# Patient Record
Sex: Female | Born: 1945 | Race: Black or African American | Hispanic: No | Marital: Married | Smoking: Former smoker
Health system: Southern US, Community
[De-identification: ages and names within clinical notes are randomized; demographics above are authoritative.]

## PROBLEM LIST (undated history)

## (undated) DIAGNOSIS — I10 Essential (primary) hypertension: Secondary | ICD-10-CM

## (undated) DIAGNOSIS — F329 Major depressive disorder, single episode, unspecified: Secondary | ICD-10-CM

## (undated) DIAGNOSIS — E559 Vitamin D deficiency, unspecified: Secondary | ICD-10-CM

## (undated) DIAGNOSIS — N390 Urinary tract infection, site not specified: Secondary | ICD-10-CM

## (undated) DIAGNOSIS — J302 Other seasonal allergic rhinitis: Secondary | ICD-10-CM

## (undated) DIAGNOSIS — J309 Allergic rhinitis, unspecified: Secondary | ICD-10-CM

## (undated) DIAGNOSIS — M329 Systemic lupus erythematosus, unspecified: Secondary | ICD-10-CM

## (undated) DIAGNOSIS — K219 Gastro-esophageal reflux disease without esophagitis: Secondary | ICD-10-CM

## (undated) DIAGNOSIS — M1990 Unspecified osteoarthritis, unspecified site: Secondary | ICD-10-CM

## (undated) DIAGNOSIS — N329 Bladder disorder, unspecified: Secondary | ICD-10-CM

## (undated) DIAGNOSIS — D649 Anemia, unspecified: Secondary | ICD-10-CM

## (undated) DIAGNOSIS — C801 Malignant (primary) neoplasm, unspecified: Secondary | ICD-10-CM

## (undated) DIAGNOSIS — F32A Depression, unspecified: Secondary | ICD-10-CM

## (undated) DIAGNOSIS — K760 Fatty (change of) liver, not elsewhere classified: Secondary | ICD-10-CM

## (undated) HISTORY — DX: Bladder disorder, unspecified: N32.9

## (undated) HISTORY — PX: ABDOMINAL HYSTERECTOMY: SHX81

## (undated) HISTORY — DX: Gastro-esophageal reflux disease without esophagitis: K21.9

## (undated) HISTORY — DX: Fatty (change of) liver, not elsewhere classified: K76.0

## (undated) HISTORY — DX: Malignant (primary) neoplasm, unspecified: C80.1

## (undated) HISTORY — DX: Allergic rhinitis, unspecified: J30.9

## (undated) HISTORY — DX: Unspecified osteoarthritis, unspecified site: M19.90

## (undated) HISTORY — PX: WHIPPLE PROCEDURE: SHX2667

## (undated) HISTORY — DX: Systemic lupus erythematosus, unspecified: M32.9

## (undated) HISTORY — DX: Other seasonal allergic rhinitis: J30.2

## (undated) HISTORY — DX: Anemia, unspecified: D64.9

## (undated) HISTORY — DX: Vitamin D deficiency, unspecified: E55.9

---
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
## 1998-04-03 ENCOUNTER — Ambulatory Visit (HOSPITAL_COMMUNITY): Admission: RE | Admit: 1998-04-03 | Discharge: 1998-04-03 | Payer: Self-pay | Admitting: Obstetrics

## 2000-03-24 ENCOUNTER — Other Ambulatory Visit: Admission: RE | Admit: 2000-03-24 | Discharge: 2000-03-24 | Payer: Self-pay | Admitting: Obstetrics

## 2000-04-02 ENCOUNTER — Ambulatory Visit (HOSPITAL_COMMUNITY): Admission: RE | Admit: 2000-04-02 | Discharge: 2000-04-02 | Payer: Self-pay | Admitting: Obstetrics

## 2000-04-02 ENCOUNTER — Encounter: Payer: Self-pay | Admitting: Obstetrics

## 2001-05-13 ENCOUNTER — Encounter: Payer: Self-pay | Admitting: Obstetrics

## 2001-05-13 ENCOUNTER — Ambulatory Visit (HOSPITAL_COMMUNITY): Admission: RE | Admit: 2001-05-13 | Discharge: 2001-05-13 | Payer: Self-pay | Admitting: Obstetrics

## 2002-05-19 ENCOUNTER — Ambulatory Visit (HOSPITAL_COMMUNITY): Admission: RE | Admit: 2002-05-19 | Discharge: 2002-05-19 | Payer: Self-pay | Admitting: Obstetrics

## 2002-05-19 ENCOUNTER — Encounter: Payer: Self-pay | Admitting: Obstetrics

## 2003-08-08 ENCOUNTER — Encounter: Payer: Self-pay | Admitting: Obstetrics

## 2003-08-08 ENCOUNTER — Ambulatory Visit (HOSPITAL_COMMUNITY): Admission: RE | Admit: 2003-08-08 | Discharge: 2003-08-08 | Payer: Self-pay | Admitting: Obstetrics

## 2004-08-08 ENCOUNTER — Ambulatory Visit (HOSPITAL_COMMUNITY): Admission: RE | Admit: 2004-08-08 | Discharge: 2004-08-08 | Payer: Self-pay | Admitting: Obstetrics

## 2004-08-27 ENCOUNTER — Ambulatory Visit: Payer: Self-pay | Admitting: Gastroenterology

## 2005-01-18 ENCOUNTER — Ambulatory Visit (HOSPITAL_COMMUNITY): Admission: RE | Admit: 2005-01-18 | Discharge: 2005-01-18 | Payer: Self-pay | Admitting: Urology

## 2005-01-18 ENCOUNTER — Ambulatory Visit (HOSPITAL_BASED_OUTPATIENT_CLINIC_OR_DEPARTMENT_OTHER): Admission: RE | Admit: 2005-01-18 | Discharge: 2005-01-18 | Payer: Self-pay | Admitting: Urology

## 2005-01-23 ENCOUNTER — Encounter: Admission: RE | Admit: 2005-01-23 | Discharge: 2005-01-23 | Payer: Self-pay | Admitting: Urology

## 2005-08-09 ENCOUNTER — Ambulatory Visit (HOSPITAL_COMMUNITY): Admission: RE | Admit: 2005-08-09 | Discharge: 2005-08-09 | Payer: Self-pay | Admitting: Obstetrics

## 2005-09-05 ENCOUNTER — Encounter: Admission: RE | Admit: 2005-09-05 | Discharge: 2005-09-05 | Payer: Self-pay | Admitting: Neurology

## 2005-09-18 ENCOUNTER — Encounter: Admission: RE | Admit: 2005-09-18 | Discharge: 2005-09-18 | Payer: Self-pay | Admitting: Neurology

## 2006-08-26 ENCOUNTER — Ambulatory Visit (HOSPITAL_COMMUNITY): Admission: RE | Admit: 2006-08-26 | Discharge: 2006-08-26 | Payer: Self-pay | Admitting: Obstetrics

## 2007-09-16 ENCOUNTER — Ambulatory Visit (HOSPITAL_COMMUNITY): Admission: RE | Admit: 2007-09-16 | Discharge: 2007-09-16 | Payer: Self-pay | Admitting: Obstetrics

## 2008-09-22 ENCOUNTER — Ambulatory Visit (HOSPITAL_COMMUNITY): Admission: RE | Admit: 2008-09-22 | Discharge: 2008-09-22 | Payer: Self-pay | Admitting: Obstetrics

## 2008-09-28 ENCOUNTER — Encounter: Admission: RE | Admit: 2008-09-28 | Discharge: 2008-09-28 | Payer: Self-pay | Admitting: Obstetrics

## 2009-09-27 ENCOUNTER — Ambulatory Visit (HOSPITAL_COMMUNITY): Admission: RE | Admit: 2009-09-27 | Discharge: 2009-09-27 | Payer: Self-pay | Admitting: Obstetrics

## 2010-10-01 ENCOUNTER — Ambulatory Visit (HOSPITAL_COMMUNITY)
Admission: RE | Admit: 2010-10-01 | Discharge: 2010-10-01 | Payer: Self-pay | Source: Home / Self Care | Attending: Internal Medicine | Admitting: Internal Medicine

## 2010-10-21 HISTORY — PX: ROTATOR CUFF REPAIR: SHX139

## 2010-11-11 ENCOUNTER — Encounter: Payer: Self-pay | Admitting: Obstetrics

## 2011-02-10 ENCOUNTER — Inpatient Hospital Stay (INDEPENDENT_AMBULATORY_CARE_PROVIDER_SITE_OTHER)
Admission: RE | Admit: 2011-02-10 | Discharge: 2011-02-10 | Disposition: A | Discharge status: Home / Self Care | Payer: BC Managed Care – PPO | Source: Ambulatory Visit | Attending: Emergency Medicine | Admitting: Emergency Medicine

## 2011-02-10 DIAGNOSIS — S60459A Superficial foreign body of unspecified finger, initial encounter: Secondary | ICD-10-CM

## 2011-03-08 NOTE — Op Note (Signed)
Kimberly Vazquez, Kimberly Vazquez            ACCOUNT NO.:  [id number]   MEDICAL RECORD NO.:  [id number]          PATIENT TYPE:  AMB   LOCATION:  [facility]                         FACILITY:  [facility]   PHYSICIAN:  Lindaann Slough, M.D.  DATE OF BIRTH:  [birth year]   DATE OF PROCEDURE:  01/18/2005  DATE OF DISCHARGE:                                 OPERATIVE REPORT   PREOPERATIVE DIAGNOSIS:  Urinary retention.   POSTOPERATIVE DIAGNOSIS:  Urinary retention.   OPERATIVE PROCEDURE:  Cystoscopy and meatal dilation.   SURGEON:  Lindaann Slough, M.D.   ANESTHESIA:  General.   INDICATIONS FOR PROCEDURE:  The patient is a 65 year old female who had been  complaining of frequency and voiding small amount of urine at a time.  Pelvic ultrasound done at her gynecologist's office showed a distended  bladder.  She was seen in the office and catheterized for 350 mL of urine.  BUN and creatinine are normal.  Renal ultrasound shows normal kidneys.  Then, she returned to the office two days later and was still having the  same symptoms.  Bladder ultrasound then showed a postvoid residual of 1000  mL, and she was then catheterized for 1200 mL of urine.  She is scheduled  today for cystoscopy and meatal dilation.   DESCRIPTION OF PROCEDURE:  Under general anesthesia, the patient was prepped  and draped and placed in the dorsal lithotomy position.  A #22 [place]  cystoscope was inserted in the bladder.  The bladder mucosa is normal.  There is no stone or tumor in the bladder.  The ureteral orifices are in  normal position and shape with clear efflux.  There is no evidence of  submucosal hemorrhage.  The cystoscope was then removed.  The urethra was  then dilated with #32 [place].   Bimanual examination showed no evidence of pelvic mass.  She is status post  hysterectomy.  There is no adnexal mass.  On rectal examination, there is no  rectal mass.   The patient tolerated the procedure well and left the OR in  satisfactory  condition to post anesthesia care unit.   PLAN:  Plan is to have the patient return to the office in two days to check  her postvoid residual.  We will also start her on Urecholine 50 mg four  times a day.  If postvoid residual urine is still elevated, we will teach  her how to do in-and-out catheterization and we would then do video  urodynamic studies.      MN/MEDQ  D:  01/18/2005  T:  01/18/2005  Job:  [id number]   cc:   Margaretmary Bayley, M.D.  [address], [address]  [place]  [place] [address]  Fax: [phone]   Kathreen Cosier, M.D.  [address]  [place]  [place] [address]  Fax: [phone]

## 2011-09-02 ENCOUNTER — Other Ambulatory Visit (HOSPITAL_COMMUNITY): Payer: Self-pay | Admitting: Internal Medicine

## 2011-09-02 DIAGNOSIS — Z1231 Encounter for screening mammogram for malignant neoplasm of breast: Secondary | ICD-10-CM

## 2011-10-03 ENCOUNTER — Ambulatory Visit (HOSPITAL_COMMUNITY)
Admission: RE | Admit: 2011-10-03 | Discharge: 2011-10-03 | Disposition: A | Discharge status: Home / Self Care | Payer: Medicare Other | Source: Ambulatory Visit | Attending: Internal Medicine | Admitting: Internal Medicine

## 2011-10-03 DIAGNOSIS — Z1231 Encounter for screening mammogram for malignant neoplasm of breast: Secondary | ICD-10-CM | POA: Insufficient documentation

## 2011-10-08 ENCOUNTER — Other Ambulatory Visit: Payer: Self-pay | Admitting: Internal Medicine

## 2011-10-08 ENCOUNTER — Encounter: Payer: Self-pay | Admitting: Gastroenterology

## 2011-10-08 ENCOUNTER — Ambulatory Visit
Admission: RE | Admit: 2011-10-08 | Discharge: 2011-10-08 | Disposition: A | Discharge status: Home / Self Care | Payer: Medicare Other | Source: Ambulatory Visit | Attending: Internal Medicine | Admitting: Internal Medicine

## 2011-10-08 DIAGNOSIS — J029 Acute pharyngitis, unspecified: Secondary | ICD-10-CM

## 2011-10-08 DIAGNOSIS — R059 Cough, unspecified: Secondary | ICD-10-CM

## 2011-10-08 DIAGNOSIS — R05 Cough: Secondary | ICD-10-CM

## 2012-04-10 ENCOUNTER — Other Ambulatory Visit: Payer: Self-pay | Admitting: *Deleted

## 2012-09-22 ENCOUNTER — Other Ambulatory Visit (HOSPITAL_COMMUNITY): Payer: Self-pay | Admitting: Internal Medicine

## 2012-09-22 DIAGNOSIS — Z1231 Encounter for screening mammogram for malignant neoplasm of breast: Secondary | ICD-10-CM

## 2012-10-09 ENCOUNTER — Ambulatory Visit (HOSPITAL_COMMUNITY)
Admission: RE | Admit: 2012-10-09 | Discharge: 2012-10-09 | Disposition: A | Discharge status: Home / Self Care | Payer: Medicare Other | Source: Ambulatory Visit | Attending: Internal Medicine | Admitting: Internal Medicine

## 2012-10-09 DIAGNOSIS — Z1231 Encounter for screening mammogram for malignant neoplasm of breast: Secondary | ICD-10-CM

## 2013-10-09 ENCOUNTER — Encounter (HOSPITAL_COMMUNITY): Payer: Self-pay | Admitting: Emergency Medicine

## 2013-10-09 ENCOUNTER — Emergency Department (HOSPITAL_COMMUNITY)
Admission: EM | Admit: 2013-10-09 | Discharge: 2013-10-09 | Disposition: A | Discharge status: Home / Self Care | Payer: PRIVATE HEALTH INSURANCE | Attending: Emergency Medicine | Admitting: Emergency Medicine

## 2013-10-09 DIAGNOSIS — Z7982 Long term (current) use of aspirin: Secondary | ICD-10-CM | POA: Insufficient documentation

## 2013-10-09 DIAGNOSIS — N390 Urinary tract infection, site not specified: Secondary | ICD-10-CM | POA: Insufficient documentation

## 2013-10-09 DIAGNOSIS — I10 Essential (primary) hypertension: Secondary | ICD-10-CM | POA: Insufficient documentation

## 2013-10-09 DIAGNOSIS — Z79899 Other long term (current) drug therapy: Secondary | ICD-10-CM | POA: Insufficient documentation

## 2013-10-09 HISTORY — DX: Urinary tract infection, site not specified: N39.0

## 2013-10-09 HISTORY — DX: Essential (primary) hypertension: I10

## 2013-10-09 LAB — CBC WITH DIFFERENTIAL/PLATELET
Basophils Relative: 0 % (ref 0–1)
Eosinophils Absolute: 0 10*3/uL (ref 0.0–0.7)
Eosinophils Relative: 0 % (ref 0–5)
HCT: 41.7 % (ref 36.0–46.0)
MCHC: 34.1 g/dL (ref 30.0–36.0)
MCV: 80 fL (ref 78.0–100.0)
Monocytes Relative: 6 % (ref 3–12)
Neutro Abs: 6.1 10*3/uL (ref 1.7–7.7)
Neutrophils Relative %: 78 % — ABNORMAL HIGH (ref 43–77)
Platelets: 265 10*3/uL (ref 150–400)
RBC: 5.21 MIL/uL — ABNORMAL HIGH (ref 3.87–5.11)
RDW: 14.2 % (ref 11.5–15.5)

## 2013-10-09 LAB — COMPREHENSIVE METABOLIC PANEL
ALT: 318 U/L — ABNORMAL HIGH (ref 0–35)
AST: 243 U/L — ABNORMAL HIGH (ref 0–37)
Alkaline Phosphatase: 107 U/L (ref 39–117)
BUN: 35 mg/dL — ABNORMAL HIGH (ref 6–23)
CO2: 27 mEq/L (ref 19–32)
Calcium: 9.8 mg/dL (ref 8.4–10.5)
Creatinine, Ser: 1.69 mg/dL — ABNORMAL HIGH (ref 0.50–1.10)
GFR calc Af Amer: 35 mL/min — ABNORMAL LOW (ref >90)
Sodium: 135 mEq/L (ref 135–145)
Total Bilirubin: 0.9 mg/dL (ref 0.3–1.2)
Total Protein: 8.5 g/dL — ABNORMAL HIGH (ref 6.0–8.3)

## 2013-10-09 LAB — URINALYSIS, ROUTINE W REFLEX MICROSCOPIC
Protein, ur: NEGATIVE mg/dL
Specific Gravity, Urine: 1.018 (ref 1.005–1.030)
Urobilinogen, UA: 0.2 mg/dL (ref 0.0–1.0)

## 2013-10-09 LAB — LIPASE, BLOOD: Lipase: 13 U/L (ref 11–59)

## 2013-10-09 LAB — URINE MICROSCOPIC-ADD ON

## 2013-10-09 MED ORDER — DEXTROSE 5 % IV SOLN
1.0000 g | Freq: Once | INTRAVENOUS | Status: AC
  Administered 2013-10-09: 1 g via INTRAVENOUS
  Filled 2013-10-09: qty 10

## 2013-10-09 MED ORDER — CEPHALEXIN 500 MG PO CAPS: 500.0000 mg | ORAL_CAPSULE | Freq: Three times a day (TID) | ORAL | Status: DC

## 2013-10-09 MED ORDER — ONDANSETRON HCL 4 MG PO TABS: 4.0000 mg | ORAL_TABLET | Freq: Three times a day (TID) | ORAL | Status: DC | PRN

## 2013-10-09 MED ORDER — ONDANSETRON HCL 4 MG/2ML IJ SOLN
4.0000 mg | Freq: Once | INTRAMUSCULAR | Status: AC
  Administered 2013-10-09: 4 mg via INTRAVENOUS
  Filled 2013-10-09: qty 2

## 2013-10-09 MED ORDER — SODIUM CHLORIDE 0.9 % IV BOLUS (SEPSIS)
1000.0000 mL | Freq: Once | INTRAVENOUS | Status: AC
  Administered 2013-10-09: 1000 mL via INTRAVENOUS

## 2013-10-09 NOTE — ED Notes (Signed)
Pt reports mid abd pain and n/v/d x 3 days. Reports recent UTI but reports doing self caths at home.

## 2013-10-09 NOTE — ED Notes (Signed)
Self-cath kit provided to patient.

## 2013-10-09 NOTE — ED Provider Notes (Signed)
CSN: [id number]     Arrival date & time 10/09/13  1310 History   First MD Initiated Contact with Patient 10/09/13 1409     Chief Complaint  Patient presents with  . Abdominal Pain   (Consider location/radiation/quality/duration/timing/severity/associated sxs/prior Treatment) Patient is a 67 y.o. female presenting with abdominal pain.  Abdominal Pain  Pt with history of 'lazy bladder' who self-caths at home and recent treated for UTI by PCP, states she has had 2 days of lower abdominal burning, nausea, general malaise and poor appetite. She denies any diarrhea. She also recently diagnosed with 'fatty liver' but not on any specific treatment for same.   Past Medical History  Diagnosis Date  . UTI (urinary tract infection)   . Hypertension    History reviewed. No pertinent past surgical history. History reviewed. No pertinent family history. History  Substance Use Topics  . Smoking status: Not on file  . Smokeless tobacco: Not on file  . Alcohol Use: No   OB History   Grav Para Term Preterm Abortions TAB SAB Ect Mult Living                 Review of Systems  Gastrointestinal: Positive for abdominal pain.   All other systems reviewed and are negative except as noted in HPI.   Allergies  Review of patient's allergies indicates no known allergies.  Home Medications   Current Outpatient Rx  Name  Route  Sig  Dispense  Refill  . aspirin 81 MG tablet   Oral   Take 81 mg by mouth daily.         . Cholecalciferol (VITAMIN D) 2000 UNITS CAPS   Oral   Take 1 capsule by mouth daily.         Marland Kitchen losartan (COZAAR) 100 MG tablet   Oral   Take 100 mg by mouth daily.          . nitrofurantoin (MACRODANTIN) 100 MG capsule   Oral   Take 100 mg by mouth daily.         . phentermine (ADIPEX-P) 37.5 MG tablet   Oral   Take 37.5 mg by mouth daily.          Marland Kitchen triamterene-hydrochlorothiazide (MAXZIDE-25) 37.5-25 MG per tablet   Oral   Take 1 tablet by mouth daily.          BP 142/78  Pulse 78  Temp(Src) 98.6 F (37 C) (Oral)  Resp 16  SpO2 95% Physical Exam  Nursing note and vitals reviewed. Constitutional: She is oriented to person, place, and time. She appears well-developed and well-nourished.  HENT:  Head: Normocephalic and atraumatic.  Eyes: EOM are normal. Pupils are equal, round, and reactive to light.  Neck: Normal range of motion. Neck supple.  Cardiovascular: Normal rate, normal heart sounds and intact distal pulses.   Pulmonary/Chest: Effort normal and breath sounds normal.  Abdominal: Bowel sounds are normal. She exhibits no distension. There is tenderness (lower abdominal, mild). There is no rebound and no guarding.  Musculoskeletal: Normal range of motion. She exhibits no edema and no tenderness.  Neurological: She is alert and oriented to person, place, and time. She has normal strength. No cranial nerve deficit or sensory deficit.  Skin: Skin is warm and dry. No rash noted.  Psychiatric: She has a normal mood and affect.    ED Course  Procedures (including critical care time) Labs Review Labs Reviewed  CBC WITH DIFFERENTIAL - Abnormal; Notable for the following:  RBC 5.21 (*)    Neutrophils Relative % 78 (*)    All other components within normal limits  COMPREHENSIVE METABOLIC PANEL - Abnormal; Notable for the following:    Glucose, Bld 103 (*)    BUN 35 (*)    Creatinine, Ser 1.69 (*)    Total Protein 8.5 (*)    AST 243 (*)    ALT 318 (*)    GFR calc non Af Amer 30 (*)    GFR calc Af Amer 35 (*)    All other components within normal limits  URINALYSIS, ROUTINE W REFLEX MICROSCOPIC - Abnormal; Notable for the following:    APPearance CLOUDY (*)    Hgb urine dipstick SMALL (*)    Nitrite POSITIVE (*)    Leukocytes, UA SMALL (*)    All other components within normal limits  URINE MICROSCOPIC-ADD ON - Abnormal; Notable for the following:    Bacteria, UA MANY (*)    Casts GRANULAR CAST (*)    All other components  within normal limits  URINE CULTURE  LIPASE, BLOOD   Imaging Review No results found.  EKG Interpretation   None       MDM   1. UTI (urinary tract infection)     Labs consistent with UTI, mildly elevated LFTs consistent with reported 'fatty liver'. Feeling much better after IVF and Zofran. Will give Rocephin, treat with Keflex, send UA for culture. ADvised PCP followup.     Rufus Cypert B. Bernette Mayers, MD 10/09/13 [id number]

## 2013-10-12 LAB — URINE CULTURE

## 2013-10-18 ENCOUNTER — Other Ambulatory Visit (HOSPITAL_COMMUNITY): Payer: Self-pay | Admitting: Internal Medicine

## 2013-10-18 DIAGNOSIS — Z1231 Encounter for screening mammogram for malignant neoplasm of breast: Secondary | ICD-10-CM

## 2013-10-28 ENCOUNTER — Ambulatory Visit (HOSPITAL_COMMUNITY)
Admission: RE | Admit: 2013-10-28 | Discharge: 2013-10-28 | Disposition: A | Discharge status: Home / Self Care | Payer: PRIVATE HEALTH INSURANCE | Source: Ambulatory Visit | Attending: Internal Medicine | Admitting: Internal Medicine

## 2013-10-28 DIAGNOSIS — Z1231 Encounter for screening mammogram for malignant neoplasm of breast: Secondary | ICD-10-CM

## 2013-11-10 ENCOUNTER — Encounter: Payer: Self-pay | Admitting: Gastroenterology

## 2013-11-12 ENCOUNTER — Telehealth: Payer: Self-pay | Admitting: Gastroenterology

## 2013-11-12 NOTE — Telephone Encounter (Signed)
Rec'd from [facility] forward 21 pages to Dr.Stark

## 2013-12-01 ENCOUNTER — Ambulatory Visit (INDEPENDENT_AMBULATORY_CARE_PROVIDER_SITE_OTHER): Payer: Medicare Other | Admitting: Gastroenterology

## 2013-12-01 ENCOUNTER — Encounter: Payer: Self-pay | Admitting: Gastroenterology

## 2013-12-01 VITALS — BP 118/64 | HR 70 | Ht 61.5 in | Wt 186.6 lb

## 2013-12-01 DIAGNOSIS — Z1211 Encounter for screening for malignant neoplasm of colon: Secondary | ICD-10-CM

## 2013-12-01 DIAGNOSIS — K5900 Constipation, unspecified: Secondary | ICD-10-CM

## 2013-12-01 MED ORDER — PEG-KCL-NACL-NASULF-NA ASC-C 100 G PO SOLR: 1.0000 | Freq: Once | ORAL | Status: DC

## 2013-12-01 NOTE — Progress Notes (Signed)
History of Present Illness: This is a 68 year old female who has occasional problems with constipation. Recently she began increasing her water intake and using a stool softener and this has helped. She had problems with lower abdominal pain in December and was found to have UTI which was treated and her pain resolved. She previously underwent colonoscopy in November 2005 showing diverticulosis and a small hyperplastic colon polyp. Denies weight loss, diarrhea, change in stool caliber, melena, hematochezia, nausea, vomiting, dysphagia, reflux symptoms, chest pain.  No Known Allergies Outpatient Prescriptions Prior to Visit  Medication Sig Dispense Refill  . losartan (COZAAR) 100 MG tablet Take 100 mg by mouth daily.       . phentermine (ADIPEX-P) 37.5 MG tablet Take 37.5 mg by mouth daily.       Marland Kitchen triamterene-hydrochlorothiazide (MAXZIDE-25) 37.5-25 MG per tablet Take 1 tablet by mouth daily.      Marland Kitchen aspirin 81 MG tablet Take 81 mg by mouth daily.      . cephALEXin (KEFLEX) 500 MG capsule Take 1 capsule (500 mg total) by mouth 3 (three) times daily.  21 capsule  0  . Cholecalciferol (VITAMIN D) 2000 UNITS CAPS Take 1 capsule by mouth daily.      . nitrofurantoin (MACRODANTIN) 100 MG capsule Take 100 mg by mouth daily.      . ondansetron (ZOFRAN) 4 MG tablet Take 1 tablet (4 mg total) by mouth every 8 (eight) hours as needed for nausea or vomiting.  12 tablet  0   No facility-administered medications prior to visit.   Past Medical History  Diagnosis Date  . UTI (urinary tract infection)   . Hypertension   . Arthritis   . Vitamin D deficiency   . Allergic rhinitis   . Fatty liver   . Anemia   . Bladder disorder     "lazy" , caths twice a day   Past Surgical History  Procedure Laterality Date  . Abdominal hysterectomy    . Thyroidectomy     History   Social History  . Marital Status: Married    Spouse Name: N/A    Number of Children: 2  . Years of Education: N/A    Occupational History  . Retired [employer] Professor    Social History Main Topics  . Smoking status: Former Smoker -- 22 years    Types: Cigarettes    Quit date: 03/21/2002  . Smokeless tobacco: Never Used  . Alcohol Use: Yes     Comment: rare  . Drug Use: No  . Sexual Activity: None   Other Topics Concern  . None   Social History Narrative  . None   Family History  Problem Relation Age of Onset  . Diabetes Brother   . Kidney disease Son   . Colon cancer Neg Hx      Review of Systems: Pertinent positive and negative review of systems were noted in the above HPI section. All other review of systems were otherwise negative.   Physical Exam: General: Well developed , well nourished, no acute distress Head: Normocephalic and atraumatic Eyes:  sclerae anicteric, EOMI Ears: Normal auditory acuity Mouth: No deformity or lesions Neck: Supple, no masses or thyromegaly Lungs: Clear throughout to auscultation Heart: Regular rate and rhythm; no murmurs, rubs or bruits Abdomen: Soft, non tender and non distended. No masses, hepatosplenomegaly or hernias noted. Normal Bowel sounds Rectal: deferred to colonoscopy Musculoskeletal: Symmetrical with no gross deformities  Skin: No lesions on visible extremities Pulses:  Normal pulses noted Extremities: No clubbing, cyanosis, edema or deformities noted Neurological: Alert oriented x 4, grossly nonfocal Cervical Nodes:  No significant cervical adenopathy Inguinal Nodes: No significant inguinal adenopathy Psychological:  Alert and cooperative. Normal mood and affect  Assessment and Recommendations:  1. Colorectal cancer screening, average risk. The risks, benefits, and alternatives to colonoscopy with possible biopsy and possible polypectomy were discussed with the patient and they consent to proceed.   2. Mild constipation. Continue a high fiber diet with adequate daily water intake. Continue a daily stool softener. Add MiraLax  once or twice daily for constipation if her constipation is not adequately addressed.

## 2013-12-01 NOTE — Patient Instructions (Signed)
You have been scheduled for a colonoscopy with propofol. Please follow written instructions given to you at your visit today.  Please pick up your prep kit at the pharmacy within the next 1-3 days. If you use inhalers (even only as needed), please bring them with you on the day of your procedure.  Thank you for choosing me and [facility].  Pricilla Riffle. Dagoberto Ligas., MD., Marval Regal  cc: Willey Blade, MD

## 2013-12-02 ENCOUNTER — Encounter: Payer: Self-pay | Admitting: Gastroenterology

## 2014-01-27 ENCOUNTER — Encounter: Payer: Medicare Other | Admitting: Gastroenterology

## 2014-02-04 ENCOUNTER — Ambulatory Visit (AMBULATORY_SURGERY_CENTER): Payer: Self-pay | Admitting: *Deleted

## 2014-02-04 VITALS — Ht 61.5 in | Wt 189.4 lb

## 2014-02-04 DIAGNOSIS — Z1211 Encounter for screening for malignant neoplasm of colon: Secondary | ICD-10-CM

## 2014-02-04 NOTE — Progress Notes (Signed)
No allergies to eggs or soy. No problems with anesthesia.  Pt given Emmi instructions for colonoscopy  No oxygen use  Pt stopped Phentermine 1 month ago.  Pt instructed not to start back until after procedure

## 2014-02-08 ENCOUNTER — Encounter: Payer: Self-pay | Admitting: Gastroenterology

## 2014-02-08 ENCOUNTER — Ambulatory Visit (AMBULATORY_SURGERY_CENTER): Payer: Medicare Other | Admitting: Gastroenterology

## 2014-02-08 VITALS — BP 126/70 | HR 77 | Temp 99.3°F | Resp 14 | Ht 61.5 in | Wt 189.0 lb

## 2014-02-08 DIAGNOSIS — Z1211 Encounter for screening for malignant neoplasm of colon: Secondary | ICD-10-CM

## 2014-02-08 DIAGNOSIS — D126 Benign neoplasm of colon, unspecified: Secondary | ICD-10-CM

## 2014-02-08 MED ORDER — SODIUM CHLORIDE 0.9 % IV SOLN: 500.0000 mL | INTRAVENOUS | Status: DC

## 2014-02-08 NOTE — Progress Notes (Signed)
Called to room to assist during endoscopic procedure.  Patient ID and intended procedure confirmed with present staff. Received instructions for my participation in the procedure from the performing physician.  

## 2014-02-08 NOTE — Patient Instructions (Signed)
YOU HAD AN ENDOSCOPIC PROCEDURE TODAY AT THE [facility]: Refer to the procedure report that was given to you for any specific questions about what was found during the examination.  If the procedure report does not answer your questions, please call your gastroenterologist to clarify.  If you requested that your care partner not be given the details of your procedure findings, then the procedure report has been included in a sealed envelope for you to review at your convenience later.  YOU SHOULD EXPECT: Some feelings of bloating in the abdomen. Passage of more gas than usual.  Walking can help get rid of the air that was put into your GI tract during the procedure and reduce the bloating. If you had a lower endoscopy (such as a colonoscopy or flexible sigmoidoscopy) you may notice spotting of blood in your stool or on the toilet paper. If you underwent a bowel prep for your procedure, then you may not have a normal bowel movement for a few days.  DIET: Your first meal following the procedure should be a light meal and then it is ok to progress to your normal diet.  A half-sandwich or bowl of soup is an example of a good first meal.  Heavy or fried foods are harder to digest and may make you feel nauseous or bloated.  Likewise meals heavy in dairy and vegetables can cause extra gas to form and this can also increase the bloating.  Drink plenty of fluids but you should avoid alcoholic beverages for 24 hours.  ACTIVITY: Your care partner should take you home directly after the procedure.  You should plan to take it easy, moving slowly for the rest of the day.  You can resume normal activity the day after the procedure however you should NOT DRIVE or use heavy machinery for 24 hours (because of the sedation medicines used during the test).    SYMPTOMS TO REPORT IMMEDIATELY: A gastroenterologist can be reached at any hour.  During normal business hours, 8:30 AM to 5:00 PM Monday through Friday,  call [phone].  After hours and on weekends, please call the GI answering service at [phone] who will take a message and have the physician on call contact you.   Following lower endoscopy (colonoscopy or flexible sigmoidoscopy):  Excessive amounts of blood in the stool  Significant tenderness or worsening of abdominal pains  Swelling of the abdomen that is new, acute  Fever of 100F or higher    FOLLOW UP: If any biopsies were taken you will be contacted by phone or by letter within the next 1-3 weeks.  Call your gastroenterologist if you have not heard about the biopsies in 3 weeks.  Our staff will call the home number listed on your records the next business day following your procedure to check on you and address any questions or concerns that you may have at that time regarding the information given to you following your procedure. This is a courtesy call and so if there is no answer at the home number and we have not heard from you through the emergency physician on call, we will assume that you have returned to your regular daily activities without incident.  SIGNATURES/CONFIDENTIALITY: You and/or your care partner have signed paperwork which will be entered into your electronic medical record.  These signatures attest to the fact that that the information above on your After Visit Summary has been reviewed and is understood.  Full responsibility of the confidentiality   of this discharge information lies with you and/or your care-partner.    Information on polyps given to you today 

## 2014-02-08 NOTE — Op Note (Signed)
[facility]  [address] [place], [address]   COLONOSCOPY PROCEDURE REPORT  PATIENT: Kimberly Vazquez, Kimberly Vazquez  MR#: [id number] BIRTHDATE: [birth year] , 77  yrs. old GENDER: Female ENDOSCOPIST: Ladene Artist, MD, [facility] REFERRED Edwinna Areola, M.D. PROCEDURE DATE:  02/08/2014 PROCEDURE:   Colonoscopy with snare polypectomy First Screening Colonoscopy - Avg.  risk and is 50 yrs.  old or older - No.  Prior Negative Screening - Now for repeat screening. 10 or more years since last screening  History of Adenoma - Now for follow-up colonoscopy & has been > or = to 3 yrs.  N/A  Polyps Removed Today? Yes. ASA CLASS:   Class II INDICATIONS:average risk screening. MEDICATIONS: MAC sedation, administered by CRNA and propofol (Diprivan) 150mg  IV DESCRIPTION OF PROCEDURE:   After the risks benefits and alternatives of the procedure were thoroughly explained, informed consent was obtained.  A digital rectal exam revealed no abnormalities of the rectum.   The LB [id number]  endoscope was introduced through the anus and advanced to the cecum, which was identified by both the appendix and ileocecal valve. No adverse events experienced.   The quality of the prep was excellent, using MoviPrep  The instrument was then slowly withdrawn as the colon was fully examined.  COLON FINDINGS: A sessile polyp measuring 5 mm in size was found in the sigmoid colon.  A polypectomy was performed with a cold snare. The resection was complete and the polyp tissue was completely retrieved.   The colon was otherwise normal.  There was no diverticulosis, inflammation, polyps or cancers unless previously stated.  Retroflexed views revealed no abnormalities. The time to cecum=3 minutes 25 seconds.  Withdrawal time=10 minutes 33 seconds. The scope was withdrawn and the procedure completed.  COMPLICATIONS: There were no complications.  ENDOSCOPIC IMPRESSION: 1.   Sessile polyp measuring 5 mm in the  sigmoid colon; polypectomy performed with a cold snare 2.   The colon was otherwise normal  RECOMMENDATIONS: 1.  Await pathology results 2.  Repeat colonoscopy in 5 years if polyp adenomatous; otherwise 10 years  eSigned:  Ladene Artist, MD, [facility] 02/08/2014 10:01 AM

## 2014-02-09 ENCOUNTER — Telehealth: Payer: Self-pay | Admitting: *Deleted

## 2014-02-09 NOTE — Telephone Encounter (Signed)
  Follow up Call-  Call back number 02/08/2014  Post procedure Call Back phone  # [phone]  Permission to leave phone message Yes     Patient questions:  Do you have a fever, pain , or abdominal swelling? no Pain Score  0 *  Have you tolerated food without any problems? yes  Have you been able to return to your normal activities? yes  Do you have any questions about your discharge instructions: Diet   no Medications  no Follow up visit  no  Do you have questions or concerns about your Care? no  Actions: * If pain score is 4 or above: No action needed, pain <4.

## 2014-02-14 ENCOUNTER — Encounter: Payer: Self-pay | Admitting: Gastroenterology

## 2014-06-09 ENCOUNTER — Telehealth: Payer: Self-pay | Admitting: Hematology and Oncology

## 2014-06-09 NOTE — Telephone Encounter (Signed)
S/W PATIENT AND GAVE NP APPT 08/31 @ 11 W/DR. Teller.

## 2014-06-20 ENCOUNTER — Telehealth: Payer: Self-pay

## 2014-06-20 ENCOUNTER — Ambulatory Visit (HOSPITAL_BASED_OUTPATIENT_CLINIC_OR_DEPARTMENT_OTHER): Payer: Medicare Other | Admitting: Hematology and Oncology

## 2014-06-20 ENCOUNTER — Ambulatory Visit (HOSPITAL_BASED_OUTPATIENT_CLINIC_OR_DEPARTMENT_OTHER): Payer: 59

## 2014-06-20 ENCOUNTER — Encounter: Payer: Self-pay | Admitting: Hematology and Oncology

## 2014-06-20 ENCOUNTER — Telehealth: Payer: Self-pay | Admitting: Hematology and Oncology

## 2014-06-20 VITALS — BP 152/82 | HR 98 | Temp 97.9°F | Resp 18 | Ht 61.5 in | Wt 189.7 lb

## 2014-06-20 DIAGNOSIS — N182 Chronic kidney disease, stage 2 (mild): Secondary | ICD-10-CM

## 2014-06-20 DIAGNOSIS — D472 Monoclonal gammopathy: Secondary | ICD-10-CM | POA: Insufficient documentation

## 2014-06-20 DIAGNOSIS — M359 Systemic involvement of connective tissue, unspecified: Secondary | ICD-10-CM | POA: Insufficient documentation

## 2014-06-20 DIAGNOSIS — E559 Vitamin D deficiency, unspecified: Secondary | ICD-10-CM

## 2014-06-20 DIAGNOSIS — M329 Systemic lupus erythematosus, unspecified: Secondary | ICD-10-CM

## 2014-06-20 NOTE — Progress Notes (Signed)
Checked in new patient with no financial issues prior to seeing the dr. She has appt card °

## 2014-06-20 NOTE — Assessment & Plan Note (Signed)
The patient is currently on hydroxychloroquine for treatment of systemic lupus. I recommend she continues the same.

## 2014-06-20 NOTE — Progress Notes (Signed)
[place] NOTE  Patient Care Team: Willey Blade, MD as PCP - General (Internal Medicine) Bo Merino, MD as Consulting Physician (Rheumatology) Heath Lark, MD as Consulting Physician (Hematology and Oncology)  CHIEF COMPLAINTS/PURPOSE OF CONSULTATION:  IgG kappa MGUS  HISTORY OF PRESENTING ILLNESS:  Kimberly Vazquez 68 y.o. female is here because of recently discovered IgG kappa MGUS. This patient has diffuse joint pain and was recently diagnosed with systemic lupus. She was on hydroxychloroquine a week ago. The patient is a high-dose vitamin D supplement for vitamin D deficiency. She has joint pain throughout her body, worse in the lumbar region, shoulders, hips and her right leg. The patient also had bladder prolapse with neurologic bladder and she self catheterize twice a day for the last 7 years. She denies history of abnormal bone pain or bone fracture. Patient denies history of recurrent infection or atypical infections such as shingles of meningitis. Denies chills, night sweats, anorexia or abnormal weight loss.  MEDICAL HISTORY:  Past Medical History  Diagnosis Date  . UTI (urinary tract infection)   . Hypertension   . Arthritis   . Vitamin D deficiency   . Allergic rhinitis   . Fatty liver   . Anemia   . Bladder disorder     "lazy" , caths twice a day  . GERD (gastroesophageal reflux disease)   . Seasonal allergies     SURGICAL HISTORY: Past Surgical History  Procedure Laterality Date  . Abdominal hysterectomy    . Tonsillectomy  1965  . Rotator cuff repair Right 2012    SOCIAL HISTORY: History   Social History  . Marital Status: Married    Spouse Name: N/A    Number of Children: 2  . Years of Education: N/A   Occupational History  . Retired [employer] Professor    Social History Main Topics  . Smoking status: Former Smoker -- 22 years    Types: Cigarettes    Quit date: 03/21/2002  . Smokeless tobacco: Never Used  .  Alcohol Use: Yes     Comment: rare  . Drug Use: No  . Sexual Activity: Not on file   Other Topics Concern  . Not on file   Social History Narrative  . No narrative on file    FAMILY HISTORY: Family History  Problem Relation Age of Onset  . Diabetes Brother   . Cancer Brother     unknown  . Kidney disease Son   . Colon cancer Neg Hx     ALLERGIES:  has No Known Allergies.  MEDICATIONS:  Current Outpatient Prescriptions  Medication Sig Dispense Refill  . aspirin 81 MG tablet Take 81 mg by mouth daily.      . chlorhexidine (PERIDEX) 0.12 % solution       . cholecalciferol (VITAMIN D) 1000 UNITS tablet Take 2,000 Units by mouth daily.      . hydroxychloroquine (PLAQUENIL) 200 MG tablet Take 200 mg by mouth 2 (two) times daily.       Marland Kitchen loratadine (CLARITIN) 10 MG tablet Take 10 mg by mouth daily as needed.       Marland Kitchen losartan (COZAAR) 100 MG tablet Take 100 mg by mouth daily.       . Multiple Vitamins-Minerals (ONE-A-DAY ENERGY PO) Take by mouth.      . nitrofurantoin (MACRODANTIN) 100 MG capsule Take 100 mg by mouth daily.      . Omega-3 Fatty Acids (OMEGA-3 FISH OIL PO) Take 2 capsules by mouth  daily.      . omeprazole (PRILOSEC) 20 MG capsule Take 20 mg by mouth as needed.       . phentermine (ADIPEX-P) 37.5 MG tablet       . PROCTOSOL HC 2.5 % rectal cream       . triamterene-hydrochlorothiazide (MAXZIDE-25) 37.5-25 MG per tablet Take 1 tablet by mouth daily.       No current facility-administered medications for this visit.    REVIEW OF SYSTEMS:   Eyes: Denies blurriness of vision, double vision or watery eyes Ears, nose, mouth, throat, and face: Denies mucositis or sore throat Respiratory: Denies cough, dyspnea or wheezes Cardiovascular: Denies palpitation, chest discomfort or lower extremity swelling Gastrointestinal:  Denies nausea, heartburn or change in bowel habits Skin: Denies abnormal skin rashes Lymphatics: Denies new lymphadenopathy or easy  bruising Neurological:Denies numbness, tingling or new weaknesses Behavioral/Psych: Mood is stable, no new changes  All other systems were reviewed with the patient and are negative.  PHYSICAL EXAMINATION: ECOG PERFORMANCE STATUS: 1 - Symptomatic but completely ambulatory  Filed Vitals:   06/20/14 1102  BP: 152/82  Pulse: 98  Temp: 97.9 F (36.6 C)  Resp: 18   Filed Weights   06/20/14 1102  Weight: 189 lb 11.2 oz (86.047 kg)    GENERAL:alert, no distress and comfortable. She is moderately obese SKIN: skin color, texture, turgor are normal, no rashes or significant lesions EYES: normal, conjunctiva are pink and non-injected, sclera clear OROPHARYNX:no exudate, no erythema and lips, buccal mucosa, and tongue normal  NECK: supple, thyroid normal size, non-tender, without nodularity LYMPH:  no palpable lymphadenopathy in the cervical, axillary or inguinal LUNGS: clear to auscultation and percussion with normal breathing effort HEART: regular rate & rhythm and no murmurs and no lower extremity edema ABDOMEN:abdomen soft, non-tender and normal bowel sounds Musculoskeletal:no cyanosis of digits and no clubbing  PSYCH: alert & oriented x 3 with fluent speech NEURO: no focal motor/sensory deficits  LABORATORY DATA:  I have reviewed the data as listed Lab Results  Component Value Date   WBC 7.8 10/09/2013   HGB 14.2 10/09/2013   HCT 41.7 10/09/2013   MCV 80.0 10/09/2013   PLT 265 10/09/2013   ASSESSMENT & PLAN:  MGUS (monoclonal gammopathy of unknown significance) I do not believe this is the cause of her joint pain. She has small amount of IgG kappa with no evidence of end organ damage. I will order blood work, 24-hour urine collection and skeletal survey to be done in 3 months   Lupus The patient is currently on hydroxychloroquine for treatment of systemic lupus. I recommend she continues the same.   . Orders Placed This Encounter  Procedures  . DG Bone Survey Met     Standing Status: Future     Number of Occurrences:      Standing Expiration Date: 08/20/2015    Order Specific Question:  Reason for Exam (SYMPTOM  OR DIAGNOSIS REQUIRED)    Answer:  staging myeloma    Order Specific Question:  Preferred imaging location?    Answer:  [facility]  . CBC with Differential    Standing Status: Future     Number of Occurrences:      Standing Expiration Date: 08/20/2015  . Comprehensive metabolic panel    Standing Status: Future     Number of Occurrences:      Standing Expiration Date: 08/20/2015  . Lactate dehydrogenase    Standing Status: Future     Number of Occurrences:  Standing Expiration Date: 08/20/2015  . SPEP & IFE with QIG    Standing Status: Future     Number of Occurrences:      Standing Expiration Date: 08/20/2015  . Protein Electro, 24-Hour Urine    Standing Status: Future     Number of Occurrences:      Standing Expiration Date: 08/20/2015  . Kappa/lambda light chains    Standing Status: Future     Number of Occurrences:      Standing Expiration Date: 08/20/2015  . Beta 2 microglobulin, serum    Standing Status: Future     Number of Occurrences:      Standing Expiration Date: 08/20/2015  . IFE, Urine (with Tot Prot)    Standing Status: Future     Number of Occurrences:      Standing Expiration Date: 08/20/2015    All questions were answered. The patient knows to call the clinic with any problems, questions or concerns. I spent 30 minutes counseling the patient face to face. The total time spent in the appointment was 40 minutes and more than 50% was on counseling.     [facility], [place], MD 06/20/2014 8:03 PM

## 2014-06-20 NOTE — Telephone Encounter (Signed)
Called and informed patient to empty first urine of the morning specimen when catheterizing, and then start the 24 hr urine specimen. Pt verbalized understanding and denies any questions or concerns at this time.

## 2014-06-20 NOTE — Telephone Encounter (Signed)
Pt confirmed labs/ov per 08/31 POF, gave pt AVS..Marland KitchenKJ

## 2014-06-20 NOTE — Assessment & Plan Note (Signed)
I do not believe this is the cause of her joint pain. She has small amount of IgG kappa with no evidence of end organ damage. I will order blood work, 24-hour urine collection and skeletal survey to be done in 3 months

## 2014-07-11 ENCOUNTER — Other Ambulatory Visit: Payer: Self-pay | Admitting: Internal Medicine

## 2014-07-11 DIAGNOSIS — R221 Localized swelling, mass and lump, neck: Secondary | ICD-10-CM

## 2014-07-15 ENCOUNTER — Other Ambulatory Visit: Payer: Medicare Other

## 2014-07-26 ENCOUNTER — Encounter: Payer: Self-pay | Admitting: *Deleted

## 2014-07-26 ENCOUNTER — Other Ambulatory Visit: Payer: Medicare Other

## 2014-07-26 NOTE — Progress Notes (Signed)
Clearance to start on Plaquenil signed by Dr. Alvy Bimler and faxed back to [facility] at fax [phone].

## 2014-08-01 ENCOUNTER — Ambulatory Visit
Admission: RE | Admit: 2014-08-01 | Discharge: 2014-08-01 | Disposition: A | Discharge status: Home / Self Care | Payer: 59 | Source: Ambulatory Visit | Attending: Internal Medicine | Admitting: Internal Medicine

## 2014-08-01 DIAGNOSIS — R221 Localized swelling, mass and lump, neck: Secondary | ICD-10-CM

## 2014-08-01 MED ORDER — IOHEXOL 300 MG/ML  SOLN
75.0000 mL | Freq: Once | INTRAMUSCULAR | Status: AC | PRN
  Administered 2014-08-01: 75 mL via INTRAVENOUS

## 2014-09-19 ENCOUNTER — Telehealth: Payer: Self-pay | Admitting: *Deleted

## 2014-09-19 NOTE — Telephone Encounter (Signed)
Pt states she caths in the morning and again in the evening, only twice daily.  Instructed for pt to dump her morning urine this morning,  Collect her evening urine and tomorrow morning's urine for her 24 hr collection.   Pt verbalized understanding and confirmed her lab/xray appt for tomorrow and Dr. Alvy Bimler for next week.

## 2014-09-19 NOTE — Telephone Encounter (Signed)
Pt had questions about how to collect her 24 urine.  She self caths twice a day.

## 2014-09-20 ENCOUNTER — Other Ambulatory Visit (HOSPITAL_BASED_OUTPATIENT_CLINIC_OR_DEPARTMENT_OTHER): Payer: 59

## 2014-09-20 ENCOUNTER — Ambulatory Visit (HOSPITAL_COMMUNITY)
Admission: RE | Admit: 2014-09-20 | Discharge: 2014-09-20 | Disposition: A | Discharge status: Home / Self Care | Payer: Medicare Other | Source: Ambulatory Visit | Attending: Hematology and Oncology | Admitting: Hematology and Oncology

## 2014-09-20 DIAGNOSIS — D472 Monoclonal gammopathy: Secondary | ICD-10-CM

## 2014-09-20 DIAGNOSIS — N182 Chronic kidney disease, stage 2 (mild): Secondary | ICD-10-CM | POA: Diagnosis not present

## 2014-09-20 LAB — CBC WITH DIFFERENTIAL/PLATELET
BASO%: 0.5 % (ref 0.0–2.0)
BASOS ABS: 0 10*3/uL (ref 0.0–0.1)
EOS ABS: 0.3 10*3/uL (ref 0.0–0.5)
EOS%: 6.9 % (ref 0.0–7.0)
HEMATOCRIT: 40.6 % (ref 34.8–46.6)
HEMOGLOBIN: 12.8 g/dL (ref 11.6–15.9)
LYMPH#: 1.2 10*3/uL (ref 0.9–3.3)
LYMPH%: 28.6 % (ref 14.0–49.7)
MCH: 25.5 pg (ref 25.1–34.0)
MCHC: 31.5 g/dL (ref 31.5–36.0)
MCV: 81 fL (ref 79.5–101.0)
MONO#: 0.2 10*3/uL (ref 0.1–0.9)
MONO%: 5.7 % (ref 0.0–14.0)
NEUT#: 2.4 10*3/uL (ref 1.5–6.5)
NEUT%: 58.3 % (ref 38.4–76.8)
Platelets: 210 10*3/uL (ref 145–400)
RBC: 5.01 10*6/uL (ref 3.70–5.45)
RDW: 14.7 % — ABNORMAL HIGH (ref 11.2–14.5)
WBC: 4.2 10*3/uL (ref 3.9–10.3)

## 2014-09-20 LAB — COMPREHENSIVE METABOLIC PANEL (CC13)
ALBUMIN: 3.3 g/dL — AB (ref 3.5–5.0)
ALT: 13 U/L (ref 0–55)
ANION GAP: 7 meq/L (ref 3–11)
AST: 17 U/L (ref 5–34)
Alkaline Phosphatase: 95 U/L (ref 40–150)
BUN: 13.3 mg/dL (ref 7.0–26.0)
CO2: 26 meq/L (ref 22–29)
CREATININE: 0.9 mg/dL (ref 0.6–1.1)
Calcium: 9.1 mg/dL (ref 8.4–10.4)
Chloride: 107 mEq/L (ref 98–109)
GLUCOSE: 96 mg/dL (ref 70–140)
Potassium: 3.7 mEq/L (ref 3.5–5.1)
Sodium: 140 mEq/L (ref 136–145)
Total Bilirubin: 0.78 mg/dL (ref 0.20–1.20)
Total Protein: 7.3 g/dL (ref 6.4–8.3)

## 2014-09-20 LAB — LACTATE DEHYDROGENASE (CC13): LDH: 187 U/L (ref 125–245)

## 2014-09-23 LAB — KAPPA/LAMBDA LIGHT CHAINS
Kappa free light chain: 5.44 mg/dL — ABNORMAL HIGH (ref 0.33–1.94)
Kappa:Lambda Ratio: 2.02 — ABNORMAL HIGH (ref 0.26–1.65)
Lambda Free Lght Chn: 2.69 mg/dL — ABNORMAL HIGH (ref 0.57–2.63)

## 2014-09-23 LAB — SPEP & IFE WITH QIG
ALPHA-2-GLOBULIN: 10.2 % (ref 7.1–11.8)
Albumin ELP: 51.1 % — ABNORMAL LOW (ref 55.8–66.1)
Alpha-1-Globulin: 4.3 % (ref 2.9–4.9)
BETA GLOBULIN: 6.1 % (ref 4.7–7.2)
Beta 2: 5.4 % (ref 3.2–6.5)
GAMMA GLOBULIN: 22.9 % — AB (ref 11.1–18.8)
IGA: 233 mg/dL (ref 69–380)
IGG (IMMUNOGLOBIN G), SERUM: 1670 mg/dL (ref 690–1700)
IgM, Serum: 22 mg/dL — ABNORMAL LOW (ref 52–322)
M-Spike, %: 0.53 g/dL
Total Protein, Serum Electrophoresis: 7.1 g/dL (ref 6.0–8.3)

## 2014-09-23 LAB — UPEP/TP, 24-HR URINE
Collection Interval: 24 hours
TOTAL PROTEIN, URINE: 6 mg/dL
Total Protein, Urine/Day: 42 mg/d — ABNORMAL LOW (ref 50–100)
Total Volume, Urine: 700 mL

## 2014-09-23 LAB — BETA 2 MICROGLOBULIN, SERUM: Beta-2 Microglobulin: 2.65 mg/L — ABNORMAL HIGH (ref <=2.51)

## 2014-09-23 LAB — UIFE/LIGHT CHAINS/TP QN, 24-HR UR
ALBUMIN, U: DETECTED
Alpha 1, Urine: DETECTED — AB
Alpha 2, Urine: DETECTED — AB
Beta, Urine: DETECTED — AB
Gamma Globulin, Urine: DETECTED — AB
TOTAL PROTEIN, URINE-UPE24: 6 mg/dL (ref 5–24)
Time: 24 hours
Total Protein, Urine-Ur/day: 42 mg/d (ref <150)
Volume, Urine: 700 mL

## 2014-09-24 LAB — KAPPA/LAMBDA LIGHT CHAINS, FREE, WITH RATIO, 24HR. URINE
KAPPA LIGHT CHAIN, FREE U: 15.7 mg/L (ref 1.35–24.19)
KAPPA/LAMBDA, FREE RATIO: 12.98 — AB (ref 2.04–10.37)
LAMBDA LIGHT CHAIN, FREE U: 1.21 mg/L (ref 0.24–6.66)

## 2014-09-27 ENCOUNTER — Telehealth: Payer: Self-pay | Admitting: Hematology and Oncology

## 2014-09-27 ENCOUNTER — Ambulatory Visit (HOSPITAL_BASED_OUTPATIENT_CLINIC_OR_DEPARTMENT_OTHER): Payer: Medicare Other | Admitting: Hematology and Oncology

## 2014-09-27 ENCOUNTER — Encounter: Payer: Self-pay | Admitting: Hematology and Oncology

## 2014-09-27 VITALS — BP 145/90 | HR 86 | Temp 97.9°F | Resp 18 | Ht 61.5 in | Wt 189.4 lb

## 2014-09-27 DIAGNOSIS — D472 Monoclonal gammopathy: Secondary | ICD-10-CM

## 2014-09-27 NOTE — Assessment & Plan Note (Signed)
I do not believe this is the cause of her joint pain. She has small amount of IgG kappa with no evidence of end organ damage. We discussed the natural history of MGUS. I will see her on a yearly basis with repeat blood work and skeletal survey next her. In the meantime, I recommend vitamin D supplements.

## 2014-09-27 NOTE — Progress Notes (Signed)
[place] OFFICE PROGRESS NOTE  Patient Care Team: Willey Blade, MD as PCP - General (Internal Medicine) Bo Merino, MD as Consulting Physician (Rheumatology) Heath Lark, MD as Consulting Physician (Hematology and Oncology)  SUMMARY OF ONCOLOGIC HISTORY:  Kimberly Vazquez 68 y.o. female is here because of recently discovered IgG kappa MGUS. This patient has diffuse joint pain and was recently diagnosed with systemic lupus. She was on hydroxychloroquine a week ago. The patient is a high-dose vitamin D supplement for vitamin D deficiency. She has joint pain throughout her body, worse in the lumbar region, shoulders, hips and her right leg. The patient also had bladder prolapse with neurologic bladder and she self catheterize twice a day for the last 7 years. She denies history of abnormal bone pain or bone fracture. Patient denies history of recurrent infection or atypical infections such as shingles of meningitis. Denies chills, night sweats, anorexia or abnormal weight loss.  INTERVAL HISTORY: Please see below for problem oriented charting. She feels well. She complained of diffuse bone pain. She does not take vitamin D consistently.  REVIEW OF SYSTEMS:   Constitutional: Denies fevers, chills or abnormal weight loss Eyes: Denies blurriness of vision Ears, nose, mouth, throat, and face: Denies mucositis or sore throat Respiratory: Denies cough, dyspnea or wheezes Cardiovascular: Denies palpitation, chest discomfort or lower extremity swelling Gastrointestinal:  Denies nausea, heartburn or change in bowel habits Skin: Denies abnormal skin rashes Lymphatics: Denies new lymphadenopathy or easy bruising Neurological:Denies numbness, tingling or new weaknesses Behavioral/Psych: Mood is stable, no new changes  All other systems were reviewed with the patient and are negative.  I have reviewed the past medical history, past surgical history, social history and  family history with the patient and they are unchanged from previous note.  ALLERGIES:  has No Known Allergies.  MEDICATIONS:  Current Outpatient Prescriptions  Medication Sig Dispense Refill  . aspirin 81 MG tablet Take 81 mg by mouth daily.    . chlorhexidine (PERIDEX) 0.12 % solution     . cholecalciferol (VITAMIN D) 1000 UNITS tablet Take 2,000 Units by mouth daily.    Marland Kitchen gabapentin (NEURONTIN) 100 MG capsule Take 100 mg by mouth every morning.  2  . hydroxychloroquine (PLAQUENIL) 200 MG tablet Take 200 mg by mouth 2 (two) times daily. Pt takes 274m in AM,  1052min  PM.    . losartan (COZAAR) 100 MG tablet Take 100 mg by mouth daily.     . Multiple Vitamins-Minerals (ONE-A-DAY ENERGY PO) Take by mouth.    . nitrofurantoin (MACRODANTIN) 100 MG capsule Take 100 mg by mouth daily.    . Omega-3 Fatty Acids (OMEGA-3 FISH OIL PO) Take 2 capsules by mouth daily.    . phentermine (ADIPEX-P) 37.5 MG tablet     . triamterene-hydrochlorothiazide (MAXZIDE-25) 37.5-25 MG per tablet Take 1 tablet by mouth daily.    . Marland KitchenLUZONE HIGH-DOSE 0.5 ML SUSY   0  . omeprazole (PRILOSEC) 20 MG capsule Take 20 mg by mouth as needed.     . Marland KitchenROCTOSOL HC 2.5 % rectal cream      No current facility-administered medications for this visit.    PHYSICAL EXAMINATION: ECOG PERFORMANCE STATUS: 0 - Asymptomatic  Filed Vitals:   09/27/14 1439  BP: 145/90  Pulse: 86  Temp: 97.9 F (36.6 C)  Resp: 18   Filed Weights   09/27/14 1439  Weight: 189 lb 6.4 oz (85.911 kg)    GENERAL:alert, no distress and comfortable SKIN: skin color,  texture, turgor are normal, no rashes or significant lesions EYES: normal, Conjunctiva are pink and non-injected, sclera clear Musculoskeletal:no cyanosis of digits and no clubbing  NEURO: alert & oriented x 3 with fluent speech, no focal motor/sensory deficits  LABORATORY DATA:  I have reviewed the data as listed    Component Value Date/Time   NA 140 09/20/2014 1012   NA 135  10/09/2013 1354   K 3.7 09/20/2014 1012   K 3.7 10/09/2013 1354   CL 97 10/09/2013 1354   CO2 26 09/20/2014 1012   CO2 27 10/09/2013 1354   GLUCOSE 96 09/20/2014 1012   GLUCOSE 103* 10/09/2013 1354   BUN 13.3 09/20/2014 1012   BUN 35* 10/09/2013 1354   CREATININE 0.9 09/20/2014 1012   CREATININE 1.69* 10/09/2013 1354   CALCIUM 9.1 09/20/2014 1012   CALCIUM 9.8 10/09/2013 1354   PROT 7.3 09/20/2014 1012   PROT 8.5* 10/09/2013 1354   ALBUMIN 3.3* 09/20/2014 1012   ALBUMIN 3.6 10/09/2013 1354   AST 17 09/20/2014 1012   AST 243* 10/09/2013 1354   ALT 13 09/20/2014 1012   ALT 318* 10/09/2013 1354   ALKPHOS 95 09/20/2014 1012   ALKPHOS 107 10/09/2013 1354   BILITOT 0.78 09/20/2014 1012   BILITOT 0.9 10/09/2013 1354   GFRNONAA 30* 10/09/2013 1354   GFRAA 35* 10/09/2013 1354    No results found for: SPEP, UPEP  Lab Results  Component Value Date   WBC 4.2 09/20/2014   NEUTROABS 2.4 09/20/2014   HGB 12.8 09/20/2014   HCT 40.6 09/20/2014   MCV 81.0 09/20/2014   PLT 210 09/20/2014      Chemistry      Component Value Date/Time   NA 140 09/20/2014 1012   NA 135 10/09/2013 1354   K 3.7 09/20/2014 1012   K 3.7 10/09/2013 1354   CL 97 10/09/2013 1354   CO2 26 09/20/2014 1012   CO2 27 10/09/2013 1354   BUN 13.3 09/20/2014 1012   BUN 35* 10/09/2013 1354   CREATININE 0.9 09/20/2014 1012   CREATININE 1.69* 10/09/2013 1354      Component Value Date/Time   CALCIUM 9.1 09/20/2014 1012   CALCIUM 9.8 10/09/2013 1354   ALKPHOS 95 09/20/2014 1012   ALKPHOS 107 10/09/2013 1354   AST 17 09/20/2014 1012   AST 243* 10/09/2013 1354   ALT 13 09/20/2014 1012   ALT 318* 10/09/2013 1354   BILITOT 0.78 09/20/2014 1012   BILITOT 0.9 10/09/2013 1354      ASSESSMENT & PLAN:  MGUS (monoclonal gammopathy of unknown significance) I do not believe this is the cause of her joint pain. She has small amount of IgG kappa with no evidence of end organ damage. We discussed the natural history  of MGUS. I will see her on a yearly basis with repeat blood work and skeletal survey next her. In the meantime, I recommend vitamin D supplements.      Orders Placed This Encounter  Procedures  . DG Bone Survey Met    Standing Status: Future     Number of Occurrences:      Standing Expiration Date: 11/27/2015    Order Specific Question:  Reason for Exam (SYMPTOM  OR DIAGNOSIS REQUIRED)    Answer:  staging myeloma    Order Specific Question:  Preferred imaging location?    Answer:  [facility]  . CBC with Differential    Standing Status: Future     Number of Occurrences:  Standing Expiration Date: 11/27/2015  . Comprehensive metabolic panel    Standing Status: Future     Number of Occurrences:      Standing Expiration Date: 11/27/2015  . Lactate dehydrogenase    Standing Status: Future     Number of Occurrences:      Standing Expiration Date: 11/27/2015  . SPEP & IFE with QIG    Standing Status: Future     Number of Occurrences:      Standing Expiration Date: 11/27/2015  . Kappa/lambda light chains    Standing Status: Future     Number of Occurrences:      Standing Expiration Date: 11/27/2015  . Beta 2 microglobulin, serum    Standing Status: Future     Number of Occurrences:      Standing Expiration Date: 11/27/2015   All questions were answered. The patient knows to call the clinic with any problems, questions or concerns. No barriers to learning was detected. I spent 15 minutes counseling the patient face to face. The total time spent in the appointment was 20 minutes and more than 50% was on counseling and review of test results     [facility], [place], MD 09/27/2014 3:19 PM

## 2014-09-27 NOTE — Telephone Encounter (Signed)
Gave avs & cal for Sept 2016. °

## 2014-10-03 ENCOUNTER — Other Ambulatory Visit (HOSPITAL_COMMUNITY): Payer: Self-pay | Admitting: Internal Medicine

## 2014-10-03 DIAGNOSIS — Z1231 Encounter for screening mammogram for malignant neoplasm of breast: Secondary | ICD-10-CM

## 2014-10-31 ENCOUNTER — Ambulatory Visit (HOSPITAL_COMMUNITY)
Admission: RE | Admit: 2014-10-31 | Discharge: 2014-10-31 | Disposition: A | Discharge status: Home / Self Care | Payer: Medicare Other | Source: Ambulatory Visit | Attending: Internal Medicine | Admitting: Internal Medicine

## 2014-10-31 DIAGNOSIS — R928 Other abnormal and inconclusive findings on diagnostic imaging of breast: Secondary | ICD-10-CM | POA: Diagnosis not present

## 2014-10-31 DIAGNOSIS — Z1231 Encounter for screening mammogram for malignant neoplasm of breast: Secondary | ICD-10-CM | POA: Diagnosis present

## 2014-11-02 ENCOUNTER — Other Ambulatory Visit: Payer: Self-pay | Admitting: Internal Medicine

## 2014-11-02 DIAGNOSIS — R928 Other abnormal and inconclusive findings on diagnostic imaging of breast: Secondary | ICD-10-CM

## 2014-11-09 ENCOUNTER — Ambulatory Visit
Admission: RE | Admit: 2014-11-09 | Discharge: 2014-11-09 | Disposition: A | Discharge status: Home / Self Care | Payer: Medicare Other | Source: Ambulatory Visit | Attending: Internal Medicine | Admitting: Internal Medicine

## 2014-11-09 DIAGNOSIS — R928 Other abnormal and inconclusive findings on diagnostic imaging of breast: Secondary | ICD-10-CM

## 2014-11-11 ENCOUNTER — Other Ambulatory Visit: Payer: Medicare Other

## 2015-04-06 ENCOUNTER — Encounter: Payer: Self-pay | Admitting: Gastroenterology

## 2015-06-27 ENCOUNTER — Telehealth: Payer: Self-pay | Admitting: *Deleted

## 2015-06-27 NOTE — Telephone Encounter (Signed)
INFORMED PT. THAT SHE HAS A LAB APPOINTMENT AT 10:00AM TOMORROW. SHE WANTS TO CHANGE THE APPOINTMENT TIME. INFORMED PT. THAT A SCHEDULER WILL CALL HER CONCERNING APPOINTMENT TIME. LEFT A VOICE MAIL FOR SCHEDULER, SHAMEEKA DANIELS, TO CONTACT PT.

## 2015-06-28 ENCOUNTER — Other Ambulatory Visit: Payer: 59

## 2015-06-28 ENCOUNTER — Other Ambulatory Visit (HOSPITAL_BASED_OUTPATIENT_CLINIC_OR_DEPARTMENT_OTHER): Payer: Medicare Other

## 2015-06-28 ENCOUNTER — Telehealth: Payer: Self-pay | Admitting: Hematology and Oncology

## 2015-06-28 DIAGNOSIS — D472 Monoclonal gammopathy: Secondary | ICD-10-CM | POA: Diagnosis not present

## 2015-06-28 LAB — CBC WITH DIFFERENTIAL/PLATELET
BASO%: 0.9 % (ref 0.0–2.0)
BASOS ABS: 0 10*3/uL (ref 0.0–0.1)
EOS%: 6.7 % (ref 0.0–7.0)
Eosinophils Absolute: 0.3 10*3/uL (ref 0.0–0.5)
HCT: 42.4 % (ref 34.8–46.6)
HEMOGLOBIN: 13.6 g/dL (ref 11.6–15.9)
LYMPH%: 30.7 % (ref 14.0–49.7)
MCH: 25.3 pg (ref 25.1–34.0)
MCHC: 32.1 g/dL (ref 31.5–36.0)
MCV: 79 fL — AB (ref 79.5–101.0)
MONO#: 0.4 10*3/uL (ref 0.1–0.9)
MONO%: 7.6 % (ref 0.0–14.0)
NEUT#: 2.5 10*3/uL (ref 1.5–6.5)
NEUT%: 54.1 % (ref 38.4–76.8)
Platelets: 207 10*3/uL (ref 145–400)
RBC: 5.37 10*6/uL (ref 3.70–5.45)
RDW: 14.7 % — ABNORMAL HIGH (ref 11.2–14.5)
WBC: 4.6 10*3/uL (ref 3.9–10.3)
lymph#: 1.4 10*3/uL (ref 0.9–3.3)
nRBC: 0 % (ref 0–0)

## 2015-06-28 LAB — COMPREHENSIVE METABOLIC PANEL (CC13)
ALBUMIN: 3.5 g/dL (ref 3.5–5.0)
ALK PHOS: 88 U/L (ref 40–150)
ALT: 27 U/L (ref 0–55)
AST: 23 U/L (ref 5–34)
Anion Gap: 9 mEq/L (ref 3–11)
BUN: 16.6 mg/dL (ref 7.0–26.0)
CALCIUM: 9.6 mg/dL (ref 8.4–10.4)
CO2: 26 mEq/L (ref 22–29)
Chloride: 105 mEq/L (ref 98–109)
Creatinine: 1.2 mg/dL — ABNORMAL HIGH (ref 0.6–1.1)
EGFR: 54 mL/min/{1.73_m2} — AB (ref >90)
GLUCOSE: 82 mg/dL (ref 70–140)
Potassium: 3.8 mEq/L (ref 3.5–5.1)
SODIUM: 141 meq/L (ref 136–145)
Total Bilirubin: 0.77 mg/dL (ref 0.20–1.20)
Total Protein: 7.5 g/dL (ref 6.4–8.3)

## 2015-06-28 LAB — LACTATE DEHYDROGENASE (CC13): LDH: 228 U/L (ref 125–245)

## 2015-06-28 NOTE — Telephone Encounter (Signed)
returned call and s.w. pt and confirm appts...and r/s to later time....pt ok and ware

## 2015-07-03 LAB — SPEP & IFE WITH QIG
ALPHA-1-GLOBULIN: 0.3 g/dL (ref 0.2–0.3)
Abnormal Protein Band1: 0.5 g/dL
Albumin ELP: 3.5 g/dL — ABNORMAL LOW (ref 3.8–4.8)
Alpha-2-Globulin: 0.8 g/dL (ref 0.5–0.9)
BETA 2: 0.4 g/dL (ref 0.2–0.5)
Beta Globulin: 0.5 g/dL (ref 0.4–0.6)
GAMMA GLOBULIN: 1.6 g/dL (ref 0.8–1.7)
IGM, SERUM: 19 mg/dL — AB (ref 52–322)
IgA: 242 mg/dL (ref 69–380)
IgG (Immunoglobin G), Serum: 1750 mg/dL — ABNORMAL HIGH (ref 690–1700)
Total Protein, Serum Electrophoresis: 7.1 g/dL (ref 6.1–8.1)

## 2015-07-03 LAB — KAPPA/LAMBDA LIGHT CHAINS
KAPPA FREE LGHT CHN: 6.51 mg/dL — AB (ref 0.33–1.94)
KAPPA LAMBDA RATIO: 2.26 — AB (ref 0.26–1.65)
LAMBDA FREE LGHT CHN: 2.88 mg/dL — AB (ref 0.57–2.63)

## 2015-07-03 LAB — BETA 2 MICROGLOBULIN, SERUM: BETA 2 MICROGLOBULIN: 2.31 mg/L (ref <=2.51)

## 2015-07-05 ENCOUNTER — Telehealth: Payer: Self-pay | Admitting: *Deleted

## 2015-07-05 NOTE — Telephone Encounter (Signed)
Received vm message from pt inquiring about appt time for Dr. Alvy Bimler on Friday, 07/07/15.  Return call to pt and informed her of time.

## 2015-07-07 ENCOUNTER — Ambulatory Visit (HOSPITAL_BASED_OUTPATIENT_CLINIC_OR_DEPARTMENT_OTHER): Payer: Medicare Other | Admitting: Hematology and Oncology

## 2015-07-07 ENCOUNTER — Other Ambulatory Visit (HOSPITAL_BASED_OUTPATIENT_CLINIC_OR_DEPARTMENT_OTHER): Payer: Medicare Other | Admitting: *Deleted

## 2015-07-07 ENCOUNTER — Telehealth: Payer: Self-pay | Admitting: Hematology and Oncology

## 2015-07-07 VITALS — BP 126/78 | HR 78 | Temp 98.1°F | Resp 18 | Ht 61.5 in | Wt 191.6 lb

## 2015-07-07 DIAGNOSIS — D472 Monoclonal gammopathy: Secondary | ICD-10-CM

## 2015-07-07 DIAGNOSIS — M329 Systemic lupus erythematosus, unspecified: Secondary | ICD-10-CM

## 2015-07-07 DIAGNOSIS — N183 Chronic kidney disease, stage 3 unspecified: Secondary | ICD-10-CM

## 2015-07-07 DIAGNOSIS — Z23 Encounter for immunization: Secondary | ICD-10-CM

## 2015-07-07 DIAGNOSIS — Z299 Encounter for prophylactic measures, unspecified: Secondary | ICD-10-CM

## 2015-07-07 DIAGNOSIS — IMO0002 Reserved for concepts with insufficient information to code with codable children: Secondary | ICD-10-CM

## 2015-07-07 MED ORDER — INFLUENZA VAC SPLIT QUAD 0.5 ML IM SUSY
0.5000 mL | PREFILLED_SYRINGE | Freq: Once | INTRAMUSCULAR | Status: AC
  Administered 2015-07-07: 0.5 mL via INTRAMUSCULAR
  Filled 2015-07-07: qty 0.5

## 2015-07-07 NOTE — Assessment & Plan Note (Signed)
We discussed the importance of preventive care and reviewed the vaccination programs. She does not have any prior allergic reactions to influenza vaccination. She agrees to proceed with influenza vaccination today and we will administer it today at the clinic.  

## 2015-07-07 NOTE — Assessment & Plan Note (Signed)
I do not believe this is the cause of her joint pain. She has small amount of IgG kappa with no evidence of end organ damage. We discussed the natural history of MGUS. I will see her on a yearly basis with repeat blood work. In the meantime, I recommend vitamin D supplements.

## 2015-07-07 NOTE — Assessment & Plan Note (Signed)
she will continue current medical management. I recommend close follow-up with primary care doctor for medication adjustment.  

## 2015-07-07 NOTE — Progress Notes (Signed)
[facility] OFFICE PROGRESS NOTE  Patient Care Team: Willey Blade, MD as PCP - General (Internal Medicine) Bo Merino, MD as Consulting Physician (Rheumatology) Heath Lark, MD as Consulting Physician (Hematology and Oncology)  SUMMARY OF ONCOLOGIC HISTORY:  Kimberly Vazquez is here because of recently discovered IgG kappa MGUS. This patient has diffuse joint pain and was recently diagnosed with systemic lupus. She was on hydroxychloroquine a week ago. The patient is a high-dose vitamin D supplement for vitamin D deficiency. She has joint pain throughout her body, worse in the lumbar region, shoulders, hips and her right leg. The patient also had bladder prolapse with neurologic bladder and she self catheterize twice a day for the last 7 years.   INTERVAL HISTORY: Please see below for problem oriented charting. She denies history of abnormal bone pain or bone fracture. Patient denies history of recurrent infection or atypical infections such as shingles of meningitis. Denies chills, night sweats, anorexia or abnormal weight loss.  REVIEW OF SYSTEMS:   Constitutional: Denies fevers, chills or abnormal weight loss Eyes: Denies blurriness of vision Ears, nose, mouth, throat, and face: Denies mucositis or sore throat Respiratory: Denies cough, dyspnea or wheezes Cardiovascular: Denies palpitation, chest discomfort or lower extremity swelling Gastrointestinal:  Denies nausea, heartburn or change in bowel habits Skin: Denies abnormal skin rashes Lymphatics: Denies new lymphadenopathy or easy bruising Neurological:Denies numbness, tingling or new weaknesses Behavioral/Psych: Mood is stable, no new changes  All other systems were reviewed with the patient and are negative.  I have reviewed the past medical history, past surgical history, social history and family history with the patient and they are unchanged from previous note.  ALLERGIES:  has No Known  Allergies.  MEDICATIONS:  Current Outpatient Prescriptions  Medication Sig Dispense Refill  . cholecalciferol (VITAMIN D) 1000 UNITS tablet Take 2,000 Units by mouth daily.    . hydroxychloroquine (PLAQUENIL) 200 MG tablet Take 200 mg by mouth 2 (two) times daily. Pt takes 200mg  in AM,  100mg  in  PM.    . losartan (COZAAR) 100 MG tablet Take 100 mg by mouth daily.     . Multiple Vitamins-Minerals (ONE-A-DAY ENERGY PO) Take by mouth.    . nitrofurantoin (MACRODANTIN) 100 MG capsule Take 100 mg by mouth daily.    . phentermine (ADIPEX-P) 37.5 MG tablet     . PROCTOSOL HC 2.5 % rectal cream     . triamterene-hydrochlorothiazide (MAXZIDE-25) 37.5-25 MG per tablet Take 1 tablet by mouth daily.     No current facility-administered medications for this visit.    PHYSICAL EXAMINATION: ECOG PERFORMANCE STATUS: 0 - Asymptomatic  Filed Vitals:   07/07/15 1126  BP: 126/78  Pulse: 78  Temp: 98.1 F (36.7 C)  Resp: 18   Filed Weights   07/07/15 1126  Weight: 191 lb 9.6 oz (86.909 kg)    GENERAL:alert, no distress and comfortable SKIN: skin color, texture, turgor are normal, no rashes or significant lesions EYES: normal, Conjunctiva are pink and non-injected, sclera clear OROPHARYNX:no exudate, no erythema and lips, buccal mucosa, and tongue normal  NECK: supple, thyroid normal size, non-tender, without nodularity LYMPH:  no palpable lymphadenopathy in the cervical, axillary or inguinal LUNGS: clear to auscultation and percussion with normal breathing effort HEART: regular rate & rhythm and no murmurs and no lower extremity edema ABDOMEN:abdomen soft, non-tender and normal bowel sounds Musculoskeletal:no cyanosis of digits and no clubbing  NEURO: alert & oriented x 3 with fluent speech, no focal motor/sensory deficits  LABORATORY  DATA:  I have reviewed the data as listed    Component Value Date/Time   NA 141 06/28/2015 1414   NA 135 10/09/2013 1354   K 3.8 06/28/2015 1414   K 3.7  10/09/2013 1354   CL 97 10/09/2013 1354   CO2 26 06/28/2015 1414   CO2 27 10/09/2013 1354   GLUCOSE 82 06/28/2015 1414   GLUCOSE 103* 10/09/2013 1354   BUN 16.6 06/28/2015 1414   BUN 35* 10/09/2013 1354   CREATININE 1.2* 06/28/2015 1414   CREATININE 1.69* 10/09/2013 1354   CALCIUM 9.6 06/28/2015 1414   CALCIUM 9.8 10/09/2013 1354   PROT 7.5 06/28/2015 1414   PROT 8.5* 10/09/2013 1354   ALBUMIN 3.5 06/28/2015 1414   ALBUMIN 3.6 10/09/2013 1354   AST 23 06/28/2015 1414   AST 243* 10/09/2013 1354   ALT 27 06/28/2015 1414   ALT 318* 10/09/2013 1354   ALKPHOS 88 06/28/2015 1414   ALKPHOS 107 10/09/2013 1354   BILITOT 0.77 06/28/2015 1414   BILITOT 0.9 10/09/2013 1354   GFRNONAA 30* 10/09/2013 1354   GFRAA 35* 10/09/2013 1354    No results found for: SPEP, UPEP  Lab Results  Component Value Date   WBC 4.6 06/28/2015   NEUTROABS 2.5 06/28/2015   HGB 13.6 06/28/2015   HCT 42.4 06/28/2015   MCV 79.0* 06/28/2015   PLT 207 06/28/2015      Chemistry      Component Value Date/Time   NA 141 06/28/2015 1414   NA 135 10/09/2013 1354   K 3.8 06/28/2015 1414   K 3.7 10/09/2013 1354   CL 97 10/09/2013 1354   CO2 26 06/28/2015 1414   CO2 27 10/09/2013 1354   BUN 16.6 06/28/2015 1414   BUN 35* 10/09/2013 1354   CREATININE 1.2* 06/28/2015 1414   CREATININE 1.69* 10/09/2013 1354      Component Value Date/Time   CALCIUM 9.6 06/28/2015 1414   CALCIUM 9.8 10/09/2013 1354   ALKPHOS 88 06/28/2015 1414   ALKPHOS 107 10/09/2013 1354   AST 23 06/28/2015 1414   AST 243* 10/09/2013 1354   ALT 27 06/28/2015 1414   ALT 318* 10/09/2013 1354   BILITOT 0.77 06/28/2015 1414   BILITOT 0.9 10/09/2013 1354       ASSESSMENT & PLAN:  MGUS (monoclonal gammopathy of unknown significance) I do not believe this is the cause of her joint pain. She has small amount of IgG kappa with no evidence of end organ damage. We discussed the natural history of MGUS. I will see her on a yearly basis  with repeat blood work. In the meantime, I recommend vitamin D supplements.    CKD (chronic kidney disease), stage III she will continue current medical management. I recommend close follow-up with primary care doctor for medication adjustment.   Preventive measure We discussed the importance of preventive care and reviewed the vaccination programs. She does not have any prior allergic reactions to influenza vaccination. She agrees to proceed with influenza vaccination today and we will administer it today at the clinic.    Orders Placed This Encounter  Procedures  . SPEP & IFE with QIG    Standing Status: Future     Number of Occurrences:      Standing Expiration Date: 08/10/2016  . Kappa/lambda light chains    Standing Status: Future     Number of Occurrences:      Standing Expiration Date: 08/10/2016  . Comprehensive metabolic panel    Standing Status: Future  Number of Occurrences:      Standing Expiration Date: 08/10/2016  . CBC with Differential/Platelet    Standing Status: Future     Number of Occurrences:      Standing Expiration Date: 08/10/2016   All questions were answered. The patient knows to call the clinic with any problems, questions or concerns. No barriers to learning was detected. I spent 15 minutes counseling the patient face to face. The total time spent in the appointment was 20 minutes and more than 50% was on counseling and review of test results     [facility], [place], MD 07/07/2015 5:11 PM

## 2015-07-07 NOTE — Telephone Encounter (Signed)
lvm for pt regarding to sept 2017 appt.Marland KitchenMarland Kitchen

## 2015-10-10 ENCOUNTER — Other Ambulatory Visit: Payer: Self-pay

## 2015-10-10 DIAGNOSIS — Z1231 Encounter for screening mammogram for malignant neoplasm of breast: Secondary | ICD-10-CM

## 2015-11-09 ENCOUNTER — Ambulatory Visit
Admission: RE | Admit: 2015-11-09 | Discharge: 2015-11-09 | Disposition: A | Discharge status: Home / Self Care | Payer: Medicare Other | Source: Ambulatory Visit

## 2015-11-09 DIAGNOSIS — Z1231 Encounter for screening mammogram for malignant neoplasm of breast: Secondary | ICD-10-CM

## 2016-06-28 ENCOUNTER — Other Ambulatory Visit (HOSPITAL_BASED_OUTPATIENT_CLINIC_OR_DEPARTMENT_OTHER): Payer: Medicare Other

## 2016-06-28 DIAGNOSIS — D472 Monoclonal gammopathy: Secondary | ICD-10-CM

## 2016-06-28 LAB — COMPREHENSIVE METABOLIC PANEL
ALBUMIN: 3.2 g/dL — AB (ref 3.5–5.0)
ALK PHOS: 88 U/L (ref 40–150)
ALT: 16 U/L (ref 0–55)
ANION GAP: 9 meq/L (ref 3–11)
AST: 22 U/L (ref 5–34)
BILIRUBIN TOTAL: 0.55 mg/dL (ref 0.20–1.20)
BUN: 13.5 mg/dL (ref 7.0–26.0)
CO2: 26 mEq/L (ref 22–29)
Calcium: 9.2 mg/dL (ref 8.4–10.4)
Chloride: 105 mEq/L (ref 98–109)
Creatinine: 0.8 mg/dL (ref 0.6–1.1)
EGFR: 83 mL/min/{1.73_m2} — AB (ref >90)
Glucose: 84 mg/dl (ref 70–140)
Potassium: 3.6 mEq/L (ref 3.5–5.1)
Sodium: 140 mEq/L (ref 136–145)
TOTAL PROTEIN: 7.5 g/dL (ref 6.4–8.3)

## 2016-06-28 LAB — CBC WITH DIFFERENTIAL/PLATELET
BASO%: 1 % (ref 0.0–2.0)
Basophils Absolute: 0.1 10*3/uL (ref 0.0–0.1)
EOS ABS: 0.7 10*3/uL — AB (ref 0.0–0.5)
EOS%: 12.7 % — ABNORMAL HIGH (ref 0.0–7.0)
HCT: 39.2 % (ref 34.8–46.6)
HEMOGLOBIN: 12.2 g/dL (ref 11.6–15.9)
LYMPH%: 29.4 % (ref 14.0–49.7)
MCH: 23.9 pg — ABNORMAL LOW (ref 25.1–34.0)
MCHC: 31 g/dL — ABNORMAL LOW (ref 31.5–36.0)
MCV: 77 fL — AB (ref 79.5–101.0)
MONO#: 0.3 10*3/uL (ref 0.1–0.9)
MONO%: 6.1 % (ref 0.0–14.0)
NEUT%: 50.8 % (ref 38.4–76.8)
NEUTROS ABS: 2.6 10*3/uL (ref 1.5–6.5)
Platelets: 239 10*3/uL (ref 145–400)
RBC: 5.09 10*6/uL (ref 3.70–5.45)
RDW: 16.3 % — ABNORMAL HIGH (ref 11.2–14.5)
WBC: 5.2 10*3/uL (ref 3.9–10.3)
lymph#: 1.5 10*3/uL (ref 0.9–3.3)

## 2016-07-01 LAB — KAPPA/LAMBDA LIGHT CHAINS
IG KAPPA FREE LIGHT CHAIN: 58.4 mg/L — AB (ref 3.3–19.4)
IG LAMBDA FREE LIGHT CHAIN: 22.7 mg/L (ref 5.7–26.3)
Kappa/Lambda FluidC Ratio: 2.57 — ABNORMAL HIGH (ref 0.26–1.65)

## 2016-07-04 LAB — MULTIPLE MYELOMA PANEL, SERUM
Albumin SerPl Elph-Mcnc: 3.5 g/dL (ref 2.9–4.4)
Albumin/Glob SerPl: 1.1 (ref 0.7–1.7)
Alpha 1: 0.2 g/dL (ref 0.0–0.4)
Alpha2 Glob SerPl Elph-Mcnc: 0.7 g/dL (ref 0.4–1.0)
B-Globulin SerPl Elph-Mcnc: 1.1 g/dL (ref 0.7–1.3)
GAMMA GLOB SERPL ELPH-MCNC: 1.3 g/dL (ref 0.4–1.8)
GLOBULIN, TOTAL: 3.3 g/dL (ref 2.2–3.9)
IgA, Qn, Serum: 202 mg/dL (ref 87–352)
IgG, Qn, Serum: 1566 mg/dL (ref 700–1600)
IgM, Qn, Serum: 15 mg/dL — ABNORMAL LOW (ref 26–217)
Total Protein: 6.8 g/dL (ref 6.0–8.5)

## 2016-07-05 ENCOUNTER — Encounter: Payer: Self-pay | Admitting: Hematology and Oncology

## 2016-07-05 ENCOUNTER — Ambulatory Visit (HOSPITAL_BASED_OUTPATIENT_CLINIC_OR_DEPARTMENT_OTHER): Payer: Medicare Other | Admitting: Hematology and Oncology

## 2016-07-05 VITALS — BP 137/75 | HR 118 | Temp 98.6°F | Resp 18 | Ht 61.5 in | Wt 193.4 lb

## 2016-07-05 DIAGNOSIS — M3210 Systemic lupus erythematosus, organ or system involvement unspecified: Secondary | ICD-10-CM | POA: Diagnosis not present

## 2016-07-05 DIAGNOSIS — M329 Systemic lupus erythematosus, unspecified: Secondary | ICD-10-CM

## 2016-07-05 DIAGNOSIS — Z23 Encounter for immunization: Secondary | ICD-10-CM | POA: Diagnosis not present

## 2016-07-05 DIAGNOSIS — D472 Monoclonal gammopathy: Secondary | ICD-10-CM | POA: Diagnosis not present

## 2016-07-05 MED ORDER — INFLUENZA VAC SPLIT QUAD 0.5 ML IM SUSY
0.5000 mL | PREFILLED_SYRINGE | Freq: Once | INTRAMUSCULAR | Status: AC
  Administered 2016-07-05: 0.5 mL via INTRAMUSCULAR
  Filled 2016-07-05: qty 0.5

## 2016-07-05 NOTE — Progress Notes (Signed)
[facility] OFFICE PROGRESS NOTE  Patient Care Team: Willey Blade, MD as PCP - General (Internal Medicine) Bo Merino, MD as Consulting Physician (Rheumatology) Heath Lark, MD as Consulting Physician (Hematology and Oncology)  SUMMARY OF ONCOLOGIC HISTORY:  Kimberly Vazquez is here because of recently discovered IgG kappa MGUS. This patient has diffuse joint pain and was recently diagnosed with systemic lupus. She was on hydroxychloroquine a week ago. The patient is a high-dose vitamin D supplement for vitamin D deficiency. She has joint pain throughout her body, worse in the lumbar region, shoulders, hips and her right leg. The patient also had bladder prolapse with neurologic bladder and she self catheterize twice a day for the last 7 years.   INTERVAL HISTORY: Please see below for problem oriented charting. She denies history of abnormal bone pain or bone fracture. Patient denies history of recurrent infection or atypical infections such as shingles of meningitis. Denies chills, night sweats, anorexia or abnormal weight loss. She has recent intermittent inflammation on her toes and was prescribed multiple doses of prednisone over the past year  REVIEW OF SYSTEMS:   Constitutional: Denies fevers, chills or abnormal weight loss Eyes: Denies blurriness of vision Ears, nose, mouth, throat, and face: Denies mucositis or sore throat Respiratory: Denies cough, dyspnea or wheezes Cardiovascular: Denies palpitation, chest discomfort or lower extremity swelling Gastrointestinal:  Denies nausea, heartburn or change in bowel habits Skin: Denies abnormal skin rashes Lymphatics: Denies new lymphadenopathy or easy bruising Neurological:Denies numbness, tingling or new weaknesses Behavioral/Psych: Mood is stable, no new changes  All other systems were reviewed with the patient and are negative.  I have reviewed the past medical history, past surgical history, social  history and family history with the patient and they are unchanged from previous note.  ALLERGIES:  has No Known Allergies.  MEDICATIONS:  Current Outpatient Prescriptions  Medication Sig Dispense Refill  . cholecalciferol (VITAMIN D) 1000 UNITS tablet Take 2,000 Units by mouth daily.    . hydroxychloroquine (PLAQUENIL) 200 MG tablet Take 200 mg by mouth 2 (two) times daily. Pt takes 250m in AM,  1094min  PM.    . losartan (COZAAR) 100 MG tablet Take 100 mg by mouth daily.     . Multiple Vitamins-Minerals (ONE-A-DAY ENERGY PO) Take by mouth.    . nitrofurantoin (MACRODANTIN) 50 MG capsule Take 50 mg by mouth once.    . Marland KitchenORTRIPTYLINE HCL PO Take by mouth daily.    . Marland KitchenROCTOSOL HC 2.5 % rectal cream     . triamterene-hydrochlorothiazide (MAXZIDE-25) 37.5-25 MG per tablet Take 1 tablet by mouth daily.     No current facility-administered medications for this visit.     PHYSICAL EXAMINATION: ECOG PERFORMANCE STATUS: 0 - Asymptomatic  Vitals:   07/05/16 1020  BP: 137/75  Pulse: (!) 118  Resp: 18  Temp: 98.6 F (37 C)   Filed Weights   07/05/16 1020  Weight: 193 lb 6.4 oz (87.7 kg)    GENERAL:alert, no distress and comfortable SKIN: skin color, texture, turgor are normal, no rashes or significant lesions EYES: normal, Conjunctiva are pink and non-injected, sclera clear Musculoskeletal:no cyanosis of digits and no clubbing  NEURO: alert & oriented x 3 with fluent speech, no focal motor/sensory deficits  LABORATORY DATA:  I have reviewed the data as listed    Component Value Date/Time   NA 140 06/28/2016 1117   K 3.6 06/28/2016 1117   CL 97 10/09/2013 1354   CO2 26 06/28/2016 1117  GLUCOSE 84 06/28/2016 1117   BUN 13.5 06/28/2016 1117   CREATININE 0.8 06/28/2016 1117   CALCIUM 9.2 06/28/2016 1117   PROT 7.5 06/28/2016 1117   ALBUMIN 3.2 (L) 06/28/2016 1117   AST 22 06/28/2016 1117   ALT 16 06/28/2016 1117   ALKPHOS 88 06/28/2016 1117   BILITOT 0.55 06/28/2016 1117    GFRNONAA 30 (L) 10/09/2013 1354   GFRAA 35 (L) 10/09/2013 1354    No results found for: SPEP, UPEP  Lab Results  Component Value Date   WBC 5.2 06/28/2016   NEUTROABS 2.6 06/28/2016   HGB 12.2 06/28/2016   HCT 39.2 06/28/2016   MCV 77.0 (L) 06/28/2016   PLT 239 06/28/2016      Chemistry      Component Value Date/Time   NA 140 06/28/2016 1117   K 3.6 06/28/2016 1117   CL 97 10/09/2013 1354   CO2 26 06/28/2016 1117   BUN 13.5 06/28/2016 1117   CREATININE 0.8 06/28/2016 1117      Component Value Date/Time   CALCIUM 9.2 06/28/2016 1117   ALKPHOS 88 06/28/2016 1117   AST 22 06/28/2016 1117   ALT 16 06/28/2016 1117   BILITOT 0.55 06/28/2016 1117       ASSESSMENT & PLAN:  MGUS (monoclonal gammopathy of unknown significance) I do not believe this is the cause of her joint pain. The MGUS continues to improve related to intermittent doses of prednisone We discussed the natural history of MGUS. I will see her on a yearly basis with repeat blood work. In the meantime, I recommend vitamin D supplements.   Lupus She has chronic systemic lupus on hydroxychloroquine and intermittent doses of prednisone. I would defer to her rheumatologist for further management   Orders Placed This Encounter  Procedures  . Comprehensive metabolic panel    Standing Status:   Future    Standing Expiration Date:   08/09/2017  . CBC with Differential/Platelet    Standing Status:   Future    Standing Expiration Date:   08/09/2017  . Kappa/lambda light chains    Standing Status:   Future    Standing Expiration Date:   08/09/2017  . Multiple Myeloma Panel (SPEP&IFE w/QIG)    Standing Status:   Future    Standing Expiration Date:   08/09/2017   All questions were answered. The patient knows to call the clinic with any problems, questions or concerns. No barriers to learning was detected. I spent 15 minutes counseling the patient face to face. The total time spent in the appointment was 20  minutes and more than 50% was on counseling and review of test results     [facility], [place], MD 07/05/2016 12:55 PM

## 2016-07-05 NOTE — Assessment & Plan Note (Signed)
She has chronic systemic lupus on hydroxychloroquine and intermittent doses of prednisone. I would defer to her rheumatologist for further management 

## 2016-07-05 NOTE — Assessment & Plan Note (Signed)
I do not believe this is the cause of her joint pain. The MGUS continues to improve related to intermittent doses of prednisone We discussed the natural history of MGUS. I will see her on a yearly basis with repeat blood work. In the meantime, I recommend vitamin D supplements.

## 2016-07-18 ENCOUNTER — Telehealth: Payer: Self-pay | Admitting: Hematology and Oncology

## 2016-07-18 NOTE — Telephone Encounter (Signed)
Left message re sept 2018 appointments and mailed schedule

## 2016-08-29 ENCOUNTER — Other Ambulatory Visit: Payer: Self-pay | Admitting: Rheumatology

## 2016-08-29 ENCOUNTER — Telehealth: Payer: Self-pay | Admitting: Radiology

## 2016-08-29 ENCOUNTER — Encounter: Payer: Self-pay | Admitting: Rheumatology

## 2016-08-29 DIAGNOSIS — Z79899 Other long term (current) drug therapy: Secondary | ICD-10-CM

## 2016-08-29 LAB — COMPLETE METABOLIC PANEL WITH GFR
ALBUMIN: 3.6 g/dL (ref 3.6–5.1)
ALK PHOS: 71 U/L (ref 33–130)
ALT: 10 U/L (ref 6–29)
AST: 18 U/L (ref 10–35)
BUN: 12 mg/dL (ref 7–25)
CALCIUM: 8.7 mg/dL (ref 8.6–10.4)
CHLORIDE: 105 mmol/L (ref 98–110)
CO2: 26 mmol/L (ref 20–31)
Creat: 0.88 mg/dL (ref 0.60–0.93)
GFR, EST AFRICAN AMERICAN: 77 mL/min (ref >=60)
GFR, EST NON AFRICAN AMERICAN: 67 mL/min (ref >=60)
Glucose, Bld: 85 mg/dL (ref 65–99)
POTASSIUM: 3.9 mmol/L (ref 3.5–5.3)
Sodium: 139 mmol/L (ref 135–146)
Total Bilirubin: 0.5 mg/dL (ref 0.2–1.2)
Total Protein: 6.6 g/dL (ref 6.1–8.1)

## 2016-08-29 LAB — CBC WITH DIFFERENTIAL/PLATELET
BASOS ABS: 0 {cells}/uL (ref 0–200)
Basophils Relative: 0 %
Eosinophils Absolute: 585 cells/uL — ABNORMAL HIGH (ref 15–500)
Eosinophils Relative: 13 %
HEMATOCRIT: 37.3 % (ref 35.0–45.0)
HEMOGLOBIN: 11.4 g/dL — AB (ref 11.7–15.5)
LYMPHS ABS: 1350 {cells}/uL (ref 850–3900)
Lymphocytes Relative: 30 %
MCH: 24.1 pg — AB (ref 27.0–33.0)
MCHC: 30.6 g/dL — AB (ref 32.0–36.0)
MCV: 78.7 fL — AB (ref 80.0–100.0)
MONO ABS: 315 {cells}/uL (ref 200–950)
MPV: 10.8 fL (ref 7.5–12.5)
Monocytes Relative: 7 %
NEUTROS PCT: 50 %
Neutro Abs: 2250 cells/uL (ref 1500–7800)
Platelets: 258 10*3/uL (ref 140–400)
RBC: 4.74 MIL/uL (ref 3.80–5.10)
RDW: 15.3 % — AB (ref 11.0–15.0)
WBC: 4.5 10*3/uL (ref 3.8–10.8)

## 2016-08-29 NOTE — Telephone Encounter (Signed)
Eye exam 08/14/15 Due now will call patient

## 2016-08-29 NOTE — Telephone Encounter (Signed)
Patient needs follow up appt with Dr Deveshwar pls call to make appt.  Jan 

## 2016-08-29 NOTE — Telephone Encounter (Signed)
Patient came by states eye exam was end of October, Dr Katy Fitch, normal She had labs today

## 2016-08-29 NOTE — Telephone Encounter (Signed)
Last visit 06/19/16 Next visit not scheduled message sent  Labs 05/02/16 Eye exam 08/14/15 Due now will call patient  Ok to refill per Dr Estanislado Pandy

## 2016-08-30 ENCOUNTER — Telehealth: Payer: Self-pay | Admitting: Radiology

## 2016-08-30 NOTE — Telephone Encounter (Signed)
I have called patient to advise labs are normal  

## 2016-08-30 NOTE — Progress Notes (Signed)
Normal labs.

## 2016-08-30 NOTE — Telephone Encounter (Signed)
-----   Message from Bo Merino, MD sent at 08/30/2016 12:31 PM EST ----- Normal labs

## 2016-09-05 ENCOUNTER — Other Ambulatory Visit (INDEPENDENT_AMBULATORY_CARE_PROVIDER_SITE_OTHER): Payer: Self-pay | Admitting: Specialist

## 2016-09-05 MED ORDER — NORTRIPTYLINE HCL 25 MG PO CAPS: 25.0000 mg | ORAL_CAPSULE | Freq: Every day | ORAL | 3 refills | Status: DC

## 2016-09-18 IMAGING — CR DG BONE SURVEY MET
9 of 10 series · 9 of 10 positions shown · non-contrast
Comparison: None.

CLINICAL DATA: Myeloma.

EXAM:
METASTATIC BONE SURVEY

[w chest pa]
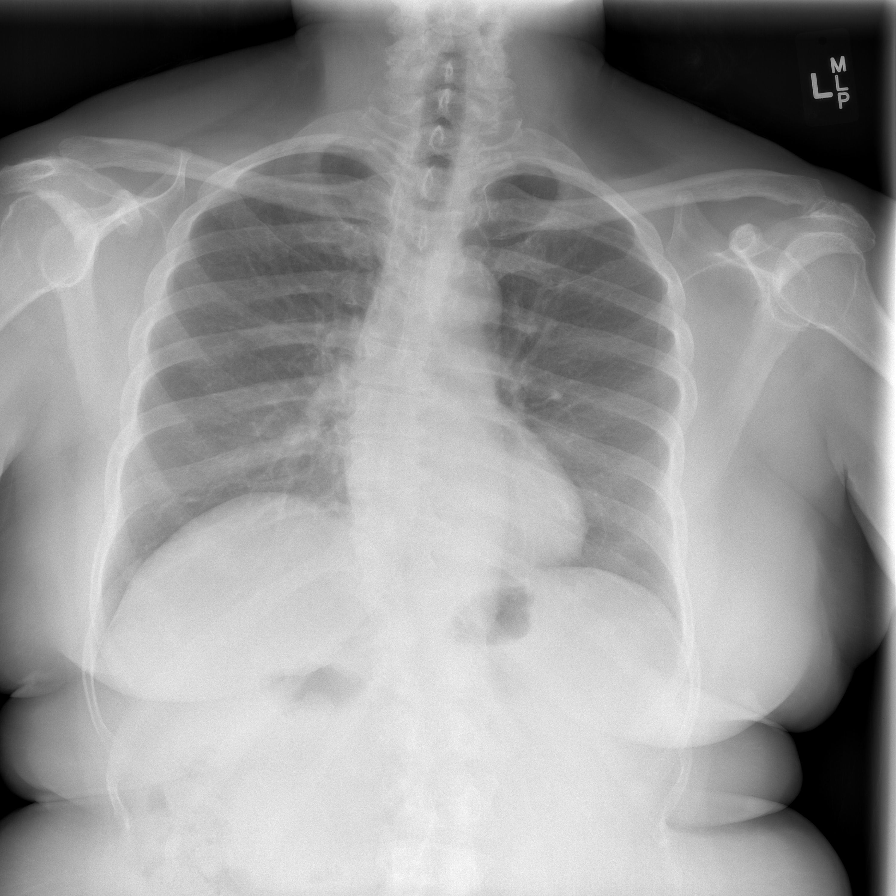

[w c-spine lat]
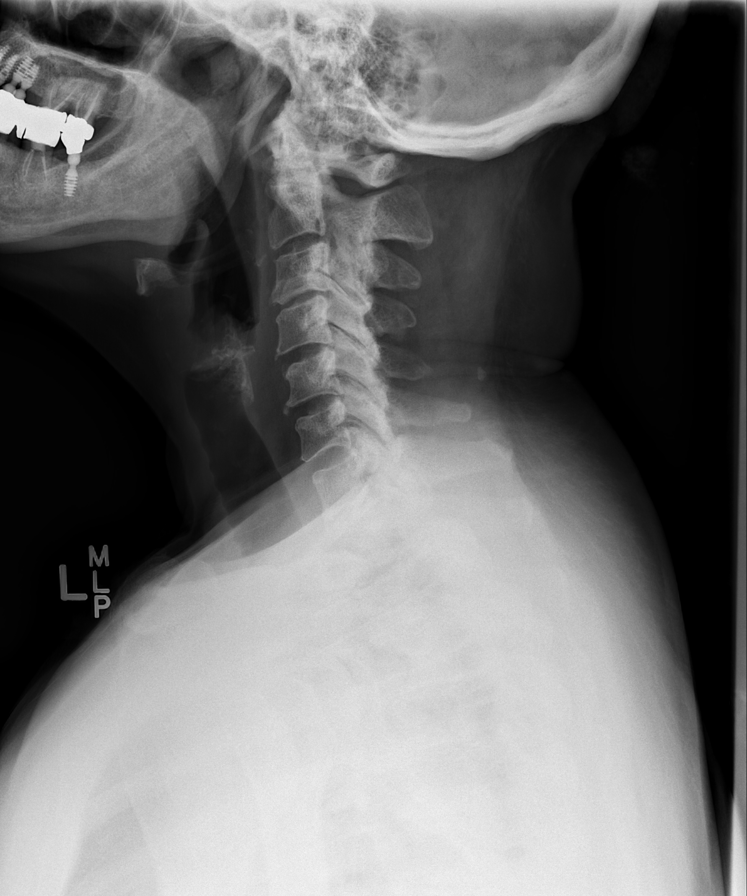

[w skull lat]
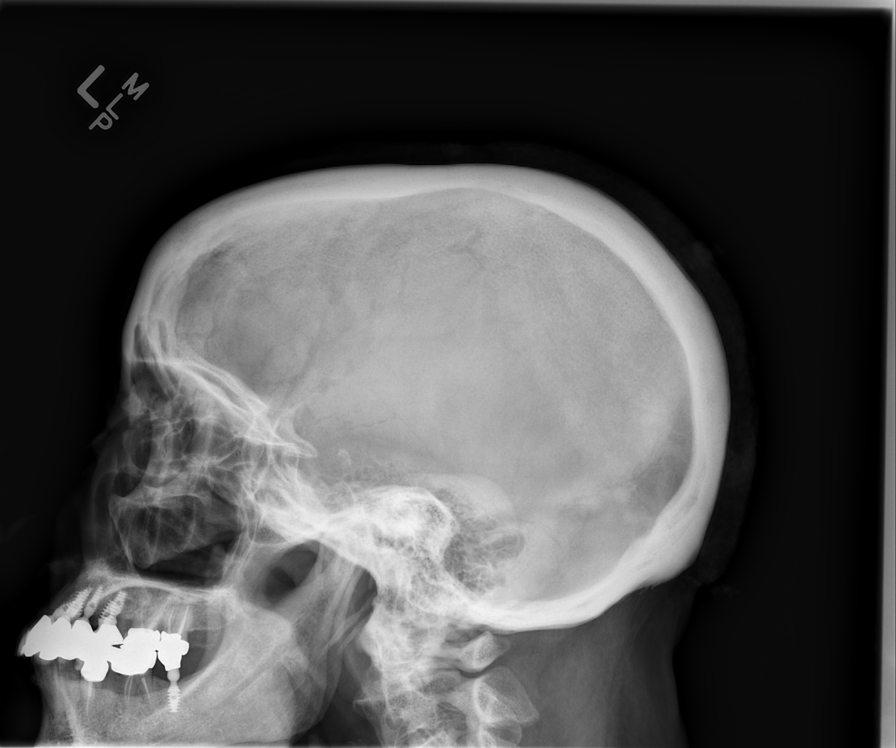

[w swimmers view]
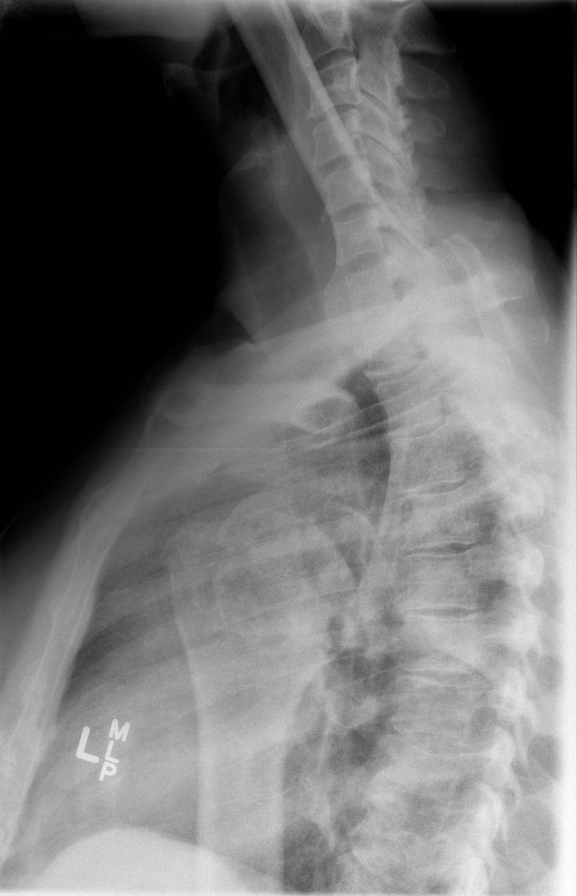

[w c-spine a.p.]
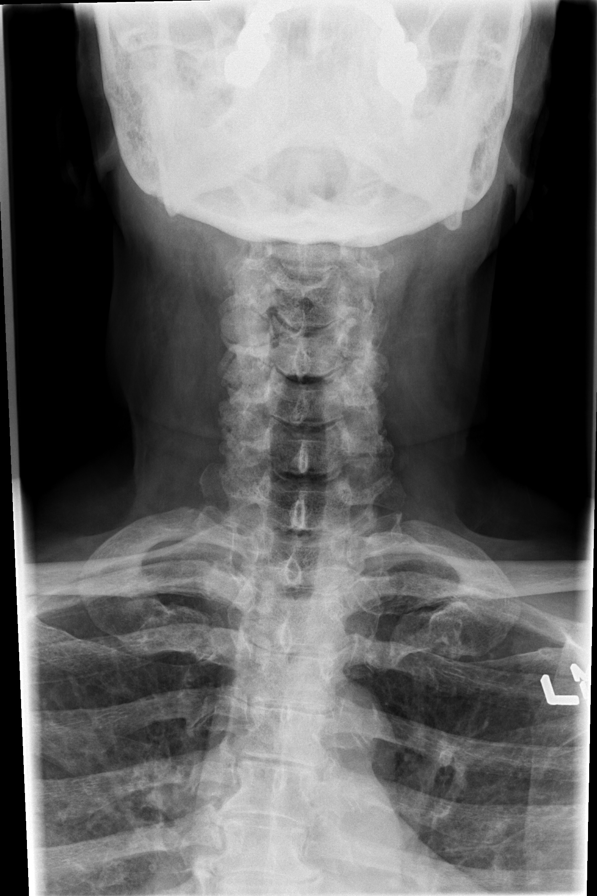

[w shoulder ap external left]
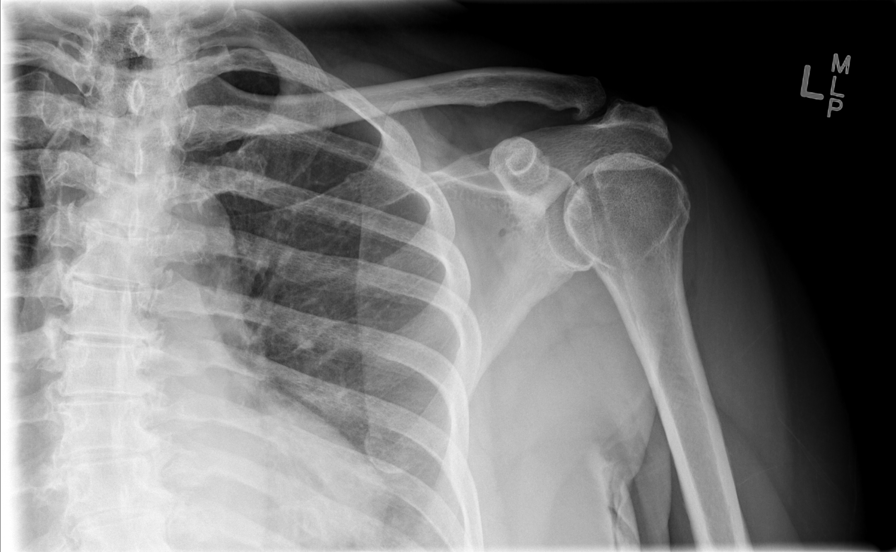

[w shoulder ap external right]
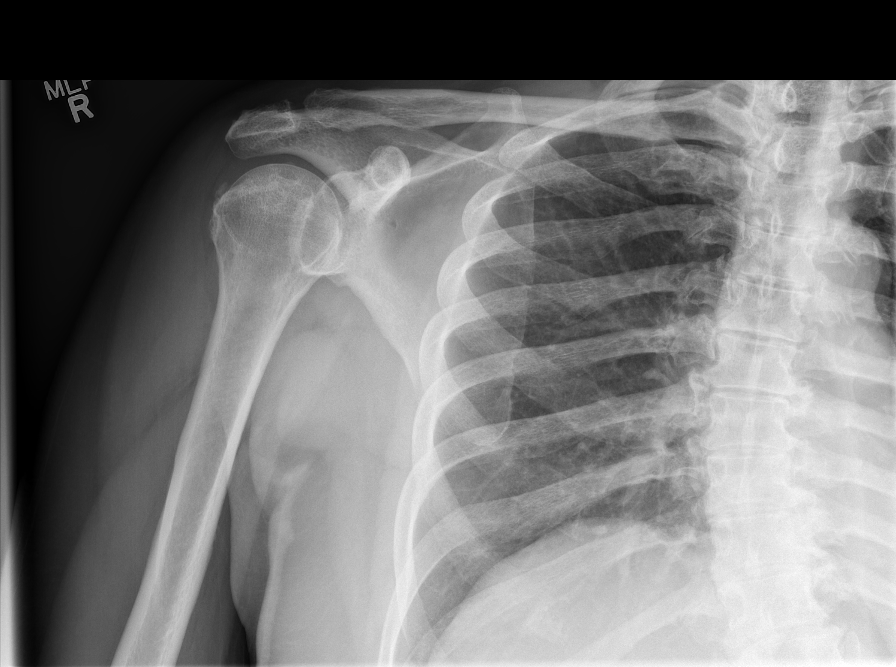

[w humerus ap right]
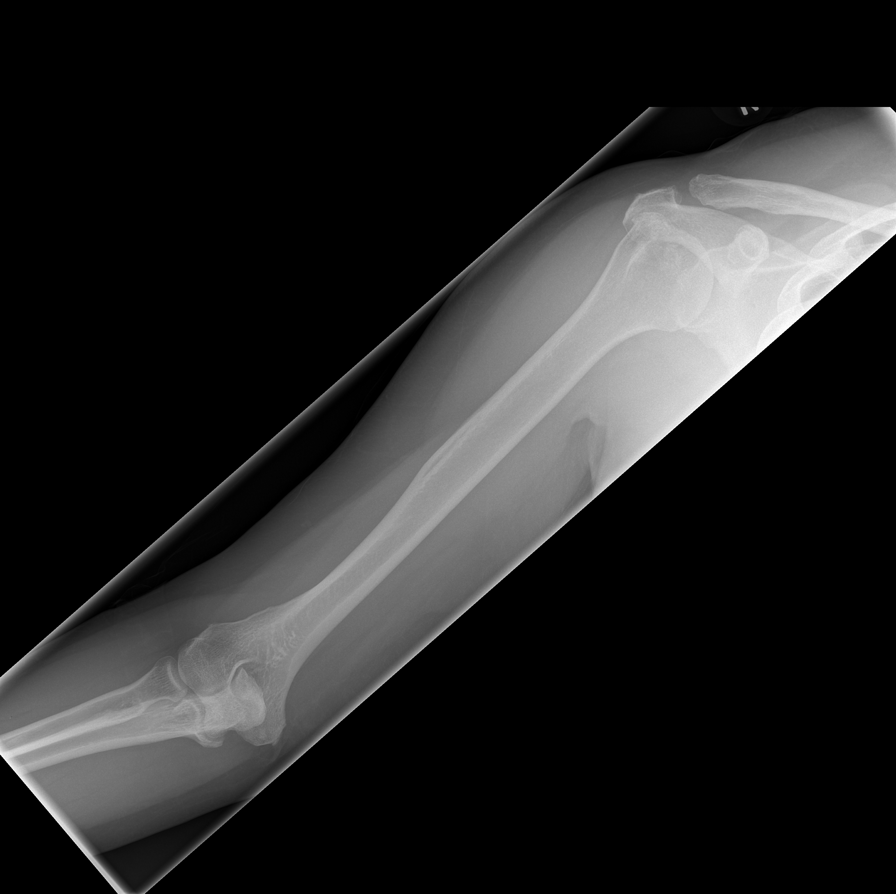

[w forearm ap right]
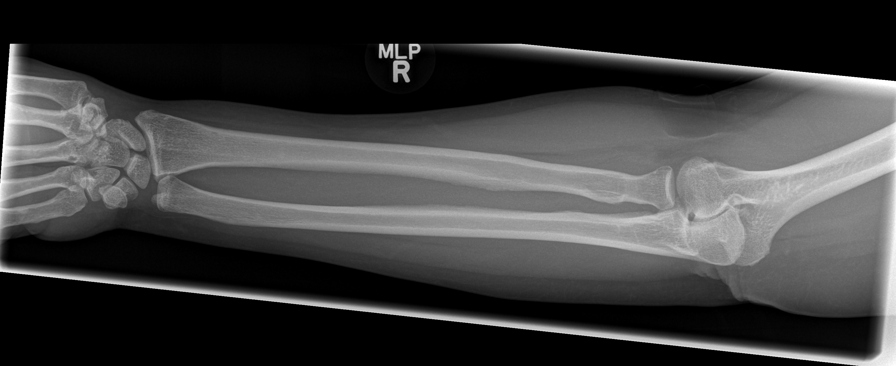

[9 of 10 positions shown; findings below may reference images not displayed]

FINDINGS: Multiple images of the axial and appendicular skeleton obtained. No
focal bony lesion is noted to suggest myeloma or metastatic disease.
Prominent thoracolumbar spine degenerative change and scoliosis
noted. No acute bony abnormality.
IMPRESSION: No evidence of myeloma or metastatic disease.

## 2016-10-01 ENCOUNTER — Other Ambulatory Visit: Payer: Self-pay | Admitting: Internal Medicine

## 2016-10-01 DIAGNOSIS — E2839 Other primary ovarian failure: Secondary | ICD-10-CM

## 2016-10-17 ENCOUNTER — Other Ambulatory Visit: Payer: Medicare Other

## 2016-10-23 ENCOUNTER — Other Ambulatory Visit: Payer: Self-pay | Admitting: Internal Medicine

## 2016-10-23 DIAGNOSIS — Z1231 Encounter for screening mammogram for malignant neoplasm of breast: Secondary | ICD-10-CM

## 2016-10-24 ENCOUNTER — Ambulatory Visit
Admission: RE | Admit: 2016-10-24 | Discharge: 2016-10-24 | Disposition: A | Discharge status: Home / Self Care | Payer: Medicare Other | Source: Ambulatory Visit | Attending: Internal Medicine | Admitting: Internal Medicine

## 2016-10-24 DIAGNOSIS — E2839 Other primary ovarian failure: Secondary | ICD-10-CM

## 2016-11-05 ENCOUNTER — Ambulatory Visit: Payer: Self-pay | Admitting: Rheumatology

## 2016-11-14 ENCOUNTER — Telehealth: Payer: Self-pay | Admitting: Rheumatology

## 2016-11-14 NOTE — Telephone Encounter (Signed)
Patient's PCP faxed her labs today. Patient wants [place] to call her if she needs any other labs drawn

## 2016-11-15 ENCOUNTER — Telehealth: Payer: Self-pay | Admitting: Rheumatology

## 2016-11-15 ENCOUNTER — Ambulatory Visit
Admission: RE | Admit: 2016-11-15 | Discharge: 2016-11-15 | Disposition: A | Discharge status: Home / Self Care | Payer: Medicare Other | Source: Ambulatory Visit | Attending: Internal Medicine | Admitting: Internal Medicine

## 2016-11-15 DIAGNOSIS — Z1231 Encounter for screening mammogram for malignant neoplasm of breast: Secondary | ICD-10-CM

## 2016-11-15 NOTE — Telephone Encounter (Signed)
Patient advised we received part of the labs but not everything. Patient is contacting her PCP to have them refax the labs.

## 2016-11-15 NOTE — Telephone Encounter (Signed)
Patient brought copy of labs into office.

## 2016-11-15 NOTE — Telephone Encounter (Signed)
Patient would like to know if she had the correct labs done.

## 2016-11-15 NOTE — Telephone Encounter (Signed)
[facility] will refax labs on patient.

## 2016-11-18 NOTE — Progress Notes (Signed)
Office Visit Note  Patient: Kimberly Vazquez             Date of Birth: 1946/02/04           MRN: [id number]             PCP: Salena Saner., MD Referring: Willey Blade, MD Visit Date: 11/19/2016 Occupation: @GUAROCC @    Subjective:  Follow-up Follow-up on autoimmune disease  History of Present Illness: Kimberly Vazquez is a 71 y.o. female  Last seen 05/29/2016 and again on 06/19/2016 at which time an ultrasound was done. The ultrasound findings were consistent with inflammatory arthritis. Patient had been noncompliant with Plaquenil and therefore methotrexate was offered. At that visit patient declined switching to methotrexate and wanted to read about it before switching.  Today, patient states that she does not want to take methotrexate. She is discussed this medication with her eye doctor as well as one or 2 other doctors and they've asked the patient to think it over carefully and make up her own decision. They also, according to the patient, asked the patient to discuss with [place].  She also reports that last week starting on Thursday her left third finger became swollen. She describes it as "twice the size as the current digit". It was very painful and warm. It lasted for 3-4 days. She uses Epsom salt soaks and it finally got better on Monday night. Note that she has diagnoses of inflammatory arthritis and an ultrasound showed that back in 05/29/2016 visit.  She also has ongoing pain to bilateral wrist bilateral hands and bilateral feet. Recently her right second and third toe felt red, warm, painful. She thought it could be gout since her sons have gout. However she did not get any lab work done. I advised her that we may want to do uric acid levels to confirm that it was gout but she does want any uric acid levels/labs done at this time. Her symptoms and signs were not consistent with gout however. It were more consistent with inflammatory arthritis as she was having on  her left third finger.  We discussed the Plaquenil is no longer as effective as it needs to be and it would be important for patient to consider switching over to methotrexate. She declines to switch to methotrexate at this time.    Activities of Daily Living:  Patient reports morning stiffness for 60 minutes.   Patient Reports nocturnal pain.  Difficulty dressing/grooming: Reports Difficulty climbing stairs: Reports Difficulty getting out of chair: Reports Difficulty using hands for taps, buttons, cutlery, and/or writing: Reports   Review of Systems  Constitutional: Negative for fatigue.  HENT: Negative for mouth sores and mouth dryness.   Eyes: Negative for dryness.  Respiratory: Negative for shortness of breath.   Gastrointestinal: Negative for constipation and diarrhea.  Musculoskeletal: Negative for myalgias and myalgias.  Skin: Negative for sensitivity to sunlight.  Psychiatric/Behavioral: Negative for decreased concentration and sleep disturbance.    PMFS History:  Patient Active Problem List   Diagnosis Date Noted  . High risk medication use 08/29/2016  . Preventive measure 07/07/2015  . Lupus 06/20/2014  . 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 CKD (chronic kidney disease), stage III 06/20/2014    Past Medical History:  Diagnosis Date  . Allergic rhinitis   . Anemia   . Arthritis   . Bladder disorder    "lazy" , caths twice a day  . Fatty liver   . GERD (gastroesophageal reflux  disease)   . Hypertension   . Seasonal allergies   . Systemic lupus erythematosus ([place])   . UTI (urinary tract infection)   . Vitamin D deficiency     Family History  Problem Relation Age of Onset  . Cancer Brother     unknown  . Kidney disease Son   . Diabetes Brother   . Heart disease Brother   . Colon cancer Neg Hx    Past Surgical History:  Procedure Laterality Date  . ABDOMINAL HYSTERECTOMY    . ROTATOR CUFF REPAIR Right 2012  .  TONSILLECTOMY  1965   Social History   Social History Narrative  . No narrative on file     Objective: Vital Signs: BP (!) 142/79 (BP Location: Left Arm, Patient Position: Sitting, Cuff Size: Normal)   Pulse 93   Resp 15   Ht 5' 1.5" (1.562 m)   Wt 198 lb (89.8 kg)   BMI 36.81 kg/m    Physical Exam  Constitutional: She is oriented to person, place, and time. She appears well-developed and well-nourished.  HENT:  Head: Normocephalic and atraumatic.  Eyes: EOM are normal. Pupils are equal, round, and reactive to light.  Cardiovascular: Normal rate, regular rhythm and normal heart sounds.  Exam reveals no gallop and no friction rub.   No murmur heard. Pulmonary/Chest: Effort normal and breath sounds normal. She has no wheezes. She has no rales.  Abdominal: Soft. Bowel sounds are normal. She exhibits no distension. There is no tenderness. There is no guarding. No hernia.  Musculoskeletal: Normal range of motion. She exhibits no edema, tenderness or deformity.  Lymphadenopathy:    She has no cervical adenopathy.  Neurological: She is alert and oriented to person, place, and time. Coordination normal.  Skin: Skin is warm and dry. Capillary refill takes less than 2 seconds. No rash noted.  Psychiatric: She has a normal mood and affect. Her behavior is normal.  Nursing note and vitals reviewed.    Musculoskeletal Exam:  Full range of motion of all joints Grip strength is equal and strong bilaterally For myalgia tender points are all absent  CDAI Exam: CDAI Homunculus Exam:   Tenderness:  RUE: wrist LUE: wrist Right hand: 1st MCP, 2nd MCP and 3rd MCP Left hand: 1st MCP, 2nd MCP and 3rd MCP  Swelling:  RUE: wrist LUE: wrist Right hand: 1st MCP, 2nd MCP and 3rd MCP Left hand: 1st MCP, 2nd MCP and 3rd MCP  Joint Counts:  CDAI Tender Joint count: 8 CDAI Swollen Joint count: 8  Global Assessments:  Patient Global Assessment: 6 Provider Global Assessment: 6  CDAI  Calculated Score: 28 Swelling and tenderness to bilateral wrists first second and third MCP joints.   Investigation: No additional findings.   Patient has brought in labs from [facility] that were done at her PCPs office. These labs were ordered by Dr. Willey Blade and collected on 09/30/2016. Her insurance no longer accepts/works with [facility]. She is required, if she wants the labs to be paid for, to get them through [facility]. The labs that were ordered were lipid profile, CMP with GFR, CBC with platelet but no differential, lipid levels, myeloperoxidase, hemoglobin A1c, free T4, TSH, vitamin D 25 OH, C-reactive protein further cardiac, serum triiodothyronine. Please see these labs that are entered into her chart for full details  Lab on 08/29/2016  Component Date Value Ref Range Status  . WBC 08/29/2016 4.5  3.8 - 10.8 K/uL Final  . RBC 08/29/2016  4.74  3.80 - 5.10 MIL/uL Final  . Hemoglobin 08/29/2016 11.4* 11.7 - 15.5 g/dL Final  . HCT 08/29/2016 37.3  35.0 - 45.0 % Final  . MCV 08/29/2016 78.7* 80.0 - 100.0 fL Final  . MCH 08/29/2016 24.1* 27.0 - 33.0 pg Final  . MCHC 08/29/2016 30.6* 32.0 - 36.0 g/dL Final  . RDW 08/29/2016 15.3* 11.0 - 15.0 % Final  . Platelets 08/29/2016 258  140 - 400 K/uL Final  . MPV 08/29/2016 10.8  7.5 - 12.5 fL Final  . Neutro Abs 08/29/2016 2250  1,500 - 7,800 cells/uL Final  . Lymphs Abs 08/29/2016 1350  850 - 3,900 cells/uL Final  . Monocytes Absolute 08/29/2016 315  200 - 950 cells/uL Final  . Eosinophils Absolute 08/29/2016 585* 15 - 500 cells/uL Final  . Basophils Absolute 08/29/2016 0  0 - 200 cells/uL Final  . Neutrophils Relative % 08/29/2016 50  % Final  . Lymphocytes Relative 08/29/2016 30  % Final  . Monocytes Relative 08/29/2016 7  % Final  . Eosinophils Relative 08/29/2016 13  % Final  . Basophils Relative 08/29/2016 0  % Final  . Smear Review 08/29/2016 Criteria for review not met   Final  . Sodium 08/29/2016 139  135 - 146 mmol/L  Final  . Potassium 08/29/2016 3.9  3.5 - 5.3 mmol/L Final  . Chloride 08/29/2016 105  98 - 110 mmol/L Final  . CO2 08/29/2016 26  20 - 31 mmol/L Final  . Glucose, Bld 08/29/2016 85  65 - 99 mg/dL Final  . BUN 08/29/2016 12  7 - 25 mg/dL Final  . Creat 08/29/2016 0.88  0.60 - 0.93 mg/dL Final   Comment:   For patients > or = 71 years of age: The upper reference limit for Creatinine is approximately 13% higher for people identified as African-American.     . Total Bilirubin 08/29/2016 0.5  0.2 - 1.2 mg/dL Final  . Alkaline Phosphatase 08/29/2016 71  33 - 130 U/L Final  . AST 08/29/2016 18  10 - 35 U/L Final  . ALT 08/29/2016 10  6 - 29 U/L Final  . Total Protein 08/29/2016 6.6  6.1 - 8.1 g/dL Final  . Albumin 08/29/2016 3.6  3.6 - 5.1 g/dL Final  . Calcium 08/29/2016 8.7  8.6 - 10.4 mg/dL Final  . GFR, Est African American 08/29/2016 77  >=60 mL/min Final  . GFR, Est Non African American 08/29/2016 67  >=60 mL/min Final  Appointment on 06/28/2016  Component Date Value Ref Range Status  . Ig Kappa Free Light Chain 06/28/2016 58.4* 3.3 - 19.4 mg/L Final  . Ig Lambda Free Light Chain 06/28/2016 22.7  5.7 - 26.3 mg/L Final  . Kappa/Lambda FluidC Ratio 06/28/2016 2.57* 0.26 - 1.65 Final  . WBC 06/28/2016 5.2  3.9 - 10.3 10e3/uL Final  . NEUT# 06/28/2016 2.6  1.5 - 6.5 10e3/uL Final  . HGB 06/28/2016 12.2  11.6 - 15.9 g/dL Final  . HCT 06/28/2016 39.2  34.8 - 46.6 % Final  . Platelets 06/28/2016 239  145 - 400 10e3/uL Final  . MCV 06/28/2016 77.0* 79.5 - 101.0 fL Final  . MCH 06/28/2016 23.9* 25.1 - 34.0 pg Final  . MCHC 06/28/2016 31.0* 31.5 - 36.0 g/dL Final  . RBC 06/28/2016 5.09  3.70 - 5.45 10e6/uL Final  . RDW 06/28/2016 16.3* 11.2 - 14.5 % Final  . lymph# 06/28/2016 1.5  0.9 - 3.3 10e3/uL Final  . MONO# 06/28/2016 0.3  0.1 -  0.9 10e3/uL Final  . Eosinophils Absolute 06/28/2016 0.7* 0.0 - 0.5 10e3/uL Final  . Basophils Absolute 06/28/2016 0.1  0.0 - 0.1 10e3/uL Final  . NEUT%  06/28/2016 50.8  38.4 - 76.8 % Final  . LYMPH% 06/28/2016 29.4  14.0 - 49.7 % Final  . MONO% 06/28/2016 6.1  0.0 - 14.0 % Final  . EOS% 06/28/2016 12.7* 0.0 - 7.0 % Final  . BASO% 06/28/2016 1.0  0.0 - 2.0 % Final  . IgG, Qn, Serum 06/28/2016 1,566  700 - 1600 mg/dL Final  . IgA, Qn, Serum 06/28/2016 202  87 - 352 mg/dL Final  . IgM, Qn, Serum 06/28/2016 15* 26 - 217 mg/dL Final  . Total Protein 06/28/2016 6.8  6.0 - 8.5 g/dL Final  . Albumin SerPl Elph-Mcnc 06/28/2016 3.5  2.9 - 4.4 g/dL Final  . Alpha 1 01/36/5784 0.2  0.0 - 0.4 g/dL Final  . Alpha2 Glob SerPl Elph-Mcnc 06/28/2016 0.7  0.4 - 1.0 g/dL Final  . B-Globulin SerPl Elph-Mcnc 06/28/2016 1.1  0.7 - 1.3 g/dL Final  . Gamma Glob SerPl Elph-Mcnc 06/28/2016 1.3  0.4 - 1.8 g/dL Final  . M Protein SerPl Elph-Mcnc 06/28/2016 Not Observed  Not Observed g/dL Final  . Globulin, Total 06/28/2016 3.3  2.2 - 3.9 g/dL Final  . Albumin/Glob SerPl 06/28/2016 1.1  0.7 - 1.7 Final  . IFE 1 06/28/2016 Comment   Final  . Please Note 06/28/2016 Comment   Final   Comment: Protein electrophoresis scan will follow via computer, mail, or courier delivery.   . Sodium 06/28/2016 140  136 - 145 mEq/L Final  . Potassium 06/28/2016 3.6  3.5 - 5.1 mEq/L Final  . Chloride 06/28/2016 105  98 - 109 mEq/L Final  . CO2 06/28/2016 26  22 - 29 mEq/L Final  . Glucose 06/28/2016 84  70 - 140 mg/dl Final  . BUN 29/27/7262 13.5  7.0 - 26.0 mg/dL Final  . Creatinine 42/06/1152 0.8  0.6 - 1.1 mg/dL Final  . Total Bilirubin 06/28/2016 0.55  0.20 - 1.20 mg/dL Final  . Alkaline Phosphatase 06/28/2016 88  40 - 150 U/L Final  . AST 06/28/2016 22  5 - 34 U/L Final  . ALT 06/28/2016 16  0 - 55 U/L Final  . Total Protein 06/28/2016 7.5  6.4 - 8.3 g/dL Final  . Albumin 51/48/2598 3.2* 3.5 - 5.0 g/dL Final  . Calcium 37/88/9964 9.2  8.4 - 10.4 mg/dL Final  . Anion Gap 98/51/9034 9  3 - 11 mEq/L Final  . EGFR 06/28/2016 83* >90 ml/min/1.73 m2 Final     Imaging: Dg  Bone Density (dxa)  Result Date: 10/24/2016 EXAM: DUAL X-RAY ABSORPTIOMETRY (DXA) FOR BONE MINERAL DENSITY IMPRESSION: Referring Physician:  Andi Devon PATIENT: Name: Dalya, Maselli Patient ID: [id number] Birth Date: 10/04/1946 Height: 61.5 in. Sex: Female Measured: 10/24/2016 Weight: 198.0 lbs. Indications: Advanced Age, Estrogen Deficient, Height Loss (781.91), High risk medication use, Hx of tobacco use, Hysterectomy, Low Calcium Intake (269.3), Lupus, One ovary removed, Postmenopausal, Secondary Osteoporosis Fractures: None Treatments: Vitamin D (E933.5) ASSESSMENT: The BMD measured at Femur Neck Right is 1.145 g/cm2 with a T-score of 0.8. This patient is considered normal according to World Health Organization [facility]) criteria. Lumbar spine was not utilized due to advanced degenerative changes. Patient does not meet criteria for FRAX assessment. Site Region Measured Date Measured Age YA BMD Significant CHANGE T-score DualFemur Neck Right 10/24/2016 70.4 0.8 1.145 g/cm2 Left Forearm Radius 33% 10/24/2016 70.4 1.2 0.995  g/cm2 World Health Organization [facility]) criteria for post-menopausal, Caucasian Women: Normal       T-score at or above -1 SD Osteopenia   T-score between -1 and -2.5 SD Osteoporosis T-score at or below -2.5 SD RECOMMENDATION: [place] recommends that FDA-approved medical therapies be considered in postmenopausal women and men age 52 or older with a: 1. Hip or vertebral (clinical or morphometric) fracture. 2. T-score of <-2.5 at the spine or hip. 3. Ten-year fracture probability by FRAX of 3% or greater for hip fracture or 20% or greater for major osteoporotic fracture. All treatment decisions require clinical judgment and consideration of individual patient factors, including patient preferences, co-morbidities, previous drug use, risk factors not captured in the FRAX model (e.g. falls, vitamin D deficiency, increased bone turnover, interval significant decline in  bone density) and possible under - or over-estimation of fracture risk by FRAX. All patients should ensure an adequate intake of dietary calcium (1200 mg/d) and vitamin D (800 IU daily) unless contraindicated. FOLLOW-UP: People with diagnosed cases of osteoporosis or at high risk for fracture should have regular bone mineral density tests. For patients eligible for Medicare, routine testing is allowed once every 2 years. The testing frequency can be increased to one year for patients who have rapidly progressing disease, those who are receiving or discontinuing medical therapy to restore bone mass, or have additional risk factors. I have reviewed this report, and agree with the above findings. [facility] Electronically Signed   By: Lowella Grip III M.D.   On: 10/24/2016 15:36   Mm Screening Breast Tomo Bilateral  Result Date: 11/15/2016 CLINICAL DATA:  Screening. EXAM: 2D DIGITAL SCREENING BILATERAL MAMMOGRAM WITH CAD AND ADJUNCT TOMO COMPARISON:  Previous exam(s). ACR Breast Density Category c: The breast tissue is heterogeneously dense, which may obscure small masses. FINDINGS: There are no findings suspicious for malignancy. Images were processed with CAD. IMPRESSION: No mammographic evidence of malignancy. A result letter of this screening mammogram will be mailed directly to the patient. RECOMMENDATION: Screening mammogram in one year. (Code:SM-B-01Y) BI-RADS CATEGORY  1: Negative. Electronically Signed   By: Fidela Salisbury M.D.   On: 11/15/2016 16:31    Speciality Comments: No specialty comments available.    Procedures:  No procedures performed Allergies: Patient has no known allergies.   Assessment / Plan:     Visit Diagnoses: History of chronic inflammatory arthritis - 06/19/2016: Ultrasound showed inflammatory arthritis. Plaquenil not effective. Methotrexate offered. Patient will consider.  Autoimmune disease ([place]) - +ANA; +RF; -CCP;  High risk medication use -  11/19/2016: Hx noncompliance w/ PLQ in past; Offered MTX Aug 2017, pt declined.  DJD (degenerative joint disease), cervical  Spondylosis of lumbar region without myelopathy or radiculopathy   Plan: #1: History of inflammatory arthritis. Please see history of present illness for full details. Patient has been noncompliant with Plaquenil. We offered methotrexate back in August 2017. If we decide to move 4 with methotrexate, patient will need updated labs that include Hepatitis panel, TB gold, chest x-ray in addition to the CBC with differential and CMP with GFR. (NOTE:  PT DECLINES MTX AT THIS TIME; WE DISCUSSED THE RISK AND BENEFITS OF USING AND NOT USING MTX FOR HER A.D.)   #2: CBC with differential CMP with GFR urinalysis to be done at the next office visit in April/May 2018.  #3: Patient declined methotrexate and wants to stay on Plaquenil. She needs a Plaquenil refill. She needs to do milligrams twice a day Monday through Friday  and I've given her appropriate amount of medication to last her for 90 days with a refill.  #4: She will need autoimmune workup once again in August 2018 that include sedimentation rate, ANA with titer, ENA, C3-C4, CBC with differential and CMP with GFR and urinalysis.  #5: Return to clinic in 5 months    Orders: No orders of the defined types were placed in this encounter.  Meds ordered this encounter  Medications  . hydroxychloroquine (PLAQUENIL) 200 MG tablet    Sig: 200 mg  Bid, Monday through Friday; none on Saturday or sunday    Dispense:  135 tablet    Refill:  1    Order Specific Question:   Supervising Provider    Answer:   Lyda Perone    Face-to-face time spent with patient was 30 minutes. 50% of time was spent in counseling and coordination of care.  Follow-Up Instructions: Return in about 4 months (around 03/19/2017) for A.D.; Infl. Arthr, PLQ non-compl; Marland Kitchen   Eliezer Lofts, PA-C I examined and evaluated the patient  with Eliezer Lofts PA.Patient gives history of intermittent swelling. She had some synovial thickening but no synovitis on examination today. At this point she will continue Plaquenil and if need be we can add methotrexate in future. She was in agreement with the plan. The plan of care was discussed as noted above.  Bo Merino, MD Note - This record has been created using Editor, commissioning.  Chart creation errors have been sought, but may not always  have been located. Such creation errors do not reflect on  the standard of medical care.

## 2016-11-19 ENCOUNTER — Ambulatory Visit (INDEPENDENT_AMBULATORY_CARE_PROVIDER_SITE_OTHER): Payer: Medicare Other | Admitting: Rheumatology

## 2016-11-19 ENCOUNTER — Encounter: Payer: Self-pay | Admitting: Rheumatology

## 2016-11-19 VITALS — BP 142/79 | HR 93 | Resp 15 | Ht 61.5 in | Wt 198.0 lb

## 2016-11-19 DIAGNOSIS — M47812 Spondylosis without myelopathy or radiculopathy, cervical region: Secondary | ICD-10-CM

## 2016-11-19 DIAGNOSIS — Z79899 Other long term (current) drug therapy: Secondary | ICD-10-CM | POA: Diagnosis not present

## 2016-11-19 DIAGNOSIS — M359 Systemic involvement of connective tissue, unspecified: Secondary | ICD-10-CM | POA: Diagnosis not present

## 2016-11-19 DIAGNOSIS — Z8739 Personal history of other diseases of the musculoskeletal system and connective tissue: Secondary | ICD-10-CM | POA: Diagnosis not present

## 2016-11-19 DIAGNOSIS — M5030 Other cervical disc degeneration, unspecified cervical region: Secondary | ICD-10-CM | POA: Diagnosis not present

## 2016-11-19 DIAGNOSIS — M47816 Spondylosis without myelopathy or radiculopathy, lumbar region: Secondary | ICD-10-CM | POA: Diagnosis not present

## 2016-11-19 MED ORDER — HYDROXYCHLOROQUINE SULFATE 200 MG PO TABS: ORAL_TABLET | ORAL | 1 refills | Status: DC

## 2016-11-22 ENCOUNTER — Other Ambulatory Visit: Payer: Self-pay | Admitting: Internal Medicine

## 2016-11-22 DIAGNOSIS — M79605 Pain in left leg: Secondary | ICD-10-CM

## 2016-11-22 DIAGNOSIS — M79604 Pain in right leg: Secondary | ICD-10-CM

## 2016-11-24 ENCOUNTER — Other Ambulatory Visit: Payer: Self-pay | Admitting: Rheumatology

## 2016-11-25 NOTE — Telephone Encounter (Signed)
Last Visit: 11/19/16 Next Visit: 03/19/17 Labs: 09/30/16 WNL PLQ Eye Exam: 08/02/16 WNL  Okay to refill PLQ?  

## 2016-11-28 ENCOUNTER — Ambulatory Visit
Admission: RE | Admit: 2016-11-28 | Discharge: 2016-11-28 | Disposition: A | Discharge status: Home / Self Care | Payer: Medicare Other | Source: Ambulatory Visit | Attending: Internal Medicine | Admitting: Internal Medicine

## 2016-11-28 DIAGNOSIS — M79605 Pain in left leg: Secondary | ICD-10-CM

## 2016-11-28 DIAGNOSIS — M79604 Pain in right leg: Secondary | ICD-10-CM

## 2017-02-22 ENCOUNTER — Other Ambulatory Visit: Payer: Self-pay | Admitting: Rheumatology

## 2017-02-24 NOTE — Telephone Encounter (Signed)
ok 

## 2017-02-24 NOTE — Telephone Encounter (Signed)
Last Visit: 11/19/16 Next Visit: 03/19/17 Labs: 09/30/16 WNL PLQ Eye Exam: 08/02/16 WNL  Okay to refill PLQ?

## 2017-03-07 DIAGNOSIS — R768 Other specified abnormal immunological findings in serum: Secondary | ICD-10-CM | POA: Insufficient documentation

## 2017-03-07 DIAGNOSIS — Z87440 Personal history of urinary (tract) infections: Secondary | ICD-10-CM | POA: Insufficient documentation

## 2017-03-07 DIAGNOSIS — Z9114 Patient's other noncompliance with medication regimen: Secondary | ICD-10-CM | POA: Insufficient documentation

## 2017-03-07 DIAGNOSIS — M19041 Primary osteoarthritis, right hand: Secondary | ICD-10-CM | POA: Insufficient documentation

## 2017-03-07 DIAGNOSIS — F5101 Primary insomnia: Secondary | ICD-10-CM | POA: Insufficient documentation

## 2017-03-07 DIAGNOSIS — E559 Vitamin D deficiency, unspecified: Secondary | ICD-10-CM | POA: Insufficient documentation

## 2017-03-07 DIAGNOSIS — M19042 Primary osteoarthritis, left hand: Secondary | ICD-10-CM | POA: Insufficient documentation

## 2017-03-07 DIAGNOSIS — Z91148 Patient's other noncompliance with medication regimen for other reason: Secondary | ICD-10-CM | POA: Insufficient documentation

## 2017-03-07 DIAGNOSIS — M170 Bilateral primary osteoarthritis of knee: Secondary | ICD-10-CM | POA: Insufficient documentation

## 2017-03-07 NOTE — Progress Notes (Deleted)
Office Visit Note  Patient: Kimberly Vazquez             Date of Birth: 1946-04-19           MRN: [id number]             PCP: Willey Blade, MD Referring: Willey Blade, MD Visit Date: 03/19/2017 Occupation: @GUAROCC @    Subjective:  No chief complaint on file.   History of Present Illness: Kimberly Vazquez is a 71 y.o. female ***   Activities of Daily Living:  Patient reports morning stiffness for *** {minute/hour:19697}.   Patient {ACTIONS;DENIES/REPORTS:21021675::"Denies"} nocturnal pain.  Difficulty dressing/grooming: {ACTIONS;DENIES/REPORTS:21021675::"Denies"} Difficulty climbing stairs: {ACTIONS;DENIES/REPORTS:21021675::"Denies"} Difficulty getting out of chair: {ACTIONS;DENIES/REPORTS:21021675::"Denies"} Difficulty using hands for taps, buttons, cutlery, and/or writing: {ACTIONS;DENIES/REPORTS:21021675::"Denies"}   No Rheumatology ROS completed.   PMFS History:  Patient Active Problem List   Diagnosis Date Noted  . High risk medication use 08/29/2016  . Preventive measure 07/07/2015  . Lupus 06/20/2014  . 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 CKD (chronic kidney disease), stage III 06/20/2014    Past Medical History:  Diagnosis Date  . Allergic rhinitis   . Anemia   . Arthritis   . Bladder disorder    "lazy" , caths twice a day  . Fatty liver   . GERD (gastroesophageal reflux disease)   . Hypertension   . Seasonal allergies   . Systemic lupus erythematosus ([place])   . UTI (urinary tract infection)   . Vitamin D deficiency     Family History  Problem Relation Age of Onset  . Cancer Brother        unknown  . Kidney disease Son   . Diabetes Brother   . Heart disease Brother   . Colon cancer Neg Hx    Past Surgical History:  Procedure Laterality Date  . ABDOMINAL HYSTERECTOMY    . ROTATOR CUFF REPAIR Right 2012  . TONSILLECTOMY  1965   Social History   Social History Narrative  . No narrative on file      Objective: Vital Signs: There were no vitals taken for this visit.   Physical Exam   Musculoskeletal Exam: ***  CDAI Exam: No CDAI exam completed.    Investigation: Findings:  06/19/2016 After informed consent was obtained per EULAR recommendation, ultrasound examination of the bilateral hands was performed.  Using 12 MHz transducer, grayscale and power Doppler, bilateral 2nd, 3rd and 5th MCP joints and bilateral wrist joints both dorsal and volar aspects were evaluated.  The findings were she had some synovial thickening in her right 3rd and 5th MCP joint.  She had mild synovitis in bilateral 3rd MCP joint.  There was no synovitis in her wrist joint.  Her bilateral median nerves were bifid.  Right median nerve was 0.09 cm square and left was 0.08 cm square which was within normal limits.  These findings were consistent with mild inflammatory arthritis.     Ultrasound examination of bilateral feet was also performed per EULAR recommendation.  Using 12 MHz transducer, grayscale and power Doppler, bilateral 1st, 2nd, 4th, 5th MTP joints both dorsal and volar aspects were evaluated.  The findings were she had right first MTP joint narrowing and spurring.  There was synovitis in her bilateral 1st, 2nd, 4th MTP joints.  These findings were consistent with inflammatory arthritis.     IMPRESSION AND PLAN:  The patient has inflammatory arthritis which is not controlled by Plaquenil.  We discussed possible use of methotrexate.  Labs from August 2017:  ANA was 1:1280 nuclear homogenous pattern.  ENA was negative.  Complements were normal.  Vitamin D was normal.  Sed rate 36, which was elevated, normal being 30.    Imaging: No results found.  Speciality Comments: No specialty comments available.    Procedures:  No procedures performed Allergies: Patient has no known allergies.   Assessment / Plan:     Visit Diagnoses: No diagnosis found.    Orders: No orders of the defined types  were placed in this encounter.  No orders of the defined types were placed in this encounter.   Face-to-face time spent with patient was *** minutes. 50% of time was spent in counseling and coordination of care.  Follow-Up Instructions: No Follow-up on file.   Amy Littrell, RT  Note - This record has been created using [employer].  Chart creation errors have been sought, but may not always  have been located. Such creation errors do not reflect on  the standard of medical care.

## 2017-03-18 ENCOUNTER — Encounter: Payer: Self-pay | Admitting: Rheumatology

## 2017-03-18 ENCOUNTER — Ambulatory Visit (INDEPENDENT_AMBULATORY_CARE_PROVIDER_SITE_OTHER): Payer: Medicare Other | Admitting: Rheumatology

## 2017-03-18 VITALS — BP 138/78 | HR 74 | Resp 16 | Ht 61.5 in | Wt 194.0 lb

## 2017-03-18 DIAGNOSIS — Z79899 Other long term (current) drug therapy: Secondary | ICD-10-CM

## 2017-03-18 DIAGNOSIS — R768 Other specified abnormal immunological findings in serum: Secondary | ICD-10-CM

## 2017-03-18 DIAGNOSIS — D8989 Other specified disorders involving the immune mechanism, not elsewhere classified: Secondary | ICD-10-CM

## 2017-03-18 DIAGNOSIS — E559 Vitamin D deficiency, unspecified: Secondary | ICD-10-CM | POA: Diagnosis not present

## 2017-03-18 DIAGNOSIS — M359 Systemic involvement of connective tissue, unspecified: Secondary | ICD-10-CM

## 2017-03-18 MED ORDER — HYDROXYCHLOROQUINE SULFATE 200 MG PO TABS: ORAL_TABLET | ORAL | 1 refills | Status: DC

## 2017-03-18 NOTE — Progress Notes (Signed)
Office Visit Note  Patient: Kimberly Vazquez             Date of Birth: 1946/09/20           MRN: [id number]             PCP: Willey Blade, MD Referring: Willey Blade, MD Visit Date: 03/18/2017 Occupation: @GUAROCC @    Subjective:  Medication Management (review PLQ dose )   History of Present Illness: Kimberly Vazquez is a 71 y.o. female  Who was last seen in our office on 11/19/2016.  Please see the last note in Epic for full details.  She has been diagnosed with inflammatory arthritis based on her ultrasound. She has been given Plaquenil to take 200 mg twice a day Monday through Friday. Patient has been taking it 7 days a week and set of 5 days a week. At one time she did have a left third finger swollen and we felt that maybe Plaquenil was not effective enough and we offered her methotrexate way back in August 2017 but at that time patient declined.  She also has autoimmune disease with positive ANA.  Today, patient states she is doing great with her inflammatory arthritis and autoimmune disease. As far as inflammatory arthritis is concerned, no joint pain, swelling, stiffness. As far as autoimmune disease is concerned, no oral or nasal ulcers, no new rashes, no Raynaud's symptoms.  Over the last 2-3 weeks, patient has been struggling with bronchitis. She saw Dr. Karlton Lemon who gave her Symbicort, amoxicillin, cough medicine as well as an injection while she was in her office. At this time patient is starting to feel somewhat better but she has struggled over the last 2-3 weeks with the bronchitis.   Activities of Daily Living:  Patient reports morning stiffness for 15 minutes.   Patient Denies nocturnal pain.  Difficulty dressing/grooming: Denies Difficulty climbing stairs: Denies Difficulty getting out of chair: Denies Difficulty using hands for taps, buttons, cutlery, and/or writing: Denies   Review of Systems  Constitutional: Negative for fatigue.  HENT:  Negative for mouth sores and mouth dryness.   Eyes: Negative for dryness.  Respiratory: Negative for shortness of breath.   Gastrointestinal: Negative for constipation and diarrhea.  Musculoskeletal: Negative for myalgias and myalgias.  Skin: Negative for sensitivity to sunlight.  Psychiatric/Behavioral: Negative for decreased concentration and sleep disturbance.    PMFS History:  Patient Active Problem List   Diagnosis Date Noted  . Noncompliance with medication regimen 03/07/2017  . Rheumatoid factor positive 03/07/2017  . Primary insomnia 03/07/2017  . Primary osteoarthritis of both hands 03/07/2017  . Primary osteoarthritis of both knees 03/07/2017  . History of recurrent UTIs 03/07/2017  . Vitamin D deficiency 03/07/2017  . High risk medication use 08/29/2016  . Preventive measure 07/07/2015  . Autoimmune disease ([place]) 06/20/2014  . 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 CKD (chronic kidney disease), stage III 06/20/2014    Past Medical History:  Diagnosis Date  . Allergic rhinitis   . Anemia   . Arthritis   . Bladder disorder    "lazy" , caths twice a day  . Fatty liver   . GERD (gastroesophageal reflux disease)   . Hypertension   . Seasonal allergies   . Systemic lupus erythematosus ([place])   . UTI (urinary tract infection)   . Vitamin D deficiency     Family History  Problem Relation Age of Onset  . Cancer Brother  unknown  . Kidney disease Son   . Diabetes Brother   . Heart disease Brother   . Colon cancer Neg Hx    Past Surgical History:  Procedure Laterality Date  . ABDOMINAL HYSTERECTOMY    . ROTATOR CUFF REPAIR Right 2012  . TONSILLECTOMY  1965   Social History   Social History Narrative  . No narrative on file     Objective: Vital Signs: BP 138/78   Pulse 74   Resp 16   Ht 5' 1.5" (1.562 m)   Wt 194 lb (88 kg)   BMI 36.06 kg/m    Physical Exam  Constitutional: She is oriented to person, place, and  time. She appears well-developed and well-nourished.  HENT:  Head: Normocephalic and atraumatic.  Eyes: EOM are normal. Pupils are equal, round, and reactive to light.  Cardiovascular: Normal rate, regular rhythm and normal heart sounds.  Exam reveals no gallop and no friction rub.   No murmur heard. Pulmonary/Chest: Effort normal and breath sounds normal. She has no wheezes. She has no rales.  Abdominal: Soft. Bowel sounds are normal. She exhibits no distension. There is no tenderness. There is no guarding. No hernia.  Musculoskeletal: Normal range of motion. She exhibits no edema, tenderness or deformity.  Lymphadenopathy:    She has no cervical adenopathy.  Neurological: She is alert and oriented to person, place, and time. Coordination normal.  Skin: Skin is warm and dry. Capillary refill takes less than 2 seconds. No rash noted.  Psychiatric: She has a normal mood and affect. Her behavior is normal.  Nursing note and vitals reviewed.    Musculoskeletal Exam:  Full range of motion of all joints Grip strength is equal and strong bilaterally Fiber myalgia tender points are all absent  CDAI Exam: No CDAI exam completed.  No synovitis on exam  Investigation: No additional findings. No visits with results within 6 Month(s) from this visit.  Latest known visit with results is:  Lab on 08/29/2016  Component Date Value Ref Range Status  . WBC 08/29/2016 4.5  3.8 - 10.8 K/uL Final  . RBC 08/29/2016 4.74  3.80 - 5.10 MIL/uL Final  . Hemoglobin 08/29/2016 11.4* 11.7 - 15.5 g/dL Final  . HCT 08/29/2016 37.3  35.0 - 45.0 % Final  . MCV 08/29/2016 78.7* 80.0 - 100.0 fL Final  . MCH 08/29/2016 24.1* 27.0 - 33.0 pg Final  . MCHC 08/29/2016 30.6* 32.0 - 36.0 g/dL Final  . RDW 08/29/2016 15.3* 11.0 - 15.0 % Final  . Platelets 08/29/2016 258  140 - 400 K/uL Final  . MPV 08/29/2016 10.8  7.5 - 12.5 fL Final  . Neutro Abs 08/29/2016 2250  1,500 - 7,800 cells/uL Final  . Lymphs Abs  08/29/2016 1350  850 - 3,900 cells/uL Final  . Monocytes Absolute 08/29/2016 315  200 - 950 cells/uL Final  . Eosinophils Absolute 08/29/2016 585* 15 - 500 cells/uL Final  . Basophils Absolute 08/29/2016 0  0 - 200 cells/uL Final  . Neutrophils Relative % 08/29/2016 50  % Final  . Lymphocytes Relative 08/29/2016 30  % Final  . Monocytes Relative 08/29/2016 7  % Final  . Eosinophils Relative 08/29/2016 13  % Final  . Basophils Relative 08/29/2016 0  % Final  . Smear Review 08/29/2016 Criteria for review not met   Final  . Sodium 08/29/2016 139  135 - 146 mmol/L Final  . Potassium 08/29/2016 3.9  3.5 - 5.3 mmol/L Final  . Chloride 08/29/2016 105  98 - 110 mmol/L Final  . CO2 08/29/2016 26  20 - 31 mmol/L Final  . Glucose, Bld 08/29/2016 85  65 - 99 mg/dL Final  . BUN 08/29/2016 12  7 - 25 mg/dL Final  . Creat 08/29/2016 0.88  0.60 - 0.93 mg/dL Final   Comment:   For patients > or = 71 years of age: The upper reference limit for Creatinine is approximately 13% higher for people identified as African-American.     . Total Bilirubin 08/29/2016 0.5  0.2 - 1.2 mg/dL Final  . Alkaline Phosphatase 08/29/2016 71  33 - 130 U/L Final  . AST 08/29/2016 18  10 - 35 U/L Final  . ALT 08/29/2016 10  6 - 29 U/L Final  . Total Protein 08/29/2016 6.6  6.1 - 8.1 g/dL Final  . Albumin 08/29/2016 3.6  3.6 - 5.1 g/dL Final  . Calcium 08/29/2016 8.7  8.6 - 10.4 mg/dL Final  . GFR, Est African American 08/29/2016 77  >=60 mL/min Final  . GFR, Est Non African American 08/29/2016 67  >=60 mL/min Final     Imaging: No results found.  Speciality Comments: No specialty comments available.    Procedures:  No procedures performed Allergies: Patient has no known allergies.   Assessment / Plan:     Visit Diagnoses: Autoimmune disease ([place]) - Plan: CBC with Differential/Platelet, CMP14+EGFR, Urinalysis, Routine w reflex microscopic, CBC with Differential/Platelet, CMP14+EGFR, Urinalysis, Routine w reflex  microscopic  High risk medication use - Plan: CBC with Differential/Platelet, CMP14+EGFR, Urinalysis, Routine w reflex microscopic, CBC with Differential/Platelet, CMP14+EGFR, Urinalysis, Routine w reflex microscopic  Rheumatoid factor positive  Vitamin D deficiency   Plan: #1: History of inflammatory arthritis. No joint pain swelling or stiffness. Doing well with Plaquenil 200 mg twice a day 7 days a week. Adequate response with Plaquenil alone. I've advised the patient to take Plaquenil 20 mg 5 days a week (skip Saturday and Sunday) Patient is agreeable If she has any flares, we can discuss methotrexate in the future but in the past since August 2017, patient has elected not to use methotrexate and prefers to stay on Plaquenil especially since she is doing so well.    #2: CBC with differential;  CMP with GFR;  urinalysis to be done via [facility] at dr shelton's office( April/May 2018.) Patient self catheterizes to get the urine since she has problems urinating without the catheterization. She has her own catheter in the car and she'll get that to do the tests for [place].   #3: She needs to do 200 milligrams twice a day Monday through Friday and I've given her appropriate amount of medication to last her for 90 days with a refill. Patient saw Dr. Zenia Resides office in January 2018. Patient reports that her eye exam was normal for Plaquenil toxicity. I do need a documentation and patient will go by their office and get that taken care of today.  #4: She will need autoimmune workup once again in Sept 2018 that include sedimentation rate, ANA with titer, ENA, C3-C4, CBC with differential and CMP with GFR and urinalysis via [facility].  #5: Return to clinic in 49month;  we'll do autoimmune workup at f-u (last workup was done 05/29/2016; C SRS for full details:::  #6: Continue to see Dr. GAlvy Bimler hematologist/oncologist for kappa/lambda light chain follow-up as scheduled  Orders: Orders Placed  This Encounter  Procedures  . CBC with Differential/Platelet  . CMP14+EGFR  . Urinalysis, Routine w reflex microscopic   Meds  ordered this encounter  Medications  . hydroxychloroquine (PLAQUENIL) 200 MG tablet    Sig: 200 mg  Bid, Monday through Friday; none on Saturday or sunday    Dispense:  135 tablet    Refill:  1    Order Specific Question:   Supervising Provider    Answer:   Lyda Perone    Face-to-face time spent with patient was 30 minutes. 50% of time was spent in counseling and coordination of care.  Follow-Up Instructions: Return in about 4 months (around 07/19/2017) for RA,A.D.; plq 200bid m-f; adeq response; .   Khai Torbert, PA-C   Pt. Is doing well on PLQ. Will continue current tt. She had no synovitis on exam today. I examined and evaluated the patient with Eliezer Lofts PA. The plan of care was discussed as noted above.  Bo Merino, MD Note - This record has been created using Editor, commissioning.  Chart creation errors have been sought, but may not always  have been located. Such creation errors do not reflect on  the standard of medical care.

## 2017-03-19 ENCOUNTER — Ambulatory Visit: Payer: Medicare Other | Admitting: Rheumatology

## 2017-04-03 ENCOUNTER — Telehealth: Payer: Self-pay | Admitting: Rheumatology

## 2017-04-03 NOTE — Telephone Encounter (Signed)
Patient called wanting to know if we received her lab results from her doctor, Dr. Willey Blade.  CB#[phone].  Thank you.

## 2017-04-04 ENCOUNTER — Telehealth: Payer: Self-pay | Admitting: Rheumatology

## 2017-04-04 NOTE — Telephone Encounter (Signed)
Labs received from PCP. Drawn on 03/23/17. Urine shown UTI- make sure treat by PCP. CBC/ CMP WNL.  Attempted to contact the patient and left message for patient to call the office.

## 2017-04-04 NOTE — Telephone Encounter (Signed)
Patient called again today wanting to know if we received her lab results from [facility].  CB#[phone].  Thank you.

## 2017-04-04 NOTE — Telephone Encounter (Signed)
See previous phone note.  

## 2017-04-08 NOTE — Telephone Encounter (Signed)
Patient states she was treated by PCP for UTI.

## 2017-04-10 ENCOUNTER — Ambulatory Visit
Admission: RE | Admit: 2017-04-10 | Discharge: 2017-04-10 | Disposition: A | Discharge status: Home / Self Care | Payer: Medicare Other | Source: Ambulatory Visit | Attending: Internal Medicine | Admitting: Internal Medicine

## 2017-04-10 ENCOUNTER — Other Ambulatory Visit: Payer: Self-pay | Admitting: Internal Medicine

## 2017-04-10 DIAGNOSIS — R059 Cough, unspecified: Secondary | ICD-10-CM

## 2017-04-10 DIAGNOSIS — R05 Cough: Secondary | ICD-10-CM

## 2017-04-10 DIAGNOSIS — J40 Bronchitis, not specified as acute or chronic: Secondary | ICD-10-CM

## 2017-05-04 ENCOUNTER — Telehealth: Payer: Self-pay

## 2017-05-04 NOTE — Telephone Encounter (Signed)
Called and left a message with new appt due to dr Alvy Bimler out of office  Oris Staffieri

## 2017-07-04 ENCOUNTER — Other Ambulatory Visit (HOSPITAL_BASED_OUTPATIENT_CLINIC_OR_DEPARTMENT_OTHER): Payer: Medicare Other

## 2017-07-04 DIAGNOSIS — D472 Monoclonal gammopathy: Secondary | ICD-10-CM

## 2017-07-04 LAB — COMPREHENSIVE METABOLIC PANEL
ALK PHOS: 90 U/L (ref 40–150)
ALT: 12 U/L (ref 0–55)
ANION GAP: 8 meq/L (ref 3–11)
AST: 17 U/L (ref 5–34)
Albumin: 3.4 g/dL — ABNORMAL LOW (ref 3.5–5.0)
BUN: 15.8 mg/dL (ref 7.0–26.0)
CO2: 26 mEq/L (ref 22–29)
CREATININE: 1 mg/dL (ref 0.6–1.1)
Calcium: 9.5 mg/dL (ref 8.4–10.4)
Chloride: 105 mEq/L (ref 98–109)
EGFR: 66 mL/min/{1.73_m2} — ABNORMAL LOW (ref >90)
Glucose: 82 mg/dl (ref 70–140)
POTASSIUM: 3.7 meq/L (ref 3.5–5.1)
Sodium: 139 mEq/L (ref 136–145)
Total Bilirubin: 0.56 mg/dL (ref 0.20–1.20)
Total Protein: 7.6 g/dL (ref 6.4–8.3)

## 2017-07-04 LAB — CBC WITH DIFFERENTIAL/PLATELET
BASO%: 0.8 % (ref 0.0–2.0)
Basophils Absolute: 0 10*3/uL (ref 0.0–0.1)
EOS ABS: 0.4 10*3/uL (ref 0.0–0.5)
EOS%: 8.9 % — ABNORMAL HIGH (ref 0.0–7.0)
HCT: 36.8 % (ref 34.8–46.6)
HEMOGLOBIN: 11.8 g/dL (ref 11.6–15.9)
LYMPH%: 24.4 % (ref 14.0–49.7)
MCH: 25 pg — ABNORMAL LOW (ref 25.1–34.0)
MCHC: 31.9 g/dL (ref 31.5–36.0)
MCV: 78.2 fL — AB (ref 79.5–101.0)
MONO#: 0.3 10*3/uL (ref 0.1–0.9)
MONO%: 6.4 % (ref 0.0–14.0)
NEUT%: 59.5 % (ref 38.4–76.8)
NEUTROS ABS: 2.9 10*3/uL (ref 1.5–6.5)
Platelets: 231 10*3/uL (ref 145–400)
RBC: 4.71 10*6/uL (ref 3.70–5.45)
RDW: 15.6 % — AB (ref 11.2–14.5)
WBC: 4.8 10*3/uL (ref 3.9–10.3)
lymph#: 1.2 10*3/uL (ref 0.9–3.3)

## 2017-07-07 ENCOUNTER — Other Ambulatory Visit: Payer: Self-pay

## 2017-07-07 ENCOUNTER — Other Ambulatory Visit: Payer: Self-pay | Admitting: Rheumatology

## 2017-07-07 DIAGNOSIS — Z79899 Other long term (current) drug therapy: Secondary | ICD-10-CM

## 2017-07-07 DIAGNOSIS — M359 Systemic involvement of connective tissue, unspecified: Secondary | ICD-10-CM

## 2017-07-07 LAB — COMPLETE METABOLIC PANEL WITH GFR
AG Ratio: 1.3 (calc) (ref 1.0–2.5)
ALT: 11 U/L (ref 6–29)
AST: 15 U/L (ref 10–35)
Albumin: 3.8 g/dL (ref 3.6–5.1)
Alkaline phosphatase (APISO): 78 U/L (ref 33–130)
BUN/Creatinine Ratio: 19 (calc) (ref 6–22)
BUN: 20 mg/dL (ref 7–25)
CALCIUM: 9.1 mg/dL (ref 8.6–10.4)
CO2: 27 mmol/L (ref 20–32)
CREATININE: 1.05 mg/dL — AB (ref 0.60–0.93)
Chloride: 106 mmol/L (ref 98–110)
GFR, EST NON AFRICAN AMERICAN: 53 mL/min/{1.73_m2} — AB (ref >=60)
GFR, Est African American: 62 mL/min/{1.73_m2} (ref >=60)
Globulin: 3 g/dL (calc) (ref 1.9–3.7)
Glucose, Bld: 91 mg/dL (ref 65–99)
POTASSIUM: 3.7 mmol/L (ref 3.5–5.3)
Sodium: 140 mmol/L (ref 135–146)
Total Bilirubin: 0.5 mg/dL (ref 0.2–1.2)
Total Protein: 6.8 g/dL (ref 6.1–8.1)

## 2017-07-07 LAB — CBC WITH DIFFERENTIAL/PLATELET
Basophils Absolute: 39 cells/uL (ref 0–200)
Basophils Relative: 0.7 %
EOS PCT: 7.5 %
Eosinophils Absolute: 420 cells/uL (ref 15–500)
HCT: 36.9 % (ref 35.0–45.0)
Hemoglobin: 11.5 g/dL — ABNORMAL LOW (ref 11.7–15.5)
LYMPHS ABS: 1238 {cells}/uL (ref 850–3900)
MCH: 24.6 pg — ABNORMAL LOW (ref 27.0–33.0)
MCHC: 31.2 g/dL — ABNORMAL LOW (ref 32.0–36.0)
MCV: 78.8 fL — ABNORMAL LOW (ref 80.0–100.0)
MONOS PCT: 7.5 %
MPV: 12.3 fL (ref 7.5–12.5)
NEUTROS PCT: 62.2 %
Neutro Abs: 3483 cells/uL (ref 1500–7800)
PLATELETS: 291 10*3/uL (ref 140–400)
RBC: 4.68 10*6/uL (ref 3.80–5.10)
RDW: 13.8 % (ref 11.0–15.0)
TOTAL LYMPHOCYTE: 22.1 %
WBC mixed population: 420 cells/uL (ref 200–950)
WBC: 5.6 10*3/uL (ref 3.8–10.8)

## 2017-07-07 LAB — KAPPA/LAMBDA LIGHT CHAINS
IG KAPPA FREE LIGHT CHAIN: 59.8 mg/L — AB (ref 3.3–19.4)
Ig Lambda Free Light Chain: 19.8 mg/L (ref 5.7–26.3)
KAPPA/LAMBDA FLC RATIO: 3.02 — AB (ref 0.26–1.65)

## 2017-07-07 NOTE — Progress Notes (Unsigned)
rate, ANA with titer, ENA, C3-C4, CBC with differential and CMP with GFR and urinalysis per Mr Carlyon Shadow notes

## 2017-07-08 ENCOUNTER — Ambulatory Visit: Payer: Medicare Other | Admitting: Rheumatology

## 2017-07-08 LAB — MULTIPLE MYELOMA PANEL, SERUM
ALBUMIN SERPL ELPH-MCNC: 3.2 g/dL (ref 2.9–4.4)
ALPHA 1: 0.3 g/dL (ref 0.0–0.4)
Albumin/Glob SerPl: 1 (ref 0.7–1.7)
Alpha2 Glob SerPl Elph-Mcnc: 0.8 g/dL (ref 0.4–1.0)
B-GLOBULIN SERPL ELPH-MCNC: 1 g/dL (ref 0.7–1.3)
GAMMA GLOB SERPL ELPH-MCNC: 1.4 g/dL (ref 0.4–1.8)
GLOBULIN, TOTAL: 3.5 g/dL (ref 2.2–3.9)
IgA, Qn, Serum: 237 mg/dL (ref 64–422)
IgM, Qn, Serum: 17 mg/dL — ABNORMAL LOW (ref 26–217)
M PROTEIN SERPL ELPH-MCNC: 0.4 g/dL — AB
TOTAL PROTEIN: 6.7 g/dL (ref 6.0–8.5)

## 2017-07-08 LAB — URINALYSIS, ROUTINE W REFLEX MICROSCOPIC
BILIRUBIN URINE: NEGATIVE
GLUCOSE, UA: NEGATIVE
Hgb urine dipstick: NEGATIVE
Hyaline Cast: NONE SEEN /LPF
Ketones, ur: NEGATIVE
Nitrite: POSITIVE — AB
Protein, ur: NEGATIVE
RBC / HPF: NONE SEEN /HPF (ref 0–2)
SPECIFIC GRAVITY, URINE: 1.023 (ref 1.001–1.03)
SQUAMOUS EPITHELIAL / LPF: NONE SEEN /HPF (ref <=5)
pH: <=5 (ref 5.0–8.0)

## 2017-07-08 LAB — SJOGRENS SYNDROME-B EXTRACTABLE NUCLEAR ANTIBODY: SSB (La) (ENA) Antibody, IgG: <1 AI

## 2017-07-08 LAB — SJOGRENS SYNDROME-A EXTRACTABLE NUCLEAR ANTIBODY: SSA (Ro) (ENA) Antibody, IgG: <1 AI

## 2017-07-08 LAB — C3 AND C4
C3 COMPLEMENT: 145 mg/dL (ref 83–193)
C4 Complement: 29 mg/dL (ref 15–57)

## 2017-07-08 LAB — RNP ANTIBODY: RIBONUCLEIC PROTEIN(ENA) ANTIBODY, IGG: NEGATIVE AI

## 2017-07-08 LAB — ANTI-SMITH ANTIBODY: ENA SM AB SER-ACNC: NEGATIVE AI

## 2017-07-08 LAB — ANTI-NUCLEAR AB-TITER (ANA TITER): ANA Titer 1: >=1:1280 {titer} — AB

## 2017-07-08 LAB — ANA: Anti Nuclear Antibody(ANA): POSITIVE — AB

## 2017-07-08 LAB — ANTI-DNA ANTIBODY, DOUBLE-STRANDED: ds DNA Ab: <1 IU/mL

## 2017-07-08 LAB — ANTI-SCLERODERMA ANTIBODY: Scleroderma (Scl-70) (ENA) Antibody, IgG: <1 AI

## 2017-07-08 NOTE — Progress Notes (Signed)
Most of her labs are pending at this time. Her UA indicates urinary tract infection. Please advise patient to go to PCP or urgent care for the treatment.

## 2017-07-09 ENCOUNTER — Encounter: Payer: Self-pay | Admitting: Rheumatology

## 2017-07-09 ENCOUNTER — Ambulatory Visit (INDEPENDENT_AMBULATORY_CARE_PROVIDER_SITE_OTHER): Payer: Medicare Other | Admitting: Rheumatology

## 2017-07-09 VITALS — BP 130/81 | HR 108 | Ht 61.0 in | Wt 196.0 lb

## 2017-07-09 DIAGNOSIS — Z79899 Other long term (current) drug therapy: Secondary | ICD-10-CM

## 2017-07-09 DIAGNOSIS — E559 Vitamin D deficiency, unspecified: Secondary | ICD-10-CM | POA: Diagnosis not present

## 2017-07-09 DIAGNOSIS — D8989 Other specified disorders involving the immune mechanism, not elsewhere classified: Secondary | ICD-10-CM

## 2017-07-09 DIAGNOSIS — M359 Systemic involvement of connective tissue, unspecified: Secondary | ICD-10-CM

## 2017-07-09 DIAGNOSIS — R768 Other specified abnormal immunological findings in serum: Secondary | ICD-10-CM | POA: Diagnosis not present

## 2017-07-09 NOTE — Progress Notes (Signed)
Office Visit Note  Patient: Kimberly Vazquez             Date of Birth: 25-Jun-1946           MRN: [id number]             PCP: Andi Devon, MD Referring: Andi Devon, MD Visit Date: 07/09/2017 Occupation: @GUAROCC @    Subjective:  Follow-up (HERE FOR FLARE UP AND SKIN PROBLEMS FROM LUPUS )  History of Present Illness: Kimberly Vazquez is a 71 y.o. female  Who was last seen in our office on 03/18/2017 for autoimmune disease with positive ANA, inflammatory arthritis based on ultrasound completed 06/19/2016 (see SRS), high risk prescription (Plaquenil 200 mg twice a day Monday through Friday)[ we had considered adding methotrexate to the patient's treatment regimen since she was having flares of left third finger swelling. However, that resolved and we held off on adding methotrexate for the moment.]  Note: She is due for autoimmune workup today: We will draw the following labs: Sedimentation rate, ANA with titer, ENA, C3-C4, CBC with differential, CMP with GFR, urinalysis via lab .  Note: Patient's last negative Plaquenil eye exam was done 08/02/2016 at Dr. 08/04/2016 office. She is due for repeat Plaquenil eye exam approximately October 2018.   Activities of Daily Living:  Patient reports morning stiffness for 15 minutes.   Patient Denies nocturnal pain.  Difficulty dressing/grooming: Denies Difficulty climbing stairs: Denies Difficulty getting out of chair: Denies Difficulty using hands for taps, buttons, cutlery, and/or writing: Denies   Review of Systems  Constitutional: Negative for fatigue.  HENT: Negative for mouth sores and mouth dryness.   Eyes: Negative for dryness.  Respiratory: Negative for shortness of breath.   Gastrointestinal: Negative for constipation and diarrhea.  Musculoskeletal: Negative for myalgias and myalgias.  Skin: Negative for sensitivity to sunlight.  Psychiatric/Behavioral: Negative for decreased concentration and sleep disturbance.     PMFS History:  Patient Active Problem List   Diagnosis Date Noted  . Noncompliance with medication regimen 03/07/2017  . Rheumatoid factor positive 03/07/2017  . Primary insomnia 03/07/2017  . Primary osteoarthritis of both hands 03/07/2017  . Primary osteoarthritis of both knees 03/07/2017  . History of recurrent UTIs 03/07/2017  . Vitamin D deficiency 03/07/2017  . High risk medication use 08/29/2016  . Preventive measure 07/07/2015  . Autoimmune disease (HCC) 06/20/2014  . 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 CKD (chronic kidney disease), stage III 06/20/2014    Past Medical History:  Diagnosis Date  . Allergic rhinitis   . Anemia   . Arthritis   . Bladder disorder    "lazy" , caths twice a day  . Fatty liver   . GERD (gastroesophageal reflux disease)   . Hypertension   . Seasonal allergies   . Systemic lupus erythematosus (HCC)   . UTI (urinary tract infection)   . Vitamin D deficiency     Family History  Problem Relation Age of Onset  . Cancer Brother        unknown  . Kidney disease Son   . Diabetes Brother   . Heart disease Brother   . Colon cancer Neg Hx    Past Surgical History:  Procedure Laterality Date  . ABDOMINAL HYSTERECTOMY    . ROTATOR CUFF REPAIR Right 2012  . TONSILLECTOMY  1965   Social History   Social History Narrative  . No narrative on file     Objective: Vital Signs: BP 130/81 (BP Location: Right  Arm, Patient Position: Sitting, Cuff Size: Normal)   Pulse (!) 108   Ht '5\' 1"'$  (1.549 m)   Wt 196 lb (88.9 kg)   BMI 37.03 kg/m    Physical Exam  Constitutional: She is oriented to person, place, and time. She appears well-developed and well-nourished.  HENT:  Head: Normocephalic and atraumatic.  Eyes: Pupils are equal, round, and reactive to light. EOM are normal.  Cardiovascular: Normal rate, regular rhythm and normal heart sounds.  Exam reveals no gallop and no friction rub.   No murmur  heard. Pulmonary/Chest: Effort normal and breath sounds normal. She has no wheezes. She has no rales.  Abdominal: Soft. Bowel sounds are normal. She exhibits no distension. There is no tenderness. There is no guarding. No hernia.  Musculoskeletal: Normal range of motion. She exhibits no edema, tenderness or deformity.  Lymphadenopathy:    She has no cervical adenopathy.  Neurological: She is alert and oriented to person, place, and time. Coordination normal.  Skin: Skin is warm and dry. Capillary refill takes less than 2 seconds. No rash noted.  Psychiatric: She has a normal mood and affect. Her behavior is normal.  Nursing note and vitals reviewed.    Musculoskeletal Exam:  Full range of motion of all joints Grip strength is equal and strong bilaterally Fibromyalgia tender points are all absent  CDAI Exam: No CDAI exam completed.    Investigation: No additional findings. Lab on 07/07/2017  Component Date Value Ref Range Status  . Anit Nuclear Antibody(ANA) 07/07/2017 POSITIVE* NEGATIVE Final   Comment: ANA IFA is a first line screen for detecting the presence of up to approximately 150 autoantibodies in various autoimmune diseases. A positive ANA IFA result is suggestive of autoimmune disease and reflexes to titer and pattern. Further laboratory testing may be considered if clinically indicated. . Visit Physician FAQs for interpretation of all antibodies in the [facility], prevalence, and association with diseases at http://education.[facility].com/ NAT/[email or web address] .   Marland Kitchen ds DNA Ab 07/07/2017 <1  IU/mL Final   Comment:                            IU/mL       Interpretation                            < or = 4    Negative                            5-9         Indeterminate                            > or = 10   Positive .   Marland Kitchen Scleroderma (Scl-70) (ENA) Antibod* 07/07/2017 <1.0 NEG  <1.0 NEG AI Final  . SSA (Ro) (ENA) Antibody, IgG 07/07/2017 <1.0 NEG  <1.0 NEG AI Final  .  SSB (La) (ENA) Antibody, IgG 07/07/2017 <1.0 NEG  <1.0 NEG AI Final  . ENA SM Ab Ser-aCnc 07/07/2017 <1.0 NEG  <1.0 NEG AI Final  . Ribonucleic Protein(ENA) Antibody,* 07/07/2017 <1.0 NEG  <1.0 NEG AI Final  . C3 Complement 07/07/2017 145  83 - 193 mg/dL Final  . C4 Complement 07/07/2017 29  15 - 57 mg/dL Final  . Color, Urine 07/07/2017 DARK YELLOW  YELLOW Final  . APPearance  07/07/2017 CLOUDY* CLEAR Final  . Specific Gravity, Urine 07/07/2017 1.023  1.001 - 1.03 Final  . pH 07/07/2017 < OR = 5.0  5.0 - 8.0 Final  . Glucose, UA 07/07/2017 NEGATIVE  NEGATIVE Final  . Bilirubin Urine 07/07/2017 NEGATIVE  NEGATIVE Final  . Ketones, ur 07/07/2017 NEGATIVE  NEGATIVE Final  . Hgb urine dipstick 07/07/2017 NEGATIVE  NEGATIVE Final  . Protein, ur 07/07/2017 NEGATIVE  NEGATIVE Final  . Nitrite 07/07/2017 POSITIVE* NEGATIVE Final  . Leukocytes, UA 07/07/2017 2+* NEGATIVE Final  . WBC, UA 07/07/2017 20-40* 0 - 5 /HPF Final  . RBC / HPF 07/07/2017 NONE SEEN  0 - 2 /HPF Final  . Squamous Epithelial / LPF 07/07/2017 NONE SEEN  < OR = 5 /HPF Final  . Bacteria, UA 07/07/2017 MANY* NONE SEEN /HPF Final  . Hyaline Cast 07/07/2017 NONE SEEN  NONE SEEN /LPF Final  . ANA Pattern 1 07/07/2017 HOMOGENEOUS*  Final   Comment: Homogeneous pattern is associated with systemic lupus erythematosus (SLE), drug induced lupus and juvenile idiopathic arthritis.   . ANA Titer 1 07/07/2017 > OR = 1:1280* titer Final   Comment:                 Reference Range                 <1:40        Negative                 1:40-1:80    Low Antibody Level                 >1:80        Elevated Antibody Level .   Orders Only on 07/07/2017  Component Date Value Ref Range Status  . WBC 07/07/2017 5.6  3.8 - 10.8 Thousand/uL Final  . RBC 07/07/2017 4.68  3.80 - 5.10 Million/uL Final  . Hemoglobin 07/07/2017 11.5* 11.7 - 15.5 g/dL Final  . HCT 17/92/1783 36.9  35.0 - 45.0 % Final  . MCV 07/07/2017 78.8* 80.0 - 100.0 fL Final  .  MCH 07/07/2017 24.6* 27.0 - 33.0 pg Final  . MCHC 07/07/2017 31.2* 32.0 - 36.0 g/dL Final  . RDW 75/42/3702 13.8  11.0 - 15.0 % Final  . Platelets 07/07/2017 291  140 - 400 Thousand/uL Final  . MPV 07/07/2017 12.3  7.5 - 12.5 fL Final  . Neutro Abs 07/07/2017 3483  1,500 - 7,800 cells/uL Final  . Lymphs Abs 07/07/2017 1238  850 - 3,900 cells/uL Final  . WBC mixed population 07/07/2017 420  200 - 950 cells/uL Final  . Eosinophils Absolute 07/07/2017 420  15 - 500 cells/uL Final  . Basophils Absolute 07/07/2017 39  0 - 200 cells/uL Final  . Neutrophils Relative % 07/07/2017 62.2  % Final  . Total Lymphocyte 07/07/2017 22.1  % Final  . Monocytes Relative 07/07/2017 7.5  % Final  . Eosinophils Relative 07/07/2017 7.5  % Final  . Basophils Relative 07/07/2017 0.7  % Final  . Glucose, Bld 07/07/2017 91  65 - 99 mg/dL Final   Comment: .            Fasting reference interval .   . BUN 07/07/2017 20  7 - 25 mg/dL Final  . Creat 30/17/2091 1.05* 0.60 - 0.93 mg/dL Final   Comment: For patients >4 years of age, the reference limit for Creatinine is approximately 13% higher for people identified as African-American. .   . GFR, Est  Non African American 07/07/2017 53* > OR = 60 mL/min/1.18m Final  . GFR, Est African American 07/07/2017 62  > OR = 60 mL/min/1.733mFinal  . BUN/Creatinine Ratio 07/07/2017 19  6 - 22 (calc) Final  . Sodium 07/07/2017 140  135 - 146 mmol/L Final  . Potassium 07/07/2017 3.7  3.5 - 5.3 mmol/L Final  . Chloride 07/07/2017 106  98 - 110 mmol/L Final  . CO2 07/07/2017 27  20 - 32 mmol/L Final  . Calcium 07/07/2017 9.1  8.6 - 10.4 mg/dL Final  . Total Protein 07/07/2017 6.8  6.1 - 8.1 g/dL Final  . Albumin 07/07/2017 3.8  3.6 - 5.1 g/dL Final  . Globulin 07/07/2017 3.0  1.9 - 3.7 g/dL (calc) Final  . AG Ratio 07/07/2017 1.3  1.0 - 2.5 (calc) Final  . Total Bilirubin 07/07/2017 0.5  0.2 - 1.2 mg/dL Final  . Alkaline phosphatase (APISO) 07/07/2017 78  33 - 130 U/L  Final  . AST 07/07/2017 15  10 - 35 U/L Final  . ALT 07/07/2017 11  6 - 29 U/L Final  Appointment on 07/04/2017  Component Date Value Ref Range Status  . Sodium 07/04/2017 139  136 - 145 mEq/L Final  . Potassium 07/04/2017 3.7  3.5 - 5.1 mEq/L Final  . Chloride 07/04/2017 105  98 - 109 mEq/L Final  . CO2 07/04/2017 26  22 - 29 mEq/L Final  . Glucose 07/04/2017 82  70 - 140 mg/dl Final   Glucose reference range is for nonfasting patients. Fasting glucose reference range is 70- 100.  . Marland KitchenUN 07/04/2017 15.8  7.0 - 26.0 mg/dL Final  . Creatinine 07/04/2017 1.0  0.6 - 1.1 mg/dL Final  . Total Bilirubin 07/04/2017 0.56  0.20 - 1.20 mg/dL Final  . Alkaline Phosphatase 07/04/2017 90  40 - 150 U/L Final  . AST 07/04/2017 17  5 - 34 U/L Final  . ALT 07/04/2017 12  0 - 55 U/L Final  . Total Protein 07/04/2017 7.6  6.4 - 8.3 g/dL Final  . Albumin 07/04/2017 3.4* 3.5 - 5.0 g/dL Final  . Calcium 07/04/2017 9.5  8.4 - 10.4 mg/dL Final  . Anion Gap 07/04/2017 8  3 - 11 mEq/L Final  . EGFR 07/04/2017 66* >90 ml/min/1.73 m2 Final   eGFR is calculated using the CKD-EPI Creatinine Equation (2009)  . WBC 07/04/2017 4.8  3.9 - 10.3 10e3/uL Final  . NEUT# 07/04/2017 2.9  1.5 - 6.5 10e3/uL Final  . HGB 07/04/2017 11.8  11.6 - 15.9 g/dL Final  . HCT 07/04/2017 36.8  34.8 - 46.6 % Final  . Platelets 07/04/2017 231  145 - 400 10e3/uL Final  . MCV 07/04/2017 78.2* 79.5 - 101.0 fL Final  . MCH 07/04/2017 25.0* 25.1 - 34.0 pg Final  . MCHC 07/04/2017 31.9  31.5 - 36.0 g/dL Final  . RBC 07/04/2017 4.71  3.70 - 5.45 10e6/uL Final  . RDW 07/04/2017 15.6* 11.2 - 14.5 % Final  . lymph# 07/04/2017 1.2  0.9 - 3.3 10e3/uL Final  . MONO# 07/04/2017 0.3  0.1 - 0.9 10e3/uL Final  . Eosinophils Absolute 07/04/2017 0.4  0.0 - 0.5 10e3/uL Final  . Basophils Absolute 07/04/2017 0.0  0.0 - 0.1 10e3/uL Final  . NEUT% 07/04/2017 59.5  38.4 - 76.8 % Final  . LYMPH% 07/04/2017 24.4  14.0 - 49.7 % Final  . MONO% 07/04/2017 6.4   0.0 - 14.0 % Final  . EOS% 07/04/2017 8.9* 0.0 - 7.0 % Final  .  BASO% 07/04/2017 0.8  0.0 - 2.0 % Final  . Ig Kappa Free Light Chain 07/04/2017 59.8* 3.3 - 19.4 mg/L Final  . Ig Lambda Free Light Chain 07/04/2017 19.8  5.7 - 26.3 mg/L Final  . Kappa/Lambda FluidC Ratio 07/04/2017 3.02* 0.26 - 1.65 Final  . IgG, Qn, Serum 07/04/2017 1,671* 700 - 1600 mg/dL Final  . IgA, Qn, Serum 07/04/2017 237  64 - 422 mg/dL Final  . IgM, Qn, Serum 07/04/2017 17* 26 - 217 mg/dL Final   Result confirmed on concentration.  . Total Protein 07/04/2017 6.7  6.0 - 8.5 g/dL Final  . Albumin SerPl Elph-Mcnc 07/04/2017 3.2  2.9 - 4.4 g/dL Final  . Alpha 1 07/04/2017 0.3  0.0 - 0.4 g/dL Final  . Alpha2 Glob SerPl Elph-Mcnc 07/04/2017 0.8  0.4 - 1.0 g/dL Final  . B-Globulin SerPl Elph-Mcnc 07/04/2017 1.0  0.7 - 1.3 g/dL Final  . Gamma Glob SerPl Elph-Mcnc 07/04/2017 1.4  0.4 - 1.8 g/dL Final  . M Protein SerPl Elph-Mcnc 07/04/2017 0.4* Not Observed g/dL Final  . Globulin, Total 07/04/2017 3.5  2.2 - 3.9 g/dL Final  . Albumin/Glob SerPl 07/04/2017 1.0  0.7 - 1.7 Final  . IFE 1 07/04/2017 Comment   Final   Comment: Immunofixation shows IgG monoclonal protein with kappa light chain specificity.   . Please Note 07/04/2017 Comment   Final   Comment: Protein electrophoresis scan will follow via computer, mail, or courier delivery.      Imaging: No results found.  Speciality Comments: No specialty comments available.    Procedures:  No procedures performed Allergies: Patient has no known allergies.   Assessment / Plan:     Visit Diagnoses: Autoimmune disease ([place]) - Positive ANA, positive rheumatoid factor  Rheumatoid factor positive  High risk medication use  Vitamin D deficiency    #1: Autoimmune disease; positive ANA, positive rheumatoid factor  #2: High risk prescription Plaquenil 200 mg twice a day Monday through Friday Note: Patient's last negative Plaquenil eye exam was done 08/02/2016 at  Dr. Velvet Bathe office. She is due for repeat Plaquenil eye exam approximately October 2018. (At one time, patient was having swelling of her left third digit and we considered adding methotrexate. The swelling had resolved and we held off adding methotrexate for the moment.)  #3: She is due for autoimmune workup today: We HAD following labs DONE 07-07-17: Sedimentation rate, ANA with titer, ENA, C3-C4, CBC with differential, CMP with GFR, urinalysis via lab .  #4: Inflammatory arthritis. No synovitis, no joint pain, swelling, stiffness Adequate response with Plaquenil.  #5: I gave her Plaquenil eye exam form for Plaquenil eye exam due October 2018  #6: I gave her copy of her urine analysis done recently which shows many bacteria, nitrite, 20-30 WBCs. I've advised her to see her PCP, Dr. Karlton Lemon, for evaluation and treatment if Dr. Karlton Lemon finds appropriate.   Orders: No orders of the defined types were placed in this encounter.  No orders of the defined types were placed in this encounter.   Face-to-face time spent with patient was 30 minutes. 50% of time was spent in counseling and coordination of care.  Follow-Up Instructions: No Follow-up on file.   Eliezer Lofts, PA-C I examined and evaluated the patient with Eliezer Lofts PA. The plan of care was discussed as noted above.  Bo Merino, MD Note - This record has been created using Editor, commissioning.  Chart creation errors have been sought, but may not always  have  been located. Such creation errors do not reflect on  the standard of medical care.

## 2017-07-11 ENCOUNTER — Ambulatory Visit: Payer: Medicare Other | Admitting: Hematology and Oncology

## 2017-07-14 ENCOUNTER — Ambulatory Visit (HOSPITAL_BASED_OUTPATIENT_CLINIC_OR_DEPARTMENT_OTHER): Payer: Medicare Other | Admitting: Hematology and Oncology

## 2017-07-14 ENCOUNTER — Telehealth: Payer: Self-pay | Admitting: Hematology and Oncology

## 2017-07-14 VITALS — BP 144/79 | HR 100 | Temp 97.6°F | Resp 18 | Ht 61.0 in | Wt 195.5 lb

## 2017-07-14 DIAGNOSIS — D472 Monoclonal gammopathy: Secondary | ICD-10-CM | POA: Diagnosis not present

## 2017-07-14 DIAGNOSIS — Z23 Encounter for immunization: Secondary | ICD-10-CM

## 2017-07-14 DIAGNOSIS — M329 Systemic lupus erythematosus, unspecified: Secondary | ICD-10-CM

## 2017-07-14 DIAGNOSIS — M359 Systemic involvement of connective tissue, unspecified: Secondary | ICD-10-CM

## 2017-07-14 MED ORDER — INFLUENZA VAC SPLIT QUAD 0.5 ML IM SUSY
0.5000 mL | PREFILLED_SYRINGE | Freq: Once | INTRAMUSCULAR | Status: AC
  Administered 2017-07-14: 0.5 mL via INTRAMUSCULAR
  Filled 2017-07-14: qty 0.5

## 2017-07-14 NOTE — Telephone Encounter (Signed)
Scheduled appt per 9/24 los - Gave patient AVS and calender per los.  

## 2017-07-15 ENCOUNTER — Encounter: Payer: Self-pay | Admitting: Hematology and Oncology

## 2017-07-15 NOTE — Assessment & Plan Note (Signed)
I do not believe this is the cause of her joint pain. Her recent myeloma panel did not show evidence of disease progression We discussed the natural history of MGUS. I will see her on a yearly basis with repeat blood work. We discussed the importance of vaccination We discussed the importance of preventive care and reviewed the vaccination programs. She does not have any prior allergic reactions to influenza vaccination. She agrees to proceed with influenza vaccination today and we will administer it today at the clinic.

## 2017-07-15 NOTE — Progress Notes (Signed)
[facility] OFFICE PROGRESS NOTE  Patient Care Team: Willey Blade, MD as PCP - General (Internal Medicine) Bo Merino, MD as Consulting Physician (Rheumatology) Heath Lark, MD as Consulting Physician (Hematology and Oncology)  SUMMARY OF ONCOLOGIC HISTORY:  Kimberly Vazquez is here because of recently discovered IgG kappa MGUS. This patient has diffuse joint pain and was recently diagnosed with systemic lupus. She was on hydroxychloroquine a week ago. The patient is a high-dose vitamin D supplement for vitamin D deficiency. She has joint pain throughout her body, worse in the lumbar region, shoulders, hips and her right leg. The patient also had bladder prolapse with neurologic bladder and she self catheterize twice a day for many years.  INTERVAL HISTORY: Please see below for problem oriented charting. She returns for further follow-up She denies worsening joint pain She was found to have possible recurrent urinary tract infection She continues to require intermittent prednisone dosing over the past year  REVIEW OF SYSTEMS:   Constitutional: Denies fevers, chills or abnormal weight loss Eyes: Denies blurriness of vision Ears, nose, mouth, throat, and face: Denies mucositis or sore throat Respiratory: Denies cough, dyspnea or wheezes Cardiovascular: Denies palpitation, chest discomfort or lower extremity swelling Gastrointestinal:  Denies nausea, heartburn or change in bowel habits Skin: Denies abnormal skin rashes Lymphatics: Denies new lymphadenopathy or easy bruising Neurological:Denies numbness, tingling or new weaknesses Behavioral/Psych: Mood is stable, no new changes  All other systems were reviewed with the patient and are negative.  I have reviewed the past medical history, past surgical history, social history and family history with the patient and they are unchanged from previous note.  ALLERGIES:  has No Known Allergies.  MEDICATIONS:   Current Outpatient Prescriptions  Medication Sig Dispense Refill  . chlorhexidine (PERIDEX) 0.12 % solution RINSE WITH 1 CAPFUL ONCE DAILY AND EXPECTORATE. DO NOT RINSE WITH WATER  2  . cholecalciferol (VITAMIN D) 1000 UNITS tablet Take 2,000 Units by mouth daily.    . hydroxychloroquine (PLAQUENIL) 200 MG tablet 200 mg  Bid, Monday through Friday; none on Saturday or sunday 135 tablet 1  . losartan (COZAAR) 100 MG tablet Take 100 mg by mouth daily.     . Multiple Vitamins-Minerals (ONE-A-DAY ENERGY PO) Take by mouth.    . nitrofurantoin (MACRODANTIN) 50 MG capsule Take 50 mg by mouth once.    . nortriptyline (PAMELOR) 25 MG capsule Take 1 capsule (25 mg total) by mouth at bedtime. 90 capsule 3  . Omega-3 Fatty Acids (FISH OIL) 500 MG CAPS Take 500 mg by mouth daily.    Marland Kitchen triamterene-hydrochlorothiazide (MAXZIDE-25) 37.5-25 MG per tablet Take 1 tablet by mouth daily.     No current facility-administered medications for this visit.     PHYSICAL EXAMINATION: ECOG PERFORMANCE STATUS: 1 - Symptomatic but completely ambulatory  Vitals:   07/14/17 1217  BP: (!) 144/79  Pulse: 100  Resp: 18  Temp: 97.6 F (36.4 C)  SpO2: 100%   Filed Weights   07/14/17 1217  Weight: 195 lb 8 oz (88.7 kg)    GENERAL:alert, no distress and comfortable SKIN: skin color, texture, turgor are normal, no rashes or significant lesions EYES: normal, Conjunctiva are pink and non-injected, sclera clear OROPHARYNX:no exudate, no erythema and lips, buccal mucosa, and tongue normal  NECK: supple, thyroid normal size, non-tender, without nodularity LYMPH:  no palpable lymphadenopathy in the cervical, axillary or inguinal LUNGS: clear to auscultation and percussion with normal breathing effort HEART: regular rate & rhythm and no murmurs  and no lower extremity edema ABDOMEN:abdomen soft, non-tender and normal bowel sounds Musculoskeletal:no cyanosis of digits and no clubbing  NEURO: alert & oriented x 3 with  fluent speech, no focal motor/sensory deficits  LABORATORY DATA:  I have reviewed the data as listed    Component Value Date/Time   NA 140 07/07/2017 1519   NA 139 07/04/2017 1120   K 3.7 07/07/2017 1519   K 3.7 07/04/2017 1120   CL 106 07/07/2017 1519   CO2 27 07/07/2017 1519   CO2 26 07/04/2017 1120   GLUCOSE 91 07/07/2017 1519   GLUCOSE 82 07/04/2017 1120   BUN 20 07/07/2017 1519   BUN 15.8 07/04/2017 1120   CREATININE 1.05 (H) 07/07/2017 1519   CREATININE 1.0 07/04/2017 1120   CALCIUM 9.1 07/07/2017 1519   CALCIUM 9.5 07/04/2017 1120   PROT 6.8 07/07/2017 1519   PROT 7.6 07/04/2017 1120   PROT 6.7 07/04/2017 1120   ALBUMIN 3.4 (L) 07/04/2017 1120   AST 15 07/07/2017 1519   AST 17 07/04/2017 1120   ALT 11 07/07/2017 1519   ALT 12 07/04/2017 1120   ALKPHOS 90 07/04/2017 1120   BILITOT 0.5 07/07/2017 1519   BILITOT 0.56 07/04/2017 1120   GFRNONAA 53 (L) 07/07/2017 1519   GFRAA 62 07/07/2017 1519    No results found for: SPEP, UPEP  Lab Results  Component Value Date   WBC 5.6 07/07/2017   NEUTROABS 3,483 07/07/2017   HGB 11.5 (L) 07/07/2017   HCT 36.9 07/07/2017   MCV 78.8 (L) 07/07/2017   PLT 291 07/07/2017      Chemistry      Component Value Date/Time   NA 140 07/07/2017 1519   NA 139 07/04/2017 1120   K 3.7 07/07/2017 1519   K 3.7 07/04/2017 1120   CL 106 07/07/2017 1519   CO2 27 07/07/2017 1519   CO2 26 07/04/2017 1120   BUN 20 07/07/2017 1519   BUN 15.8 07/04/2017 1120   CREATININE 1.05 (H) 07/07/2017 1519   CREATININE 1.0 07/04/2017 1120      Component Value Date/Time   CALCIUM 9.1 07/07/2017 1519   CALCIUM 9.5 07/04/2017 1120   ALKPHOS 90 07/04/2017 1120   AST 15 07/07/2017 1519   AST 17 07/04/2017 1120   ALT 11 07/07/2017 1519   ALT 12 07/04/2017 1120   BILITOT 0.5 07/07/2017 1519   BILITOT 0.56 07/04/2017 1120      ASSESSMENT & PLAN:  MGUS (monoclonal gammopathy of unknown significance) I do not believe this is the cause of her  joint pain. Her recent myeloma panel did not show evidence of disease progression We discussed the natural history of MGUS. I will see her on a yearly basis with repeat blood work. We discussed the importance of vaccination We discussed the importance of preventive care and reviewed the vaccination programs. She does not have any prior allergic reactions to influenza vaccination. She agrees to proceed with influenza vaccination today and we will administer it today at the clinic.   Autoimmune disease ([place]) She has chronic systemic lupus on hydroxychloroquine and intermittent doses of prednisone. I would defer to her rheumatologist for further management   No orders of the defined types were placed in this encounter.  All questions were answered. The patient knows to call the clinic with any problems, questions or concerns. No barriers to learning was detected. I spent 10 minutes counseling the patient face to face. The total time spent in the appointment was 15 minutes  and more than 50% was on counseling and review of test results     Heath Lark, MD 07/15/2017 4:29 PM

## 2017-07-15 NOTE — Assessment & Plan Note (Signed)
She has chronic systemic lupus on hydroxychloroquine and intermittent doses of prednisone. I would defer to her rheumatologist for further management

## 2017-07-21 ENCOUNTER — Ambulatory Visit: Payer: Medicare Other | Admitting: Rheumatology

## 2017-08-04 ENCOUNTER — Other Ambulatory Visit: Payer: Self-pay | Admitting: Rheumatology

## 2017-08-04 NOTE — Telephone Encounter (Signed)
ok 

## 2017-08-04 NOTE — Telephone Encounter (Signed)
Last visit: 07/09/17 Next visit: 12/11/17 Labs: 07/07/17 Creat 1.05 previously 1.0 Eye exam: 08/02/16   Ok to refill 30 day supply of Plaquenil?

## 2017-08-20 ENCOUNTER — Other Ambulatory Visit: Payer: Self-pay | Admitting: Rheumatology

## 2017-08-27 ENCOUNTER — Telehealth: Payer: Self-pay

## 2017-08-27 NOTE — Telephone Encounter (Signed)
ok 

## 2017-08-27 NOTE — Telephone Encounter (Signed)
Last visit: 07/09/17 Next visit: 12/11/17 Labs: 07/07/17 Creat 1.05 previously 1.0 Eye exam: 08/02/16   Ok to refill 30 day supply of Plaquenil?

## 2017-08-28 ENCOUNTER — Other Ambulatory Visit: Payer: Self-pay | Admitting: Rheumatology

## 2017-08-28 MED ORDER — HYDROXYCHLOROQUINE SULFATE 200 MG PO TABS: ORAL_TABLET | ORAL | 0 refills | Status: DC

## 2017-08-28 NOTE — Telephone Encounter (Signed)
Patient needs refill on Plaquenil sent to [facility] on [address]

## 2017-08-28 NOTE — Telephone Encounter (Signed)
Okay per Dr. Deveshwar  

## 2017-08-28 NOTE — Telephone Encounter (Signed)
Done

## 2017-08-28 NOTE — Telephone Encounter (Signed)
Last visit: 07/09/17 Next visit: 12/11/17 Labs: 07/07/17 Creat 1.05 previously 1.0 Eye exam: 08/02/16   Ok to refill 30 day supply of Plaquenil?

## 2017-09-17 ENCOUNTER — Other Ambulatory Visit: Payer: Self-pay | Admitting: Rheumatology

## 2017-09-17 NOTE — Telephone Encounter (Signed)
Last visit: 07/09/17 Next visit: 12/11/17 Labs: 07/07/17 Creat 1.05 previously 1.0 Eye exam: 08/02/16   Left message to advise patient we need update PLQ Eye exam. PLQ was refilled 2 weeks ago. To early to refill

## 2017-09-20 ENCOUNTER — Other Ambulatory Visit: Payer: Self-pay | Admitting: Rheumatology

## 2017-09-22 NOTE — Telephone Encounter (Signed)
Last visit: 07/09/17 Next visit: 12/11/17 Labs: 07/07/17 Creat 1.05 previously 1.0 Eye exam: 08/28/17  Okay to refill per Dr. Estanislado Pandy

## 2017-10-27 ENCOUNTER — Other Ambulatory Visit: Payer: Self-pay | Admitting: Internal Medicine

## 2017-10-27 DIAGNOSIS — Z1231 Encounter for screening mammogram for malignant neoplasm of breast: Secondary | ICD-10-CM

## 2017-10-30 ENCOUNTER — Other Ambulatory Visit (INDEPENDENT_AMBULATORY_CARE_PROVIDER_SITE_OTHER): Payer: Self-pay | Admitting: Specialist

## 2017-10-30 NOTE — Telephone Encounter (Signed)
Nortriptyline refill request--- Was last seen on 01/02/2015---please advise

## 2017-11-19 ENCOUNTER — Ambulatory Visit
Admission: RE | Admit: 2017-11-19 | Discharge: 2017-11-19 | Disposition: A | Discharge status: Home / Self Care | Payer: Medicare Other | Source: Ambulatory Visit | Attending: Internal Medicine | Admitting: Internal Medicine

## 2017-11-19 DIAGNOSIS — Z1231 Encounter for screening mammogram for malignant neoplasm of breast: Secondary | ICD-10-CM

## 2017-11-28 NOTE — Progress Notes (Signed)
Office Visit Note  Patient: Kimberly Vazquez             Date of Birth: 1945/11/16           MRN: [id number]             PCP: Willey Blade, MD Referring: Willey Blade, MD Visit Date: 12/11/2017 Occupation: @GUAROCC @    Subjective:  Left shoulder pain    History of Present Illness: Kimberly Vazquez is a 72 y.o. female with history of autoimmune disease, DDD, and osteoarthritis.  Patient states she has been tolerating PLQ 200 mg BID M-F.  She had her eye exam performed in November 2018.  She needs a refill of PLQ.  She states she is having pain in her left shoulder.  She states her ROM is good and she is not having pain at night.  She denies any joint swelling.  She states her hands have not been causing pain, stiffness, or swelling.  She states that she has been having muscle soreness in her bilateral lower extremities.  Her neck and lower back have been doing ok.  She states that her left knee causes occasional discomfort.  She denies any oral or nasal ulcers.  Denies any lymphadenopathy.  She states she has a rash on bilateral elbows.  She uses triamcinolone and tea tree oil on her elbows, which helps.   Activities of Daily Living:  Patient reports morning stiffness for 30  minutes.   Patient Reports nocturnal pain.  Difficulty dressing/grooming: Denies Difficulty climbing stairs: Denies Difficulty getting out of chair: Denies Difficulty using hands for taps, buttons, cutlery, and/or writing: Denies   Review of Systems  Constitutional: Positive for fatigue. Negative for weakness.  HENT: Positive for mouth sores and mouth dryness. Negative for nose dryness.   Eyes: Negative for pain, redness, visual disturbance and dryness.  Respiratory: Negative for cough, hemoptysis, shortness of breath and difficulty breathing.   Cardiovascular: Negative for chest pain, palpitations, hypertension, irregular heartbeat and swelling in legs/feet.  Gastrointestinal: Negative for blood in  stool, constipation and diarrhea.  Endocrine: Negative for increased urination.  Genitourinary: Negative for painful urination.  Musculoskeletal: Positive for arthralgias, joint pain, morning stiffness and muscle tenderness. Negative for joint swelling, myalgias, muscle weakness and myalgias.  Skin: Positive for rash, hair loss and sensitivity to sunlight. Negative for color change, pallor, nodules/bumps, redness, skin tightness and ulcers.  Allergic/Immunologic: Negative for susceptible to infections.  Neurological: Negative for dizziness, numbness and headaches.  Hematological: Negative for swollen glands.  Psychiatric/Behavioral: Negative for depressed mood and sleep disturbance. The patient is not nervous/anxious.     PMFS History:  Patient Active Problem List   Diagnosis Date Noted  . Noncompliance with medication regimen 03/07/2017  . Rheumatoid factor positive 03/07/2017  . Primary insomnia 03/07/2017  . Primary osteoarthritis of both hands 03/07/2017  . Primary osteoarthritis of both knees 03/07/2017  . History of recurrent UTIs 03/07/2017  . Vitamin D deficiency 03/07/2017  . High risk medication use 08/29/2016  . Preventive measure 07/07/2015  . Autoimmune disease ([place]) 06/20/2014  . 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 CKD (chronic kidney disease), stage III ([place]) 06/20/2014    Past Medical History:  Diagnosis Date  . Allergic rhinitis   . Anemia   . Arthritis   . Bladder disorder    "lazy" , caths twice a day  . Fatty liver   . GERD (gastroesophageal reflux disease)   . Hypertension   . Seasonal allergies   .  Systemic lupus erythematosus ([place])   . UTI (urinary tract infection)   . Vitamin D deficiency     Family History  Problem Relation Age of Onset  . Cancer Brother        unknown  . Kidney disease Son   . Diabetes Brother   . Heart disease Brother   . Colon cancer Neg Hx    Past Surgical History:  Procedure Laterality  Date  . ABDOMINAL HYSTERECTOMY    . ROTATOR CUFF REPAIR Right 2012  . TONSILLECTOMY  1965   Social History   Social History Narrative  . Not on file     Objective: Vital Signs: BP 128/81   Pulse 94   Resp 18   Ht 5' 1.5" (1.562 m)   Wt 195 lb (88.5 kg)   BMI 36.25 kg/m    Physical Exam  Constitutional: She is oriented to person, place, and time. She appears well-developed and well-nourished.  HENT:  Head: Normocephalic and atraumatic.  Eyes: Conjunctivae and EOM are normal.  Neck: Normal range of motion.  Cardiovascular: Normal rate, regular rhythm, normal heart sounds and intact distal pulses.  Pulmonary/Chest: Effort normal and breath sounds normal.  Abdominal: Soft. Bowel sounds are normal.  Lymphadenopathy:    She has no cervical adenopathy.  Neurological: She is alert and oriented to person, place, and time.  Skin: Skin is warm and dry. Capillary refill takes less than 2 seconds.  Hyperpigmentation on bilateral extensor surfaces of elbows.   Hyperpigmentation in several fingernail.  Left great toenail is black.   Psychiatric: She has a normal mood and affect. Her behavior is normal.  Nursing note and vitals reviewed.    Musculoskeletal Exam: C-spine very limited ROM.  Thoracic and lumbar spine good ROM.  Shoulder joints, elbow joints, wrist joints, MCPs, PIPs, and DIPs good ROM with no synovitis.  She has mild DIP synovial thickening. She has bilateral shoulder crepitus. Hip joints, knee joints, ankle joints, MTPs, PIPs, and DIPs good ROM with no synovitis.  No warmth or effusion of knees.  No knee crepitus.  No midline spinal tenderness or SI joint tenderness.  No trochanteric bursa tenderness.    CDAI Exam: No CDAI exam completed.    Investigation: No additional findings.PLQ eye exam: 08/28/2017 CBC Latest Ref Rng & Units 07/07/2017 07/04/2017 08/29/2016  WBC 3.8 - 10.8 Thousand/uL 5.6 4.8 4.5  Hemoglobin 11.7 - 15.5 g/dL 11.5(L) 11.8 11.4(L)  Hematocrit 35.0 -  45.0 % 36.9 36.8 37.3  Platelets 140 - 400 Thousand/uL 291 231 258   CMP Latest Ref Rng & Units 07/07/2017 07/04/2017 07/04/2017  Glucose 65 - 99 mg/dL 91 82 -  BUN 7 - 25 mg/dL 20 15.8 -  Creatinine 0.60 - 0.93 mg/dL 1.05(H) 1.0 -  Sodium 135 - 146 mmol/L 140 139 -  Potassium 3.5 - 5.3 mmol/L 3.7 3.7 -  Chloride 98 - 110 mmol/L 106 - -  CO2 20 - 32 mmol/L 27 26 -  Calcium 8.6 - 10.4 mg/dL 9.1 9.5 -  Total Protein 6.1 - 8.1 g/dL 6.8 7.6 6.7  Total Bilirubin 0.2 - 1.2 mg/dL 0.5 0.56 -  Alkaline Phos 40 - 150 U/L - 90 -  AST 10 - 35 U/L 15 17 -  ALT 6 - 29 U/L 11 12 -    Imaging: Mm Screening Breast Tomo Bilateral  Result Date: 11/19/2017 CLINICAL DATA:  Screening. EXAM: 2D DIGITAL SCREENING BILATERAL MAMMOGRAM WITH 3D TOMO WITH CAD COMPARISON:  Previous exam(s). ACR  Breast Density Category b: There are scattered areas of fibroglandular density. FINDINGS: There are no findings suspicious for malignancy. Images were processed with CAD. IMPRESSION: No mammographic evidence of malignancy. A result letter of this screening mammogram will be mailed directly to the patient. RECOMMENDATION: Screening mammogram in one year. (Code:SM-B-01Y) BI-RADS CATEGORY  2: Benign. Electronically Signed   By: Evangeline Dakin M.D.   On: 11/19/2017 15:59    Speciality Comments: PLQ eye exam: 08/28/2017 Normal. Dr. Clent Jacks. Follow up in 1 year.    Procedures:  No procedures performed Allergies: Patient has no known allergies.   Assessment / Plan:     Visit Diagnoses: Autoimmune disease ([place]) - +ANA: She is clinically doing well on Plaquenil 200 mg BID M-F. A refill of PLQ was sent to the pharmacy. She has no oral or nasal ulcers on exam.  No cervical lymphadenopathy.  No synovitis on exam. She has hyperpigmentation on extensor surfaces of bilateral elbows.  She uses triamcinolone and tea tree oil on her elbows when the rash flares.  She is going to get her PCP to make a referral to a dermatologist.  She has  hyperpigmentation in several fingernails and black discoloration of her left great toenail. - Plan: Urinalysis, Routine w reflex microscopic, ANA, Anti-DNA antibody, double-stranded, C3 and C4, Sedimentation rate, VITAMIN D 25 Hydroxy (Vit-D Deficiency, Fractures)  High risk medication use - PLQ eye exam: 08/28/2017.  CBC and CMP were ordered today to monitor for drug toxicity.  She will have routine CBC and CMP drawn every 5 months. - Plan: CBC with Differential/Platelet, COMPLETE METABOLIC PANEL WITH GFR  Rheumatoid factor positive :  -CCP, no synovitis on exam.   Primary insomnia: Chronic  Primary osteoarthritis of both hands: She has no tenderness or synovitis on exam.  She has mild DIP synovial thickening.    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  She has no warmth or effusion on exam.    Chronic left shoulder pain: She has bilateral shoulder crepitus.  She has discomfort with ROM of her left shoulder.  She has full ROM.  She was given a handout of shoulder exercises she can perform at home.    DDD (degenerative disc disease), cervical: very limited ROM.  She has no discomfort at this time.   DDD (degenerative disc disease), lumbar: She used to see Dr. Louanne Skye.  Her lower back has been doing ok.  She has no midline spinal tenderness on exam.    History of vitamin D deficiency:Vitamin D level was checked today.   Other medical conditions are listed as follows:   History of hypertension  History of recurrent UTIs  CKD (chronic kidney disease), stage III ([facility])  MGUS (monoclonal gammopathy of unknown significance)     Orders: Orders Placed This Encounter  Procedures  . CBC with Differential/Platelet  . COMPLETE METABOLIC PANEL WITH GFR  . Urinalysis, Routine w reflex microscopic  . ANA  . Anti-DNA antibody, double-stranded  . C3 and C4  . Sedimentation rate  . VITAMIN D 25 Hydroxy (Vit-D Deficiency, Fractures)    Meds ordered this encounter  Medications  . hydroxychloroquine (PLAQUENIL) 200 MG tablet    Sig: Take one tablet by mouth twice a day Monday through Friday.    Dispense:  120 tablet    Refill:  0    Face-to-face time spent with patient was 30 minutes. Greater than 50% of time was spent in counseling and  coordination of care.  Follow-Up Instructions: Return in about 5 months (around 05/10/2018) for Autoimmune disease, Osteoarthritis.   Ofilia Neas, PA-C  Note - This record has been created using Dragon software.  Chart creation errors have been sought, but may not always  have been located. Such creation errors do not reflect on  the standard of medical care.

## 2017-12-11 ENCOUNTER — Ambulatory Visit: Payer: Medicare Other | Admitting: Rheumatology

## 2017-12-11 ENCOUNTER — Encounter: Payer: Self-pay | Admitting: Rheumatology

## 2017-12-11 VITALS — BP 128/81 | HR 94 | Resp 18 | Ht 61.5 in | Wt 195.0 lb

## 2017-12-11 DIAGNOSIS — R768 Other specified abnormal immunological findings in serum: Secondary | ICD-10-CM | POA: Diagnosis not present

## 2017-12-11 DIAGNOSIS — Z8639 Personal history of other endocrine, nutritional and metabolic disease: Secondary | ICD-10-CM

## 2017-12-11 DIAGNOSIS — N183 Chronic kidney disease, stage 3 unspecified: Secondary | ICD-10-CM

## 2017-12-11 DIAGNOSIS — R7689 Other specified abnormal immunological findings in serum: Secondary | ICD-10-CM

## 2017-12-11 DIAGNOSIS — M19042 Primary osteoarthritis, left hand: Secondary | ICD-10-CM

## 2017-12-11 DIAGNOSIS — F5101 Primary insomnia: Secondary | ICD-10-CM

## 2017-12-11 DIAGNOSIS — Z8679 Personal history of other diseases of the circulatory system: Secondary | ICD-10-CM

## 2017-12-11 DIAGNOSIS — D8989 Other specified disorders involving the immune mechanism, not elsewhere classified: Secondary | ICD-10-CM

## 2017-12-11 DIAGNOSIS — M19041 Primary osteoarthritis, right hand: Secondary | ICD-10-CM | POA: Diagnosis not present

## 2017-12-11 DIAGNOSIS — M51369 Other intervertebral disc degeneration, lumbar region without mention of lumbar back pain or lower extremity pain: Secondary | ICD-10-CM

## 2017-12-11 DIAGNOSIS — Z87440 Personal history of urinary (tract) infections: Secondary | ICD-10-CM | POA: Diagnosis not present

## 2017-12-11 DIAGNOSIS — Z79899 Other long term (current) drug therapy: Secondary | ICD-10-CM

## 2017-12-11 DIAGNOSIS — M170 Bilateral primary osteoarthritis of knee: Secondary | ICD-10-CM | POA: Diagnosis not present

## 2017-12-11 DIAGNOSIS — M5136 Other intervertebral disc degeneration, lumbar region: Secondary | ICD-10-CM | POA: Diagnosis not present

## 2017-12-11 DIAGNOSIS — M5030 Other cervical disc degeneration, unspecified cervical region: Secondary | ICD-10-CM | POA: Diagnosis not present

## 2017-12-11 DIAGNOSIS — M359 Systemic involvement of connective tissue, unspecified: Secondary | ICD-10-CM

## 2017-12-11 DIAGNOSIS — G8929 Other chronic pain: Secondary | ICD-10-CM

## 2017-12-11 DIAGNOSIS — M25512 Pain in left shoulder: Secondary | ICD-10-CM

## 2017-12-11 DIAGNOSIS — D472 Monoclonal gammopathy: Secondary | ICD-10-CM

## 2017-12-11 MED ORDER — HYDROXYCHLOROQUINE SULFATE 200 MG PO TABS: ORAL_TABLET | ORAL | 0 refills | Status: DC

## 2017-12-11 NOTE — Patient Instructions (Signed)
Shoulder Exercises Ask your health care provider which exercises are safe for you. Do exercises exactly as told by your health care provider and adjust them as directed. It is normal to feel mild stretching, pulling, tightness, or discomfort as you do these exercises, but you should stop right away if you feel sudden pain or your pain gets worse.Do not begin these exercises until told by your health care provider. RANGE OF MOTION EXERCISES These exercises warm up your muscles and joints and improve the movement and flexibility of your shoulder. These exercises also help to relieve pain, numbness, and tingling. These exercises involve stretching your injured shoulder directly. Exercise A: Pendulum  1. Stand near a wall or a surface that you can hold onto for balance. 2. Bend at the waist and let your left / right arm hang straight down. Use your other arm to support you. Keep your back straight and do not lock your knees. 3. Relax your left / right arm and shoulder muscles, and move your hips and your trunk so your left / right arm swings freely. Your arm should swing because of the motion of your body, not because you are using your arm or shoulder muscles. 4. Keep moving your body so your arm swings in the following directions, as told by your health care provider: ? Side to side. ? Forward and backward. ? In clockwise and counterclockwise circles. 5. Continue each motion for __________ seconds, or for as long as told by your health care provider. 6. Slowly return to the starting position. Repeat __________ times. Complete this exercise __________ times a day. Exercise B:Flexion, Standing  1. Stand and hold a broomstick, a cane, or a similar object. Place your hands a little more than shoulder-width apart on the object. Your left / right hand should be palm-up, and your other hand should be palm-down. 2. Keep your elbow straight and keep your shoulder muscles relaxed. Push the stick down with  your healthy arm to raise your left / right arm in front of your body, and then over your head until you feel a stretch in your shoulder. ? Avoid shrugging your shoulder while you raise your arm. Keep your shoulder blade tucked down toward the middle of your back. 3. Hold for __________ seconds. 4. Slowly return to the starting position. Repeat __________ times. Complete this exercise __________ times a day. Exercise C: Abduction, Standing 1. Stand and hold a broomstick, a cane, or a similar object. Place your hands a little more than shoulder-width apart on the object. Your left / right hand should be palm-up, and your other hand should be palm-down. 2. While keeping your elbow straight and your shoulder muscles relaxed, push the stick across your body toward your left / right side. Raise your left / right arm to the side of your body and then over your head until you feel a stretch in your shoulder. ? Do not raise your arm above shoulder height, unless your health care provider tells you to do that. ? Avoid shrugging your shoulder while you raise your arm. Keep your shoulder blade tucked down toward the middle of your back. 3. Hold for __________ seconds. 4. Slowly return to the starting position. Repeat __________ times. Complete this exercise __________ times a day. Exercise D:Internal Rotation  1. Place your left / right hand behind your back, palm-up. 2. Use your other hand to dangle an exercise band, a towel, or a similar object over your shoulder. Grasp the band with   your left / right hand so you are holding onto both ends. 3. Gently pull up on the band until you feel a stretch in the front of your left / right shoulder. ? Avoid shrugging your shoulder while you raise your arm. Keep your shoulder blade tucked down toward the middle of your back. 4. Hold for __________ seconds. 5. Release the stretch by letting go of the band and lowering your hands. Repeat __________ times. Complete  this exercise __________ times a day. STRETCHING EXERCISES These exercises warm up your muscles and joints and improve the movement and flexibility of your shoulder. These exercises also help to relieve pain, numbness, and tingling. These exercises are done using your healthy shoulder to help stretch the muscles of your injured shoulder. Exercise E: Corner Stretch (External Rotation and Abduction)  1. Stand in a doorway with one of your feet slightly in front of the other. This is called a staggered stance. If you cannot reach your forearms to the door frame, stand facing a corner of a room. 2. Choose one of the following positions as told by your health care provider: ? Place your hands and forearms on the door frame above your head. ? Place your hands and forearms on the door frame at the height of your head. ? Place your hands on the door frame at the height of your elbows. 3. Slowly move your weight onto your front foot until you feel a stretch across your chest and in the front of your shoulders. Keep your head and chest upright and keep your abdominal muscles tight. 4. Hold for __________ seconds. 5. To release the stretch, shift your weight to your back foot. Repeat __________ times. Complete this stretch __________ times a day. Exercise F:Extension, Standing 1. Stand and hold a broomstick, a cane, or a similar object behind your back. ? Your hands should be a little wider than shoulder-width apart. ? Your palms should face away from your back. 2. Keeping your elbows straight and keeping your shoulder muscles relaxed, move the stick away from your body until you feel a stretch in your shoulder. ? Avoid shrugging your shoulders while you move the stick. Keep your shoulder blade tucked down toward the middle of your back. 3. Hold for __________ seconds. 4. Slowly return to the starting position. Repeat __________ times. Complete this exercise __________ times a day. STRENGTHENING  EXERCISES These exercises build strength and endurance in your shoulder. Endurance is the ability to use your muscles for a long time, even after they get tired. Exercise G:External Rotation  1. Sit in a stable chair without armrests. 2. Secure an exercise band at elbow height on your left / right side. 3. Place a soft object, such as a folded towel or a small pillow, between your left / right upper arm and your body to move your elbow a few inches away (about 10 cm) from your side. 4. Hold the end of the band so it is tight and there is no slack. 5. Keeping your elbow pressed against the soft object, move your left / right forearm out, away from your abdomen. Keep your body steady so only your forearm moves. 6. Hold for __________ seconds. 7. Slowly return to the starting position. Repeat __________ times. Complete this exercise __________ times a day. Exercise H:Shoulder Abduction  1. Sit in a stable chair without armrests, or stand. 2. Hold a __________ weight in your left / right hand, or hold an exercise band with both hands.   3. Start with your arms straight down and your left / right palm facing in, toward your body. 4. Slowly lift your left / right hand out to your side. Do not lift your hand above shoulder height unless your health care provider tells you that this is safe. ? Keep your arms straight. ? Avoid shrugging your shoulder while you do this movement. Keep your shoulder blade tucked down toward the middle of your back. 5. Hold for __________ seconds. 6. Slowly lower your arm, and return to the starting position. Repeat __________ times. Complete this exercise __________ times a day. Exercise I:Shoulder Extension 1. Sit in a stable chair without armrests, or stand. 2. Secure an exercise band to a stable object in front of you where it is at shoulder height. 3. Hold one end of the exercise band in each hand. Your palms should face each other. 4. Straighten your elbows and  lift your hands up to shoulder height. 5. Step back, away from the secured end of the exercise band, until the band is tight and there is no slack. 6. Squeeze your shoulder blades together as you pull your hands down to the sides of your thighs. Stop when your hands are straight down by your sides. Do not let your hands go behind your body. 7. Hold for __________ seconds. 8. Slowly return to the starting position. Repeat __________ times. Complete this exercise __________ times a day. Exercise J:Standing Shoulder Row 1. Sit in a stable chair without armrests, or stand. 2. Secure an exercise band to a stable object in front of you so it is at waist height. 3. Hold one end of the exercise band in each hand. Your palms should be in a thumbs-up position. 4. Bend each of your elbows to an "L" shape (about 90 degrees) and keep your upper arms at your sides. 5. Step back until the band is tight and there is no slack. 6. Slowly pull your elbows back behind you. 7. Hold for __________ seconds. 8. Slowly return to the starting position. Repeat __________ times. Complete this exercise __________ times a day. Exercise K:Shoulder Press-Ups  1. Sit in a stable chair that has armrests. Sit upright, with your feet flat on the floor. 2. Put your hands on the armrests so your elbows are bent and your fingers are pointing forward. Your hands should be about even with the sides of your body. 3. Push down on the armrests and use your arms to lift yourself off of the chair. Straighten your elbows and lift yourself up as much as you comfortably can. ? Move your shoulder blades down, and avoid letting your shoulders move up toward your ears. ? Keep your feet on the ground. As you get stronger, your feet should support less of your body weight as you lift yourself up. 4. Hold for __________ seconds. 5. Slowly lower yourself back into the chair. Repeat __________ times. Complete this exercise __________ times a  day. Exercise L: Wall Push-Ups  1. Stand so you are facing a stable wall. Your feet should be about one arm-length away from the wall. 2. Lean forward and place your palms on the wall at shoulder height. 3. Keep your feet flat on the floor as you bend your elbows and lean forward toward the wall. 4. Hold for __________ seconds. 5. Straighten your elbows to push yourself back to the starting position. Repeat __________ times. Complete this exercise __________ times a day. This information is not intended to replace advice   given to you by your health care provider. Make sure you discuss any questions you have with your health care provider. Document Released: 08/21/2005 Document Revised: 07/01/2016 Document Reviewed: 06/18/2015 Elsevier Interactive Patient Education  2018 [employer]  

## 2017-12-17 LAB — SEDIMENTATION RATE: Sed Rate: 28 mm/h (ref 0–30)

## 2017-12-17 LAB — COMPLETE METABOLIC PANEL WITH GFR
AG RATIO: 1.3 (calc) (ref 1.0–2.5)
ALBUMIN MSPROF: 3.9 g/dL (ref 3.6–5.1)
ALT: 13 U/L (ref 6–29)
AST: 16 U/L (ref 10–35)
Alkaline phosphatase (APISO): 73 U/L (ref 33–130)
BUN: 14 mg/dL (ref 7–25)
CALCIUM: 9.2 mg/dL (ref 8.6–10.4)
CO2: 27 mmol/L (ref 20–32)
Chloride: 105 mmol/L (ref 98–110)
Creat: 0.9 mg/dL (ref 0.60–0.93)
GFR, EST NON AFRICAN AMERICAN: 64 mL/min/{1.73_m2} (ref >=60)
GFR, Est African American: 75 mL/min/{1.73_m2} (ref >=60)
GLOBULIN: 3 g/dL (ref 1.9–3.7)
Glucose, Bld: 82 mg/dL (ref 65–99)
POTASSIUM: 4 mmol/L (ref 3.5–5.3)
SODIUM: 139 mmol/L (ref 135–146)
TOTAL PROTEIN: 6.9 g/dL (ref 6.1–8.1)
Total Bilirubin: 0.7 mg/dL (ref 0.2–1.2)

## 2017-12-17 LAB — URINALYSIS, ROUTINE W REFLEX MICROSCOPIC
Bilirubin Urine: NEGATIVE
Glucose, UA: NEGATIVE
Hgb urine dipstick: NEGATIVE
Hyaline Cast: NONE SEEN /LPF
KETONES UR: NEGATIVE
Nitrite: POSITIVE — AB
Protein, ur: NEGATIVE
RBC / HPF: NONE SEEN /HPF (ref 0–2)
SPECIFIC GRAVITY, URINE: 1.007 (ref 1.001–1.03)
pH: 6.5 (ref 5.0–8.0)

## 2017-12-17 LAB — CBC WITH DIFFERENTIAL/PLATELET
BASOS PCT: 1.2 %
Basophils Absolute: 50 cells/uL (ref 0–200)
EOS ABS: 437 {cells}/uL (ref 15–500)
Eosinophils Relative: 10.4 %
HEMATOCRIT: 38.5 % (ref 35.0–45.0)
Hemoglobin: 12.2 g/dL (ref 11.7–15.5)
LYMPHS ABS: 1277 {cells}/uL (ref 850–3900)
MCH: 24.6 pg — AB (ref 27.0–33.0)
MCHC: 31.7 g/dL — ABNORMAL LOW (ref 32.0–36.0)
MCV: 77.8 fL — AB (ref 80.0–100.0)
MPV: 12.1 fL (ref 7.5–12.5)
Monocytes Relative: 5.7 %
Neutro Abs: 2197 cells/uL (ref 1500–7800)
Neutrophils Relative %: 52.3 %
Platelets: 238 10*3/uL (ref 140–400)
RBC: 4.95 10*6/uL (ref 3.80–5.10)
RDW: 13.4 % (ref 11.0–15.0)
Total Lymphocyte: 30.4 %
WBC: 4.2 10*3/uL (ref 3.8–10.8)
WBCMIX: 239 {cells}/uL (ref 200–950)

## 2017-12-17 LAB — VITAMIN D 25 HYDROXY (VIT D DEFICIENCY, FRACTURES): VIT D 25 HYDROXY: 43 ng/mL (ref 30–100)

## 2017-12-17 LAB — ANA: ANA: POSITIVE — AB

## 2017-12-17 LAB — ANTI-NUCLEAR AB-TITER (ANA TITER): ANA Titer 1: 1:640 {titer} — ABNORMAL HIGH

## 2017-12-17 LAB — ANTI-DNA ANTIBODY, DOUBLE-STRANDED

## 2017-12-17 LAB — C3 AND C4
C3 Complement: 136 mg/dL (ref 83–193)
C4 Complement: 27 mg/dL (ref 15–57)

## 2017-12-18 NOTE — Progress Notes (Signed)
CBC stable.  ANA titer lower.  UA reveals findings consistent with a UTI.  Please ask patient if she is having increased urgency, frequency, or dysuria.   All other labs are WNL.

## 2017-12-24 ENCOUNTER — Telehealth: Payer: Self-pay | Admitting: Rheumatology

## 2017-12-24 NOTE — Telephone Encounter (Signed)
Attempted to contact the patient and left message for patient to call the office.  

## 2017-12-24 NOTE — Telephone Encounter (Signed)
Patient called with questions regarding the Shingles GRIX injection.

## 2017-12-25 ENCOUNTER — Telehealth: Payer: Self-pay | Admitting: Rheumatology

## 2017-12-25 NOTE — Telephone Encounter (Signed)
Attempted to contact the patient and left message for patient to call the office.  

## 2017-12-25 NOTE — Telephone Encounter (Signed)
Patient left a voicemail returning your call about the shingles shot.

## 2018-01-09 ENCOUNTER — Other Ambulatory Visit: Payer: Self-pay | Admitting: Physician Assistant

## 2018-01-09 NOTE — Telephone Encounter (Signed)
Last Visit: 12/11/17 Next Visit due July 2019. Message sent to the front to schedule patient.  Labs: 12/11/17 cbc stable/cmp wnl PLQ Eye Exam: 09/07/17 WNL  Okay to refill per Dr. Deveshwar  

## 2018-03-26 ENCOUNTER — Other Ambulatory Visit: Payer: Self-pay | Admitting: Rheumatology

## 2018-03-26 NOTE — Telephone Encounter (Signed)
Last Visit: 12/11/17 Next Visit due July 2019. Message sent to the front to schedule patient.  Labs: 12/11/17 cbc stable/cmp wnl PLQ Eye Exam: 09/07/17 WNL  Okay to refill per Dr. Deveshwar  

## 2018-04-09 ENCOUNTER — Other Ambulatory Visit: Payer: Self-pay | Admitting: Rheumatology

## 2018-04-09 NOTE — Telephone Encounter (Signed)
Last Visit: 12/11/17 Next Visit due July 2019. Message sent to the front to schedule patient.  Labs: 12/11/17 cbc stable/cmp wnl PLQ Eye Exam: 09/07/17 WNL  Okay to refill per Dr. Estanislado Pandy

## 2018-04-25 ENCOUNTER — Other Ambulatory Visit: Payer: Self-pay | Admitting: Rheumatology

## 2018-07-03 ENCOUNTER — Other Ambulatory Visit: Payer: Self-pay | Admitting: Hematology and Oncology

## 2018-07-03 DIAGNOSIS — D472 Monoclonal gammopathy: Secondary | ICD-10-CM

## 2018-07-06 ENCOUNTER — Inpatient Hospital Stay: Payer: Medicare Other | Attending: Hematology and Oncology

## 2018-07-06 DIAGNOSIS — D472 Monoclonal gammopathy: Secondary | ICD-10-CM | POA: Diagnosis not present

## 2018-07-06 DIAGNOSIS — E559 Vitamin D deficiency, unspecified: Secondary | ICD-10-CM | POA: Diagnosis not present

## 2018-07-06 DIAGNOSIS — R42 Dizziness and giddiness: Secondary | ICD-10-CM | POA: Insufficient documentation

## 2018-07-06 DIAGNOSIS — K644 Residual hemorrhoidal skin tags: Secondary | ICD-10-CM | POA: Diagnosis not present

## 2018-07-06 LAB — CBC WITH DIFFERENTIAL/PLATELET
BASOS ABS: 0 10*3/uL (ref 0.0–0.1)
Basophils Relative: 1 %
EOS ABS: 0.3 10*3/uL (ref 0.0–0.5)
Eosinophils Relative: 7 %
HEMATOCRIT: 36.2 % (ref 34.8–46.6)
HEMOGLOBIN: 11.5 g/dL — AB (ref 11.6–15.9)
Lymphocytes Relative: 27 %
Lymphs Abs: 1.3 10*3/uL (ref 0.9–3.3)
MCH: 25.7 pg (ref 25.1–34.0)
MCHC: 31.8 g/dL (ref 31.5–36.0)
MCV: 80.8 fL (ref 79.5–101.0)
Monocytes Absolute: 0.3 10*3/uL (ref 0.1–0.9)
Monocytes Relative: 6 %
NEUTROS ABS: 2.9 10*3/uL (ref 1.5–6.5)
Neutrophils Relative %: 59 %
Platelets: 227 10*3/uL (ref 145–400)
RBC: 4.48 MIL/uL (ref 3.70–5.45)
RDW: 15 % — ABNORMAL HIGH (ref 11.2–14.5)
WBC: 4.8 10*3/uL (ref 3.9–10.3)

## 2018-07-06 LAB — COMPREHENSIVE METABOLIC PANEL
ALBUMIN: 3.8 g/dL (ref 3.5–5.0)
ALK PHOS: 87 U/L (ref 38–126)
ALT: 11 U/L (ref 0–44)
ANION GAP: 11 (ref 5–15)
AST: 19 U/L (ref 15–41)
BILIRUBIN TOTAL: 0.9 mg/dL (ref 0.3–1.2)
BUN: 20 mg/dL (ref 8–23)
CALCIUM: 10 mg/dL (ref 8.9–10.3)
CO2: 24 mmol/L (ref 22–32)
Chloride: 103 mmol/L (ref 98–111)
Creatinine, Ser: 1.04 mg/dL — ABNORMAL HIGH (ref 0.44–1.00)
GFR calc Af Amer: >60 mL/min (ref >60)
GFR calc non Af Amer: 52 mL/min — ABNORMAL LOW (ref >60)
GLUCOSE: 77 mg/dL (ref 70–99)
Potassium: 3.9 mmol/L (ref 3.5–5.1)
SODIUM: 138 mmol/L (ref 135–145)
TOTAL PROTEIN: 7.7 g/dL (ref 6.5–8.1)

## 2018-07-07 LAB — MULTIPLE MYELOMA PANEL, SERUM
ALBUMIN SERPL ELPH-MCNC: 3.6 g/dL (ref 2.9–4.4)
ALPHA 1: 0.2 g/dL (ref 0.0–0.4)
ALPHA2 GLOB SERPL ELPH-MCNC: 0.8 g/dL (ref 0.4–1.0)
Albumin/Glob SerPl: 1.2 (ref 0.7–1.7)
B-GLOBULIN SERPL ELPH-MCNC: 0.9 g/dL (ref 0.7–1.3)
GAMMA GLOB SERPL ELPH-MCNC: 1.3 g/dL (ref 0.4–1.8)
Globulin, Total: 3.2 g/dL (ref 2.2–3.9)
IGG (IMMUNOGLOBIN G), SERUM: 1415 mg/dL (ref 700–1600)
IgA: 210 mg/dL (ref 64–422)
IgM (Immunoglobulin M), Srm: 13 mg/dL — ABNORMAL LOW (ref 26–217)
M PROTEIN SERPL ELPH-MCNC: 0.4 g/dL — AB
Total Protein ELP: 6.8 g/dL (ref 6.0–8.5)

## 2018-07-07 LAB — KAPPA/LAMBDA LIGHT CHAINS
KAPPA FREE LGHT CHN: 72.2 mg/L — AB (ref 3.3–19.4)
KAPPA, LAMDA LIGHT CHAIN RATIO: 3.99 — AB (ref 0.26–1.65)
Lambda free light chains: 18.1 mg/L (ref 5.7–26.3)

## 2018-07-07 LAB — BETA 2 MICROGLOBULIN, SERUM: BETA 2 MICROGLOBULIN: 2.3 mg/L (ref 0.6–2.4)

## 2018-07-13 ENCOUNTER — Telehealth: Payer: Self-pay | Admitting: Hematology and Oncology

## 2018-07-13 ENCOUNTER — Inpatient Hospital Stay (HOSPITAL_BASED_OUTPATIENT_CLINIC_OR_DEPARTMENT_OTHER): Payer: Medicare Other | Admitting: Hematology and Oncology

## 2018-07-13 DIAGNOSIS — D472 Monoclonal gammopathy: Secondary | ICD-10-CM

## 2018-07-13 DIAGNOSIS — K644 Residual hemorrhoidal skin tags: Secondary | ICD-10-CM

## 2018-07-13 DIAGNOSIS — R42 Dizziness and giddiness: Secondary | ICD-10-CM | POA: Diagnosis not present

## 2018-07-13 DIAGNOSIS — E559 Vitamin D deficiency, unspecified: Secondary | ICD-10-CM | POA: Diagnosis not present

## 2018-07-14 ENCOUNTER — Encounter: Payer: Self-pay | Admitting: Hematology and Oncology

## 2018-07-14 DIAGNOSIS — K649 Unspecified hemorrhoids: Secondary | ICD-10-CM | POA: Insufficient documentation

## 2018-07-14 DIAGNOSIS — R42 Dizziness and giddiness: Secondary | ICD-10-CM | POA: Insufficient documentation

## 2018-07-14 NOTE — Assessment & Plan Note (Signed)
She had recent intermittent hemorrhoidal bleeding Examination show mild residual skin tag We discussed local hygiene and dietary modification

## 2018-07-14 NOTE — Assessment & Plan Note (Signed)
Her recent myeloma panel did not show evidence of disease progression We discussed the natural history of MGUS. I will see her on a yearly basis with repeat blood work.

## 2018-07-14 NOTE — Assessment & Plan Note (Signed)
She has symptoms of dizziness Her blood pressure is satisfactory but it is not clear to me whether her blood pressure might be running low at home I recommend her to spread out her prescription antihypertensives to see if this might be alleviate some of her symptoms

## 2018-07-14 NOTE — Progress Notes (Signed)
[facility] OFFICE PROGRESS NOTE  Patient Care Team: Willey Blade, MD as PCP - General (Internal Medicine) Bo Merino, MD as Consulting Physician (Rheumatology) Heath Lark, MD as Consulting Physician (Hematology and Oncology)  ASSESSMENT & PLAN:  MGUS (monoclonal gammopathy of unknown significance) Her recent myeloma panel did not show evidence of disease progression We discussed the natural history of MGUS. I will see her on a yearly basis with repeat blood work.  Dizziness She has symptoms of dizziness Her blood pressure is satisfactory but it is not clear to me whether her blood pressure might be running low at home I recommend her to spread out her prescription antihypertensives to see if this might be alleviate some of her symptoms  Hemorrhoids She had recent intermittent hemorrhoidal bleeding Examination show mild residual skin tag We discussed local hygiene and dietary modification   No orders of the defined types were placed in this encounter.   INTERVAL HISTORY: Please see below for problem oriented charting. She returns for further follow-up for MGUS She complained of occasional dizziness No new bone pain Denies recent infection She complained of intermittent hemorrhoidal bleeding  SUMMARY OF ONCOLOGIC HISTORY:  Kimberly Vazquez is here because of recently discovered IgG kappa MGUS. This patient has diffuse joint pain and was recently diagnosed with systemic lupus. She was on hydroxychloroquine a week ago. The patient is a high-dose vitamin D supplement for vitamin D deficiency. She has joint pain throughout her body, worse in the lumbar region, shoulders, hips and her right leg. The patient also had bladder prolapse with neurologic bladder and she self catheterize twice a day for many years  REVIEW OF SYSTEMS:   Constitutional: Denies fevers, chills or abnormal weight loss Eyes: Denies blurriness of vision Ears, nose, mouth, throat, and  face: Denies mucositis or sore throat Respiratory: Denies cough, dyspnea or wheezes Cardiovascular: Denies palpitation, chest discomfort or lower extremity swelling Gastrointestinal:  Denies nausea, heartburn or change in bowel habits Skin: Denies abnormal skin rashes Lymphatics: Denies new lymphadenopathy or easy bruising Neurological:Denies numbness, tingling or new weaknesses Behavioral/Psych: Mood is stable, no new changes  All other systems were reviewed with the patient and are negative.  I have reviewed the past medical history, past surgical history, social history and family history with the patient and they are unchanged from previous note.  ALLERGIES:  has No Known Allergies.  MEDICATIONS:  Current Outpatient Medications  Medication Sig Dispense Refill  . valsartan (DIOVAN) 160 MG tablet Take 160 mg by mouth daily.    . chlorhexidine (PERIDEX) 0.12 % solution RINSE WITH 1 CAPFUL ONCE DAILY AND EXPECTORATE. DO NOT RINSE WITH WATER  2  . cholecalciferol (VITAMIN D) 1000 UNITS tablet Take 2,000 Units by mouth daily.    . hydroxychloroquine (PLAQUENIL) 200 MG tablet TAKE ONE TABLET BY MOUTH TWICE A DAY MONDAY THROUGH FRIDAY. 120 tablet 0  . hydroxychloroquine (PLAQUENIL) 200 MG tablet TAKE ONE TABLET BY MOUTH TWICE A DAY MONDAY THROUGH FRIDAY. 40 tablet 0  . Multiple Vitamins-Minerals (ONE-A-DAY ENERGY PO) Take by mouth.    . nitrofurantoin (MACRODANTIN) 50 MG capsule Take 50 mg by mouth once.    . nortriptyline (PAMELOR) 25 MG capsule TAKE 1 CAPSULE (25 MG TOTAL) BY MOUTH AT BEDTIME. 90 capsule 3  . Omega-3 Fatty Acids (FISH OIL) 500 MG CAPS Take 500 mg by mouth daily.    Marland Kitchen triamterene-hydrochlorothiazide (MAXZIDE-25) 37.5-25 MG per tablet Take 1 tablet by mouth daily.     No current facility-administered  medications for this visit.     PHYSICAL EXAMINATION: ECOG PERFORMANCE STATUS: 1 - Symptomatic but completely ambulatory  Vitals:   07/13/18 1148  BP: (!) 130/58  Pulse:  86  Resp: 18  Temp: 97.6 F (36.4 C)  SpO2: 100%   Filed Weights   07/13/18 1148  Weight: 194 lb 12.8 oz (88.4 kg)    GENERAL:alert, no distress and comfortable SKIN: skin color, texture, turgor are normal, no rashes or significant lesions EYES: normal, Conjunctiva are pink and non-injected, sclera clear OROPHARYNX:no exudate, no erythema and lips, buccal mucosa, and tongue normal  NECK: supple, thyroid normal size, non-tender, without nodularity LYMPH:  no palpable lymphadenopathy in the cervical, axillary or inguinal LUNGS: clear to auscultation and percussion with normal breathing effort HEART: regular rate & rhythm and no murmurs and no lower extremity edema ABDOMEN:abdomen soft, non-tender and normal bowel sounds Musculoskeletal:no cyanosis of digits and no clubbing  NEURO: alert & oriented x 3 with fluent speech, no focal motor/sensory deficits Examination of the rectal area reveals some skin tags but no signs of active bleeding  LABORATORY DATA:  I have reviewed the data as listed    Component Value Date/Time   NA 138 07/06/2018 1225   NA 139 07/04/2017 1120   K 3.9 07/06/2018 1225   K 3.7 07/04/2017 1120   CL 103 07/06/2018 1225   CO2 24 07/06/2018 1225   CO2 26 07/04/2017 1120   GLUCOSE 77 07/06/2018 1225   GLUCOSE 82 07/04/2017 1120   BUN 20 07/06/2018 1225   BUN 15.8 07/04/2017 1120   CREATININE 1.04 (H) 07/06/2018 1225   CREATININE 0.90 12/11/2017 1520   CREATININE 1.0 07/04/2017 1120   CALCIUM 10.0 07/06/2018 1225   CALCIUM 9.5 07/04/2017 1120   PROT 7.7 07/06/2018 1225   PROT 7.6 07/04/2017 1120   PROT 6.7 07/04/2017 1120   ALBUMIN 3.8 07/06/2018 1225   ALBUMIN 3.4 (L) 07/04/2017 1120   AST 19 07/06/2018 1225   AST 17 07/04/2017 1120   ALT 11 07/06/2018 1225   ALT 12 07/04/2017 1120   ALKPHOS 87 07/06/2018 1225   ALKPHOS 90 07/04/2017 1120   BILITOT 0.9 07/06/2018 1225   BILITOT 0.56 07/04/2017 1120   GFRNONAA 52 (L) 07/06/2018 1225   GFRNONAA  64 12/11/2017 1520   GFRAA >60 07/06/2018 1225   GFRAA 75 12/11/2017 1520    No results found for: SPEP, UPEP  Lab Results  Component Value Date   WBC 4.8 07/06/2018   NEUTROABS 2.9 07/06/2018   HGB 11.5 (L) 07/06/2018   HCT 36.2 07/06/2018   MCV 80.8 07/06/2018   PLT 227 07/06/2018      Chemistry      Component Value Date/Time   NA 138 07/06/2018 1225   NA 139 07/04/2017 1120   K 3.9 07/06/2018 1225   K 3.7 07/04/2017 1120   CL 103 07/06/2018 1225   CO2 24 07/06/2018 1225   CO2 26 07/04/2017 1120   BUN 20 07/06/2018 1225   BUN 15.8 07/04/2017 1120   CREATININE 1.04 (H) 07/06/2018 1225   CREATININE 0.90 12/11/2017 1520   CREATININE 1.0 07/04/2017 1120      Component Value Date/Time   CALCIUM 10.0 07/06/2018 1225   CALCIUM 9.5 07/04/2017 1120   ALKPHOS 87 07/06/2018 1225   ALKPHOS 90 07/04/2017 1120   AST 19 07/06/2018 1225   AST 17 07/04/2017 1120   ALT 11 07/06/2018 1225   ALT 12 07/04/2017 1120   BILITOT 0.9  07/06/2018 1225   BILITOT 0.56 07/04/2017 1120      All questions were answered. The patient knows to call the clinic with any problems, questions or concerns. No barriers to learning was detected.  I spent 15 minutes counseling the patient face to face. The total time spent in the appointment was 20 minutes and more than 50% was on counseling and review of test results  Heath Lark, MD 07/14/2018 7:42 AM

## 2018-07-23 ENCOUNTER — Telehealth: Payer: Self-pay | Admitting: Rheumatology

## 2018-07-23 NOTE — Telephone Encounter (Signed)
Attempted to contact the patient and left message to advise patient Dr Estanislado Pandy doe snot have information on the weight loss supplement but almond milk is not contraindicated with the diagnosis of lupus.

## 2018-07-23 NOTE — Telephone Encounter (Signed)
I do not have any information on the weight loss supplement.  Almond milk is not contraindicated with diagnosis of lupus.

## 2018-07-23 NOTE — Telephone Encounter (Signed)
Patient states she was got really last week and throwing up approximately 5 times. Patient has been taking a weight loss supplement powder called Invigor8. Patient states that the she has been mixing the powder with almond milk and  Wanted to know if the almond milk in large amounts is a dangerous thing for her to take in because she has Lupus. Please advise.

## 2018-07-23 NOTE — Telephone Encounter (Signed)
Patient requesting a call back from doctor in regards to her Lupus, and dairy products. Please call to discuss.

## 2018-08-01 ENCOUNTER — Other Ambulatory Visit: Payer: Self-pay | Admitting: Rheumatology

## 2018-08-03 ENCOUNTER — Other Ambulatory Visit: Payer: Self-pay | Admitting: Rheumatology

## 2018-08-03 NOTE — Telephone Encounter (Signed)
Last Visit: 12/11/17 Next Visit due July 2019. Message sent to the front to schedule patient.  Labs: 07/06/18 cbc creat 1.04 Hgb 11.5  PLQ Eye Exam: 09/07/17 WNL  Okay to refill per Dr. Estanislado Pandy

## 2018-08-03 NOTE — Telephone Encounter (Signed)
Please schedule patient for a follow up visit. Patient was due July 2019. Thanks!

## 2018-08-04 ENCOUNTER — Other Ambulatory Visit: Payer: Self-pay | Admitting: Rheumatology

## 2018-08-05 ENCOUNTER — Other Ambulatory Visit: Payer: Self-pay | Admitting: Rheumatology

## 2018-08-18 NOTE — Progress Notes (Signed)
Office Visit Note  Patient: Kimberly Vazquez             Date of Birth: 1946/02/21           MRN: [id number]             PCP: Willey Blade, MD Referring: Willey Blade, MD Visit Date: 09/01/2018 Occupation: @GUAROCC @  Subjective:  Pain in multiple joints   History of Present Illness: Kimberly Vazquez is a 72 y.o. female with history of autoimmune disease, DDD, osteoarthritis.  Patient is on PLQ 200 mg 1 tablet by mouth BID M-F.  She denies any recent flares of autoimmune disease.  She states that she is pain in multiple joints that seems to migrate.  She states that her pain is usually in her hands and feet.  She states that she notices intermittent joint swelling.  She states she continues to have rashes on bilateral elbows.  She states she follows up with her dermatologist on a regular basis.  She applies triamcinolone cream when the rash starts to itch.  She denies any facial rashes.  She denies any sun sensitivity but she tries to wear sunscreen on a regular basis.  She denies any sores in her mouth or nose.  Denies any swollen lymph nodes.  She denies any symptoms of Raynaud's.  She continues to have mouth dryness but denies any eye dryness.  Denies any palpitations or shortness of breath. She is planning on having a Plaquenil eye exam after things giving.   Activities of Daily Living:  Patient reports morning stiffness for 10-15 minutes.   Patient Denies nocturnal pain.  Difficulty dressing/grooming: Denies Difficulty climbing stairs: Reports Difficulty getting out of chair: Reports Difficulty using hands for taps, buttons, cutlery, and/or writing: Denies  Review of Systems  Constitutional: Positive for fatigue.  HENT: Positive for mouth dryness.   Eyes: Negative for dryness.  Respiratory: Negative for cough and shortness of breath.   Cardiovascular: Positive for swelling in legs/feet. Negative for chest pain, palpitations and hypertension.  Gastrointestinal: Negative  for blood in stool, constipation and diarrhea.  Genitourinary: Negative for difficulty urinating.  Musculoskeletal: Positive for arthralgias, joint pain, joint swelling, myalgias, muscle weakness, morning stiffness, muscle tenderness and myalgias.  Skin: Positive for rash. Negative for hair loss and sensitivity to sunlight.  Neurological: Positive for headaches. Negative for dizziness, numbness and weakness.  Psychiatric/Behavioral: Negative for depressed mood and sleep disturbance.    PMFS History:  Patient Active Problem List   Diagnosis Date Noted  . Dizziness 07/14/2018  . Hemorrhoids 07/14/2018  . Noncompliance with medication regimen 03/07/2017  . Rheumatoid factor positive 03/07/2017  . Primary insomnia 03/07/2017  . Primary osteoarthritis of both hands 03/07/2017  . Primary osteoarthritis of both knees 03/07/2017  . History of recurrent UTIs 03/07/2017  . Vitamin D deficiency 03/07/2017  . High risk medication use 08/29/2016  . Preventive measure 07/07/2015  . Autoimmune disease ([place]) 06/20/2014  . 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 CKD (chronic kidney disease), stage III ([place]) 06/20/2014    Past Medical History:  Diagnosis Date  . Allergic rhinitis   . Anemia   . Arthritis   . Bladder disorder    "lazy" , caths twice a day  . Fatty liver   . GERD (gastroesophageal reflux disease)   . Hypertension   . Seasonal allergies   . Systemic lupus erythematosus ([place])   . UTI (urinary tract infection)   . Vitamin D deficiency  Family History  Problem Relation Age of Onset  . Cancer Brother        unknown  . Kidney disease Son   . Diabetes Brother   . Heart disease Brother   . Colon cancer Neg Hx    Past Surgical History:  Procedure Laterality Date  . ABDOMINAL HYSTERECTOMY    . ROTATOR CUFF REPAIR Right 2012  . TONSILLECTOMY  1965   Social History   Social History Narrative  . Not on file   Immunization History    Administered Date(s) Administered  . Influenza,inj,Quad PF,6+ Mos 07/07/2015, 07/05/2016, 07/14/2017  . Zoster Recombinat (Shingrix) 01/10/2018, 04/14/2018    Objective: Vital Signs: BP 130/72 (BP Location: Right Arm, Patient Position: Sitting, Cuff Size: Normal)   Pulse (!) 103   Ht 5' 1.5" (1.562 m)   Wt 198 lb 9.6 oz (90.1 kg)   BMI 36.92 kg/m    Physical Exam  Constitutional: She is oriented to person, place, and time. She appears well-developed and well-nourished.  HENT:  Head: Normocephalic and atraumatic.  Eyes: Conjunctivae and EOM are normal.  Neck: Normal range of motion.  Cardiovascular: Normal rate, regular rhythm, normal heart sounds and intact distal pulses.  Pulmonary/Chest: Effort normal and breath sounds normal.  Abdominal: Soft. Bowel sounds are normal.  Lymphadenopathy:    She has no cervical adenopathy.  Neurological: She is alert and oriented to person, place, and time.  Skin: Skin is warm and dry. Capillary refill takes less than 2 seconds.  Psychiatric: She has a normal mood and affect. Her behavior is normal.  Nursing note and vitals reviewed.    Musculoskeletal Exam: C-spine limited ROM.  Thoracic spine and lumbar spine good ROM.  No midline spinal tenderness.  No SI joint tenderness.  Shoulder joints, elbow joints, wrist joints, MCPs, PIPs, and DIPs good ROM with no synovitis. She has complete fist formation bilaterally.  Hip joints, knee joints, knee joints, ankle joints, MTPs, PIPs, and DIPs good ROM with no synovitis.  No warmth or effusion of knee joints.  No tenderness or swelling of ankle joints. No tenderness of trochanteric bursa bilaterally.   CDAI Exam: CDAI Score: Not documented Patient Global Assessment: Not documented; Provider Global Assessment: Not documented Swollen: Not documented; Tender: Not documented Joint Exam   Not documented   There is currently no information documented on the homunculus. Go to the Rheumatology activity and  complete the homunculus joint exam.  Investigation: No additional findings.  Imaging: No results found.  Recent Labs: Lab Results  Component Value Date   WBC 4.8 07/06/2018   HGB 11.5 (L) 07/06/2018   PLT 227 07/06/2018   NA 138 07/06/2018   K 3.9 07/06/2018   CL 103 07/06/2018   CO2 24 07/06/2018   GLUCOSE 77 07/06/2018   BUN 20 07/06/2018   CREATININE 1.04 (H) 07/06/2018   BILITOT 0.9 07/06/2018   ALKPHOS 87 07/06/2018   AST 19 07/06/2018   ALT 11 07/06/2018   PROT 7.7 07/06/2018   ALBUMIN 3.8 07/06/2018   CALCIUM 10.0 07/06/2018   GFRAA >60 07/06/2018    Speciality Comments: PLQ eye exam: 08/28/2017 Normal. Dr. Clent Jacks. Follow up in 1 year.  Procedures:  No procedures performed Allergies: Patient has no known allergies.     Assessment / Plan:     Visit Diagnoses: Autoimmune disease ([place]) - +ANA: She is clinically doing well on Plaquenil 200 mg BID M-F. She has not had any recent flares.  She has no synovitis  on exam.  She has pain in multiple joints including both hands, feet, and the left knee joint.  She continues to have a rash on the extensor surface of bilateral elbow joints.  She follows up with a dermatologist and applies triamcinolone cream on it PRN. She denies having a skin biopsy in the past. No malar rash noted. She has no oral or nasal ulcerations on exam.  She has no palpable cervical lymphadenopathy.  She has no parotid swelling but continues to have mouth dryness.  She has not had any symptoms of Raynaud's and no digital ulcerations were noted. She will continue on PLQ 200 mg 1 tablet BID M-F.  A refill was sent to the pharmacy. We will check autoimmune labs today.- Plan: hydroxychloroquine (PLAQUENIL) 200 MG tablet, Anti-DNA antibody, double-stranded, C3 and C4, Sedimentation rate, Urinalysis, Routine w reflex microscopic, COMPLETE METABOLIC PANEL WITH GFR, CBC with Differential/Platelet  High risk medication use - PLQ eye exam: 08/28/2017. She will be  scheduling her PLQ eye exam after Thanksgiving.  CBC and CMP were drawn today to monitor for drug toxicity. - Plan: COMPLETE METABOLIC PANEL WITH GFR, CBC with Differential/Platelet  Rheumatoid factor positive: She has no synovitis on exam.   Primary osteoarthritis of both hands: She has PIP and DIP synovial thickening. She has no synovitis.  Complete fist formation bilaterally.  She has intermittent bilateral hand pain.  Joint protection and muscle strengthening were discussed.   Primary osteoarthritis of both knees: No warmth or effusion of knee joints. She has occasional left knee pain.   DDD (degenerative disc disease), cervical: She has limited ROM of the C-spine.  No symptoms of radiculopathy at this time.   DDD (degenerative disc disease), lumbar: No midline spinal tenderness.  She has no lower back pain or sciatica at this time.   History of vitamin D deficiency: She is taking vitamin D 2,000 units by mouth daily.   Other medical conditions are listed as follows:   Primary insomnia  History of hypertension  History of recurrent UTIs  CKD (chronic kidney disease), stage III ([facility])  MGUS (monoclonal gammopathy of unknown significance)   Orders: Orders Placed This Encounter  Procedures  . Anti-DNA antibody, double-stranded  . C3 and C4  . Sedimentation rate  . Urinalysis, Routine w reflex microscopic  . COMPLETE METABOLIC PANEL WITH GFR  . CBC with Differential/Platelet   Meds ordered this encounter  Medications  . hydroxychloroquine (PLAQUENIL) 200 MG tablet    Sig: Take 1 tablet (200 mg) by mouth twice daily Monday through Friday only.    Dispense:  120 tablet    Refill:  0      Follow-Up Instructions: Return in about 5 months (around 01/31/2019) for Autoimmune Disease, Osteoarthritis, DDD.   Ofilia Neas, PA-C  Note - This record has been created using Dragon software.  Chart creation errors have been sought, but may not always  have been located. Such  creation errors do not reflect on  the standard of medical care.

## 2018-08-19 ENCOUNTER — Other Ambulatory Visit: Payer: Self-pay | Admitting: Rheumatology

## 2018-09-01 ENCOUNTER — Ambulatory Visit: Payer: Medicare Other | Admitting: Physician Assistant

## 2018-09-01 ENCOUNTER — Encounter: Payer: Self-pay | Admitting: Physician Assistant

## 2018-09-01 VITALS — BP 130/72 | HR 103 | Ht 61.5 in | Wt 198.6 lb

## 2018-09-01 DIAGNOSIS — M5030 Other cervical disc degeneration, unspecified cervical region: Secondary | ICD-10-CM

## 2018-09-01 DIAGNOSIS — D472 Monoclonal gammopathy: Secondary | ICD-10-CM

## 2018-09-01 DIAGNOSIS — Z8679 Personal history of other diseases of the circulatory system: Secondary | ICD-10-CM

## 2018-09-01 DIAGNOSIS — M51369 Other intervertebral disc degeneration, lumbar region without mention of lumbar back pain or lower extremity pain: Secondary | ICD-10-CM

## 2018-09-01 DIAGNOSIS — R7689 Other specified abnormal immunological findings in serum: Secondary | ICD-10-CM

## 2018-09-01 DIAGNOSIS — Z8639 Personal history of other endocrine, nutritional and metabolic disease: Secondary | ICD-10-CM

## 2018-09-01 DIAGNOSIS — N183 Chronic kidney disease, stage 3 unspecified: Secondary | ICD-10-CM

## 2018-09-01 DIAGNOSIS — R768 Other specified abnormal immunological findings in serum: Secondary | ICD-10-CM | POA: Diagnosis not present

## 2018-09-01 DIAGNOSIS — M19042 Primary osteoarthritis, left hand: Secondary | ICD-10-CM

## 2018-09-01 DIAGNOSIS — F5101 Primary insomnia: Secondary | ICD-10-CM

## 2018-09-01 DIAGNOSIS — M19041 Primary osteoarthritis, right hand: Secondary | ICD-10-CM

## 2018-09-01 DIAGNOSIS — Z87440 Personal history of urinary (tract) infections: Secondary | ICD-10-CM

## 2018-09-01 DIAGNOSIS — M170 Bilateral primary osteoarthritis of knee: Secondary | ICD-10-CM

## 2018-09-01 DIAGNOSIS — Z79899 Other long term (current) drug therapy: Secondary | ICD-10-CM | POA: Diagnosis not present

## 2018-09-01 DIAGNOSIS — M5136 Other intervertebral disc degeneration, lumbar region: Secondary | ICD-10-CM

## 2018-09-01 DIAGNOSIS — M359 Systemic involvement of connective tissue, unspecified: Secondary | ICD-10-CM

## 2018-09-01 MED ORDER — HYDROXYCHLOROQUINE SULFATE 200 MG PO TABS: ORAL_TABLET | ORAL | 0 refills | Status: DC

## 2018-09-02 LAB — CBC WITH DIFFERENTIAL/PLATELET
BASOS ABS: 42 {cells}/uL (ref 0–200)
Basophils Relative: 0.8 %
EOS ABS: 374 {cells}/uL (ref 15–500)
Eosinophils Relative: 7.2 %
HCT: 34.8 % — ABNORMAL LOW (ref 35.0–45.0)
Hemoglobin: 11.1 g/dL — ABNORMAL LOW (ref 11.7–15.5)
Lymphs Abs: 1440 cells/uL (ref 850–3900)
MCH: 25.2 pg — AB (ref 27.0–33.0)
MCHC: 31.9 g/dL — AB (ref 32.0–36.0)
MCV: 79.1 fL — ABNORMAL LOW (ref 80.0–100.0)
MONOS PCT: 5.8 %
MPV: 11.5 fL (ref 7.5–12.5)
Neutro Abs: 3042 cells/uL (ref 1500–7800)
Neutrophils Relative %: 58.5 %
PLATELETS: 241 10*3/uL (ref 140–400)
RBC: 4.4 10*6/uL (ref 3.80–5.10)
RDW: 13.2 % (ref 11.0–15.0)
TOTAL LYMPHOCYTE: 27.7 %
WBC mixed population: 302 cells/uL (ref 200–950)
WBC: 5.2 10*3/uL (ref 3.8–10.8)

## 2018-09-02 LAB — COMPLETE METABOLIC PANEL WITH GFR
AG Ratio: 1.3 (calc) (ref 1.0–2.5)
ALT: 11 U/L (ref 6–29)
AST: 19 U/L (ref 10–35)
Albumin: 4 g/dL (ref 3.6–5.1)
Alkaline phosphatase (APISO): 84 U/L (ref 33–130)
BUN/Creatinine Ratio: 21 (calc) (ref 6–22)
BUN: 20 mg/dL (ref 7–25)
CALCIUM: 9.1 mg/dL (ref 8.6–10.4)
CO2: 32 mmol/L (ref 20–32)
Chloride: 105 mmol/L (ref 98–110)
Creat: 0.97 mg/dL — ABNORMAL HIGH (ref 0.60–0.93)
GFR, EST AFRICAN AMERICAN: 68 mL/min/{1.73_m2} (ref >=60)
GFR, EST NON AFRICAN AMERICAN: 58 mL/min/{1.73_m2} — AB (ref >=60)
GLOBULIN: 3 g/dL (ref 1.9–3.7)
GLUCOSE: 94 mg/dL (ref 65–99)
POTASSIUM: 3.8 mmol/L (ref 3.5–5.3)
SODIUM: 142 mmol/L (ref 135–146)
Total Bilirubin: 0.6 mg/dL (ref 0.2–1.2)
Total Protein: 7 g/dL (ref 6.1–8.1)

## 2018-09-02 LAB — URINALYSIS, ROUTINE W REFLEX MICROSCOPIC
BILIRUBIN URINE: NEGATIVE
GLUCOSE, UA: NEGATIVE
Hgb urine dipstick: NEGATIVE
Hyaline Cast: NONE SEEN /LPF
KETONES UR: NEGATIVE
NITRITE: POSITIVE — AB
PROTEIN: NEGATIVE
RBC / HPF: NONE SEEN /HPF (ref 0–2)
SQUAMOUS EPITHELIAL / LPF: NONE SEEN /HPF (ref <=5)
Specific Gravity, Urine: 1.023 (ref 1.001–1.03)
pH: 5.5 (ref 5.0–8.0)

## 2018-09-02 LAB — C3 AND C4
C3 Complement: 139 mg/dL (ref 83–193)
C4 Complement: 27 mg/dL (ref 15–57)

## 2018-09-02 LAB — SEDIMENTATION RATE: SED RATE: 33 mm/h — AB (ref 0–30)

## 2018-09-02 LAB — ANTI-DNA ANTIBODY, DOUBLE-STRANDED

## 2018-10-06 NOTE — Telephone Encounter (Signed)
Entry in error

## 2018-10-22 ENCOUNTER — Telehealth: Payer: Self-pay | Admitting: Rheumatology

## 2018-10-22 ENCOUNTER — Other Ambulatory Visit: Payer: Self-pay | Admitting: Rheumatology

## 2018-10-22 DIAGNOSIS — M359 Systemic involvement of connective tissue, unspecified: Secondary | ICD-10-CM

## 2018-10-22 NOTE — Telephone Encounter (Signed)
Patient called stating she was returning your call.   °

## 2018-10-22 NOTE — Telephone Encounter (Signed)
Last Visit: 09/01/18 Next Visit: 02/02/19  Labs: 09/01/18 Creat. 0.97 Hgb 11.1 HCT 34.8 PLQ Eye Exam: 09/07/17 WNL  Let message to advise patient she is due to update PLQ eye exam.   Okay to refill 30 day supply per Dr. Estanislado Pandy

## 2018-10-22 NOTE — Telephone Encounter (Signed)
Patient advised we need an updated PLQ eye exam. Patient states she updated about 1.5 weeks ago. Patient was provided with fax number and will contact eye doctor to have results faxed.

## 2018-10-29 ENCOUNTER — Inpatient Hospital Stay (HOSPITAL_COMMUNITY)
Admission: EM | Admit: 2018-10-29 | Discharge: 2018-11-06 | DRG: 393 | Disposition: A | Discharge status: Home / Self Care | Payer: Medicare Other | Attending: Internal Medicine | Admitting: Internal Medicine

## 2018-10-29 ENCOUNTER — Other Ambulatory Visit: Payer: Self-pay

## 2018-10-29 ENCOUNTER — Emergency Department (HOSPITAL_COMMUNITY): Payer: Medicare Other

## 2018-10-29 ENCOUNTER — Encounter (HOSPITAL_COMMUNITY): Payer: Self-pay

## 2018-10-29 DIAGNOSIS — K5900 Constipation, unspecified: Secondary | ICD-10-CM | POA: Diagnosis not present

## 2018-10-29 DIAGNOSIS — N183 Chronic kidney disease, stage 3 (moderate): Secondary | ICD-10-CM | POA: Diagnosis present

## 2018-10-29 DIAGNOSIS — E860 Dehydration: Secondary | ICD-10-CM

## 2018-10-29 DIAGNOSIS — K9189 Other postprocedural complications and disorders of digestive system: Secondary | ICD-10-CM

## 2018-10-29 DIAGNOSIS — Z833 Family history of diabetes mellitus: Secondary | ICD-10-CM

## 2018-10-29 DIAGNOSIS — Z90710 Acquired absence of both cervix and uterus: Secondary | ICD-10-CM

## 2018-10-29 DIAGNOSIS — K760 Fatty (change of) liver, not elsewhere classified: Secondary | ICD-10-CM | POA: Diagnosis present

## 2018-10-29 DIAGNOSIS — K317 Polyp of stomach and duodenum: Secondary | ICD-10-CM | POA: Diagnosis present

## 2018-10-29 DIAGNOSIS — I959 Hypotension, unspecified: Secondary | ICD-10-CM | POA: Diagnosis present

## 2018-10-29 DIAGNOSIS — D1330 Benign neoplasm of unspecified part of small intestine: Secondary | ICD-10-CM

## 2018-10-29 DIAGNOSIS — D472 Monoclonal gammopathy: Secondary | ICD-10-CM | POA: Diagnosis present

## 2018-10-29 DIAGNOSIS — K859 Acute pancreatitis without necrosis or infection, unspecified: Secondary | ICD-10-CM | POA: Diagnosis not present

## 2018-10-29 DIAGNOSIS — Z978 Presence of other specified devices: Secondary | ICD-10-CM | POA: Diagnosis not present

## 2018-10-29 DIAGNOSIS — D132 Benign neoplasm of duodenum: Secondary | ICD-10-CM | POA: Diagnosis present

## 2018-10-29 DIAGNOSIS — M329 Systemic lupus erythematosus, unspecified: Secondary | ICD-10-CM | POA: Diagnosis present

## 2018-10-29 DIAGNOSIS — R17 Unspecified jaundice: Secondary | ICD-10-CM

## 2018-10-29 DIAGNOSIS — E559 Vitamin D deficiency, unspecified: Secondary | ICD-10-CM | POA: Diagnosis present

## 2018-10-29 DIAGNOSIS — R932 Abnormal findings on diagnostic imaging of liver and biliary tract: Secondary | ICD-10-CM

## 2018-10-29 DIAGNOSIS — Y848 Other medical procedures as the cause of abnormal reaction of the patient, or of later complication, without mention of misadventure at the time of the procedure: Secondary | ICD-10-CM | POA: Diagnosis not present

## 2018-10-29 DIAGNOSIS — R1013 Epigastric pain: Secondary | ICD-10-CM | POA: Diagnosis not present

## 2018-10-29 DIAGNOSIS — Z87891 Personal history of nicotine dependence: Secondary | ICD-10-CM

## 2018-10-29 DIAGNOSIS — Z8249 Family history of ischemic heart disease and other diseases of the circulatory system: Secondary | ICD-10-CM | POA: Diagnosis not present

## 2018-10-29 DIAGNOSIS — K831 Obstruction of bile duct: Secondary | ICD-10-CM | POA: Diagnosis present

## 2018-10-29 DIAGNOSIS — K219 Gastro-esophageal reflux disease without esophagitis: Secondary | ICD-10-CM | POA: Diagnosis present

## 2018-10-29 DIAGNOSIS — J302 Other seasonal allergic rhinitis: Secondary | ICD-10-CM | POA: Diagnosis present

## 2018-10-29 DIAGNOSIS — N3289 Other specified disorders of bladder: Secondary | ICD-10-CM | POA: Diagnosis present

## 2018-10-29 DIAGNOSIS — R338 Other retention of urine: Secondary | ICD-10-CM | POA: Diagnosis present

## 2018-10-29 DIAGNOSIS — K759 Inflammatory liver disease, unspecified: Secondary | ICD-10-CM

## 2018-10-29 DIAGNOSIS — K8689 Other specified diseases of pancreas: Secondary | ICD-10-CM | POA: Diagnosis present

## 2018-10-29 DIAGNOSIS — Z79899 Other long term (current) drug therapy: Secondary | ICD-10-CM

## 2018-10-29 DIAGNOSIS — I129 Hypertensive chronic kidney disease with stage 1 through stage 4 chronic kidney disease, or unspecified chronic kidney disease: Secondary | ICD-10-CM | POA: Diagnosis present

## 2018-10-29 DIAGNOSIS — D367 Benign neoplasm of other specified sites: Secondary | ICD-10-CM | POA: Diagnosis not present

## 2018-10-29 DIAGNOSIS — K858 Other acute pancreatitis without necrosis or infection: Secondary | ICD-10-CM | POA: Diagnosis not present

## 2018-10-29 DIAGNOSIS — D631 Anemia in chronic kidney disease: Secondary | ICD-10-CM | POA: Diagnosis present

## 2018-10-29 DIAGNOSIS — N179 Acute kidney failure, unspecified: Secondary | ICD-10-CM

## 2018-10-29 DIAGNOSIS — I10 Essential (primary) hypertension: Secondary | ICD-10-CM | POA: Diagnosis not present

## 2018-10-29 DIAGNOSIS — L299 Pruritus, unspecified: Secondary | ICD-10-CM | POA: Diagnosis present

## 2018-10-29 DIAGNOSIS — K838 Other specified diseases of biliary tract: Secondary | ICD-10-CM | POA: Diagnosis not present

## 2018-10-29 DIAGNOSIS — F5101 Primary insomnia: Secondary | ICD-10-CM | POA: Diagnosis present

## 2018-10-29 LAB — COMPREHENSIVE METABOLIC PANEL
ALT: 312 U/L — ABNORMAL HIGH (ref 0–44)
AST: 217 U/L — ABNORMAL HIGH (ref 15–41)
Albumin: 3 g/dL — ABNORMAL LOW (ref 3.5–5.0)
Alkaline Phosphatase: 763 U/L — ABNORMAL HIGH (ref 38–126)
Anion gap: 12 (ref 5–15)
BUN: 56 mg/dL — ABNORMAL HIGH (ref 8–23)
CO2: 23 mmol/L (ref 22–32)
Calcium: 8.7 mg/dL — ABNORMAL LOW (ref 8.9–10.3)
Chloride: 100 mmol/L (ref 98–111)
Creatinine, Ser: 2.91 mg/dL — ABNORMAL HIGH (ref 0.44–1.00)
GFR calc Af Amer: 18 mL/min — ABNORMAL LOW (ref >60)
GFR calc non Af Amer: 15 mL/min — ABNORMAL LOW (ref >60)
Glucose, Bld: 128 mg/dL — ABNORMAL HIGH (ref 70–99)
POTASSIUM: 3.8 mmol/L (ref 3.5–5.1)
Sodium: 135 mmol/L (ref 135–145)
Total Bilirubin: 3.3 mg/dL — ABNORMAL HIGH (ref 0.3–1.2)
Total Protein: 7.7 g/dL (ref 6.5–8.1)

## 2018-10-29 LAB — URINALYSIS, ROUTINE W REFLEX MICROSCOPIC
Glucose, UA: NEGATIVE mg/dL
Hgb urine dipstick: NEGATIVE
Ketones, ur: NEGATIVE mg/dL
Nitrite: NEGATIVE
PROTEIN: 30 mg/dL — AB
Specific Gravity, Urine: 1.02 (ref 1.005–1.030)
pH: 5 (ref 5.0–8.0)

## 2018-10-29 LAB — LIPASE, BLOOD: Lipase: 100 U/L — ABNORMAL HIGH (ref 11–51)

## 2018-10-29 LAB — CBC
HCT: 40.2 % (ref 36.0–46.0)
HEMOGLOBIN: 12.3 g/dL (ref 12.0–15.0)
MCH: 24.5 pg — ABNORMAL LOW (ref 26.0–34.0)
MCHC: 30.6 g/dL (ref 30.0–36.0)
MCV: 79.9 fL — AB (ref 80.0–100.0)
Platelets: 341 10*3/uL (ref 150–400)
RBC: 5.03 MIL/uL (ref 3.87–5.11)
RDW: 16.6 % — ABNORMAL HIGH (ref 11.5–15.5)
WBC: 6.4 10*3/uL (ref 4.0–10.5)
nRBC: 0 % (ref 0.0–0.2)

## 2018-10-29 LAB — C-REACTIVE PROTEIN: CRP: 5.9 mg/dL — ABNORMAL HIGH (ref <1.0)

## 2018-10-29 LAB — CBC WITH DIFFERENTIAL/PLATELET
ABS IMMATURE GRANULOCYTES: 0.03 10*3/uL (ref 0.00–0.07)
Basophils Absolute: 0 10*3/uL (ref 0.0–0.1)
Basophils Relative: 0 %
Eosinophils Absolute: 0.3 10*3/uL (ref 0.0–0.5)
Eosinophils Relative: 5 %
HCT: 42.4 % (ref 36.0–46.0)
Hemoglobin: 13 g/dL (ref 12.0–15.0)
Immature Granulocytes: 0 %
LYMPHS PCT: 10 %
Lymphs Abs: 0.7 10*3/uL (ref 0.7–4.0)
MCH: 24.3 pg — ABNORMAL LOW (ref 26.0–34.0)
MCHC: 30.7 g/dL (ref 30.0–36.0)
MCV: 79.4 fL — ABNORMAL LOW (ref 80.0–100.0)
Monocytes Absolute: 0.4 10*3/uL (ref 0.1–1.0)
Monocytes Relative: 6 %
Neutro Abs: 5.3 10*3/uL (ref 1.7–7.7)
Neutrophils Relative %: 79 %
Platelets: 309 10*3/uL (ref 150–400)
RBC: 5.34 MIL/uL — ABNORMAL HIGH (ref 3.87–5.11)
RDW: 16.7 % — ABNORMAL HIGH (ref 11.5–15.5)
WBC: 6.8 10*3/uL (ref 4.0–10.5)
nRBC: 0 % (ref 0.0–0.2)

## 2018-10-29 LAB — SEDIMENTATION RATE: Sed Rate: 40 mm/hr — ABNORMAL HIGH (ref 0–22)

## 2018-10-29 LAB — I-STAT CG4 LACTIC ACID, ED: Lactic Acid, Venous: 1.81 mmol/L (ref 0.5–1.9)

## 2018-10-29 MED ORDER — LACTATED RINGERS IV SOLN
INTRAVENOUS | Status: DC
  Administered 2018-10-29 – 2018-10-30 (×2): via INTRAVENOUS

## 2018-10-29 MED ORDER — HYDROXYCHLOROQUINE SULFATE 200 MG PO TABS: 200.0000 mg | ORAL_TABLET | Freq: Two times a day (BID) | ORAL | Status: DC

## 2018-10-29 MED ORDER — ONDANSETRON HCL 4 MG/2ML IJ SOLN
4.0000 mg | Freq: Four times a day (QID) | INTRAMUSCULAR | Status: DC | PRN
  Administered 2018-11-01: 4 mg via INTRAVENOUS
  Filled 2018-10-29: qty 2

## 2018-10-29 MED ORDER — ACETAMINOPHEN 325 MG PO TABS
650.0000 mg | ORAL_TABLET | Freq: Four times a day (QID) | ORAL | Status: DC | PRN
  Administered 2018-10-31 – 2018-11-02 (×2): 650 mg via ORAL
  Filled 2018-10-29 (×2): qty 2

## 2018-10-29 MED ORDER — SODIUM CHLORIDE 0.9% FLUSH
3.0000 mL | Freq: Two times a day (BID) | INTRAVENOUS | Status: DC
  Administered 2018-10-30 – 2018-11-06 (×7): 3 mL via INTRAVENOUS

## 2018-10-29 MED ORDER — ONDANSETRON 4 MG PO TBDP
4.0000 mg | ORAL_TABLET | Freq: Once | ORAL | Status: AC | PRN
  Administered 2018-10-29: 4 mg via ORAL
  Filled 2018-10-29: qty 1

## 2018-10-29 MED ORDER — HYDROMORPHONE HCL 1 MG/ML IJ SOLN
0.5000 mg | Freq: Four times a day (QID) | INTRAMUSCULAR | Status: DC | PRN
  Administered 2018-10-31 – 2018-11-01 (×2): 0.5 mg via INTRAVENOUS
  Filled 2018-10-29 (×2): qty 1

## 2018-10-29 MED ORDER — SODIUM CHLORIDE 0.9 % IV BOLUS (SEPSIS)
1000.0000 mL | Freq: Once | INTRAVENOUS | Status: AC
  Administered 2018-10-29: 1000 mL via INTRAVENOUS

## 2018-10-29 MED ORDER — SODIUM CHLORIDE 0.9 % IV SOLN
1.0000 g | Freq: Once | INTRAVENOUS | Status: AC
  Administered 2018-10-29: 1 g via INTRAVENOUS
  Filled 2018-10-29: qty 10

## 2018-10-29 MED ORDER — ONDANSETRON HCL 4 MG PO TABS: 4.0000 mg | ORAL_TABLET | Freq: Four times a day (QID) | ORAL | Status: DC | PRN

## 2018-10-29 MED ORDER — ENOXAPARIN SODIUM 30 MG/0.3ML ~~LOC~~ SOLN
30.0000 mg | SUBCUTANEOUS | Status: DC
  Administered 2018-10-29 – 2018-10-30 (×2): 30 mg via SUBCUTANEOUS
  Filled 2018-10-29 (×2): qty 0.3

## 2018-10-29 MED ORDER — ACETAMINOPHEN 650 MG RE SUPP: 650.0000 mg | Freq: Four times a day (QID) | RECTAL | Status: DC | PRN

## 2018-10-29 MED ORDER — SODIUM CHLORIDE 0.9 % IV SOLN
1000.0000 mL | INTRAVENOUS | Status: DC
  Administered 2018-10-29: 1000 mL via INTRAVENOUS

## 2018-10-29 MED ORDER — HYDROXYCHLOROQUINE SULFATE 200 MG PO TABS
200.0000 mg | ORAL_TABLET | ORAL | Status: DC
  Administered 2018-10-29 – 2018-11-06 (×11): 200 mg via ORAL
  Filled 2018-10-29 (×10): qty 1

## 2018-10-29 NOTE — ED Provider Notes (Addendum)
[facility]ERGENCY DEPARTMENT Provider Note   CSN: [id number] Arrival date & time: 10/29/18  1002     History   Chief Complaint Chief Complaint  Patient presents with  . Emesis    HPI Kimberly Vazquez is a 73 y.o. female.  HPI Patient has medical history significant for lupus.  Reports approximately a week ago she started getting sick and thought she was getting a flulike illness.  She started developing vomiting and abdominal pain.  She was seen by her primary care doctor after a couple of days and advised to take zinc and tumeric for her lupus.  Patient does take Plaquenil daily as well.  Patient reports that she was doing that but she continued to get worse.  She has not been able to eat anything and she is continued to have vomiting.  She has abdominal pain that is now more generalized.  She has not developed any diarrhea really.  She reports maybe a little slight amount of loose stool.  She reports she become very generally weak feeling.  She reports she has had chills and sweats and thinks she has had a fever but has not measured it.  She reports just as of yesterday her doctor started her on amoxicillin but she has only had 1 or 2 doses. Past Medical History:  Diagnosis Date  . Allergic rhinitis   . Anemia   . Arthritis   . Bladder disorder    "lazy" , caths twice a day  . Fatty liver   . GERD (gastroesophageal reflux disease)   . Hypertension   . Seasonal allergies   . Systemic lupus erythematosus ([place])   . UTI (urinary tract infection)   . Vitamin D deficiency     Patient Active Problem List   Diagnosis Date Noted  . Dizziness 07/14/2018  . Hemorrhoids 07/14/2018  . Noncompliance with medication regimen 03/07/2017  . Rheumatoid factor positive 03/07/2017  . Primary insomnia 03/07/2017  . Primary osteoarthritis of both hands 03/07/2017  . Primary osteoarthritis of both knees 03/07/2017  . History of recurrent UTIs 03/07/2017  . Vitamin D  deficiency 03/07/2017  . High risk medication use 08/29/2016  . Preventive measure 07/07/2015  . Autoimmune disease ([place]) 06/20/2014  . 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 CKD (chronic kidney disease), stage III ([place]) 06/20/2014    Past Surgical History:  Procedure Laterality Date  . ABDOMINAL HYSTERECTOMY    . ROTATOR CUFF REPAIR Right 2012  . TONSILLECTOMY  1965     OB History   No obstetric history on file.      Home Medications    Prior to Admission medications   Medication Sig Start Date End Date Taking? Authorizing Provider  chlorhexidine (PERIDEX) 0.12 % solution RINSE WITH 1 CAPFUL ONCE DAILY AND EXPECTORATE. DO NOT RINSE WITH WATER 08/14/16   [provider]  cholecalciferol (VITAMIN D) 1000 UNITS tablet Take 2,000 Units by mouth daily.    [provider]  hydroxychloroquine (PLAQUENIL) 200 MG tablet TAKE ONE TABLET BY MOUTH TWICE A DAY MONDAY THROUGH FRIDAY. 10/22/18   Bo Merino, MD  Multiple Vitamins-Minerals (ONE-A-DAY ENERGY PO) Take by mouth.    [provider]  nitrofurantoin (MACRODANTIN) 50 MG capsule Take 50 mg by mouth once.    [provider]  nortriptyline (PAMELOR) 25 MG capsule TAKE 1 CAPSULE (25 MG TOTAL) BY MOUTH AT BEDTIME. 10/31/17   Jessy Oto, MD  Omega-3 Fatty Acids (FISH OIL) 500  MG CAPS Take 500 mg by mouth daily.    [provider]  triamterene-hydrochlorothiazide (MAXZIDE-25) 37.5-25 MG per tablet Take 1 tablet by mouth daily.    [provider]  valsartan (DIOVAN) 160 MG tablet Take 160 mg by mouth daily.    [provider]    Family History Family History  Problem Relation Age of Onset  . Cancer Brother        unknown  . Kidney disease Son   . Diabetes Brother   . Heart disease Brother   . Colon cancer Neg Hx     Social History Social History   Tobacco Use  . Smoking status: Former Smoker    Years: 22.00    Types: Cigarettes     Last attempt to quit: 03/21/2002    Years since quitting: 16.6  . Smokeless tobacco: Never Used  Substance Use Topics  . Alcohol use: Yes    Comment: rare  . Drug use: No     Allergies   Patient has no known allergies.   Review of Systems Review of Systems 10 Systems reviewed and are negative for acute change except as noted in the HPI.  Physical Exam Updated Vital Signs BP 99/62 (BP Location: Left Arm)   Pulse 84   Temp 97.6 F (36.4 C) (Oral)   Resp 14   SpO2 94%   Physical Exam Constitutional:      Comments: Patient is alert and appropriate.  She is nontoxic.  Mental status is clear.  She does not have respiratory distress.  HENT:     Head: Normocephalic and atraumatic.     Nose: Nose normal.  Eyes:     Extraocular Movements: Extraocular movements intact.  Neck:     Musculoskeletal: Neck supple.  Cardiovascular:     Rate and Rhythm: Normal rate and regular rhythm.  Pulmonary:     Effort: Pulmonary effort is normal.     Breath sounds: Normal breath sounds.  Abdominal:     Comments: Abdomen is soft.  Moderate diffuse tenderness centrally.  No guarding.  Musculoskeletal:        General: No swelling or tenderness.     Right lower leg: No edema.     Left lower leg: No edema.  Skin:    General: Skin is warm and dry.  Neurological:     General: No focal deficit present.     Mental Status: She is oriented to person, place, and time.     Coordination: Coordination normal.  Psychiatric:        Mood and Affect: Mood normal.      ED Treatments / Results  Labs (all labs ordered are listed, but only abnormal results are displayed) Labs Reviewed  LIPASE, BLOOD - Abnormal; Notable for the following components:      Result Value   Lipase 100 (*)    All other components within normal limits  COMPREHENSIVE METABOLIC PANEL - Abnormal; Notable for the following components:   Glucose, Bld 128 (*)    BUN 56 (*)    Creatinine, Ser 2.91 (*)    Calcium 8.7 (*)     Albumin 3.0 (*)    AST 217 (*)    ALT 312 (*)    Alkaline Phosphatase 763 (*)    Total Bilirubin 3.3 (*)    GFR calc non Af Amer 15 (*)    GFR calc Af Amer 18 (*)    All other components within normal limits  CBC -  Abnormal; Notable for the following components:   MCV 79.9 (*)    MCH 24.5 (*)    RDW 16.6 (*)    All other components within normal limits  SEDIMENTATION RATE - Abnormal; Notable for the following components:   Sed Rate 40 (*)    All other components within normal limits  C-REACTIVE PROTEIN - Abnormal; Notable for the following components:   CRP 5.9 (*)    All other components within normal limits  CBC WITH DIFFERENTIAL/PLATELET - Abnormal; Notable for the following components:   RBC 5.34 (*)    MCV 79.4 (*)    MCH 24.3 (*)    RDW 16.7 (*)    All other components within normal limits  URINALYSIS, ROUTINE W REFLEX MICROSCOPIC  HEPATITIS PANEL, ACUTE  I-STAT CG4 LACTIC ACID, ED    EKG None  Radiology US Abdomen Limited  Result Date: 10/29/2018 CLINICAL DATA:  Hepatitis with nausea and vomiting x5 days EXAM: ULTRASOUND ABDOMEN LIMITED RIGHT UPPER QUADRANT COMPARISON:  Eight 6 2009 renal ultrasound, CT 02/17/2006 FINDINGS: Gallbladder: The gallbladder is distended without stones. No wall thickening or pericholecystic fluid is noted. Single wall thickness is 2.6 mm. Common bile duct: Diameter: Dilated to 12 mm without choledocholithiasis identified. No stricture nor mass is identified and there is no intrahepatic ductal dilatation suggesting that this may be ectatic as opposed to obstructive dilatation. Liver: No focal lesion identified. The left lobe was obscured by bowel gas. Normal echogenicity of the liver parenchyma. Portal vein is patent on color Doppler imaging with normal direction of blood flow towards the liver. IMPRESSION: 1. Homogeneous appearance of the liver without space-occupying mass. No sonographic evidence of cirrhosis. 2. Physiologic distention of the  gallbladder without stones. 3. Ectatic/dilated common bile duct without calculus. Electronically Signed   By: Ashley Royalty M.D.   On: 10/29/2018 14:27    Procedures Procedures (including critical care time)  Medications Ordered in ED Medications  sodium chloride 0.9 % bolus 1,000 mL (1,000 mLs Intravenous New Bag/Given 10/29/18 1502)    Followed by  0.9 %  sodium chloride infusion (has no administration in time range)  ondansetron (ZOFRAN-ODT) disintegrating tablet 4 mg (4 mg Oral Given 10/29/18 1014)     Initial Impression / Assessment and Plan / ED Course  I have reviewed the triage vital signs and the nursing notes.  Pertinent labs & imaging results that were available during my care of the patient were reviewed by me and considered in my medical decision making (see chart for details).  Clinical Course as of Nov 08 2343  Thu Oct 29, 2018  1636 Discussed with Dr. Collene Mares from GI.  He found out the patient is actually a low [facility] patient and asked me to consult them instead.  I put a phone call in for [facility].   [MB]  1644 Discussed with the [facility] w will evaluate the patient tomorrow.  Agrees with ho ordering MRCP and does not think needs any broader antibiotic coverage at this time.   [MB]    Clinical Course User Index [MB] Hayden Rasmussen, MD   Patient has had vomiting and poor oral intake for about a week.  She has abdominal pain which she describes as being diffuse.  She has elevated total bilirubin and LFTs.  Ultrasound is not showing cholecystitis or apparent biliary ductal obstructions.  Patient is being hydrated.  She remains alert and appropriate.  She is in no acute distress.  CT scan  is pending.  Patient will require admission for ongoing hydration and diagnostic evaluation.  At this time, consult to gastroenterology has been ordered and is pending (15:49)  Final Clinical Impressions(s) / ED Diagnoses   Final diagnoses:  Dehydration  Hepatitis  Lupus [facility])     ED Discharge Orders    None       Charlesetta Shanks, MD 10/29/18 1550    Charlesetta Shanks, MD 11/07/18 2345

## 2018-10-29 NOTE — Plan of Care (Signed)
  Problem: Safety: Goal: Ability to remain free from injury will improve Outcome: Progressing   

## 2018-10-29 NOTE — ED Triage Notes (Signed)
Pt endorses n/v x 5 days, has hx of lupus. Also states that she had an allergic reaction 5 days ago but does not know what caused it. Also endorses 16 lb weight loss in 5 days.

## 2018-10-29 NOTE — ED Notes (Signed)
MD aware of hypotension

## 2018-10-29 NOTE — H&P (Signed)
Date: 10/29/2018               Patient Name:  Kimberly Vazquez MRN: [id number]  DOB: 1946/01/30 Age / Sex: 73 y.o., female   PCP: Willey Blade, MD         Medical Service: Internal Medicine Teaching Service         Attending Physician: Dr. Lucious Groves, DO    First Contact: Dr. Koleen Distance Pager: [phone]  Second Contact: Dr. Frederico Hamman Pager: [phone]       After Hours (After 5p/  First Contact Pager: [phone]  weekends / holidays): Second Contact Pager: [phone]   Chief Complaint: abdominal pain, n/v  History of Present Illness: Kimberly Vazquez is a 73 y/o woman with PMHx of SLE and HTN who presents with 5 day history of abdominal pain, nausea, vomiting. The abdominal pain is located primarily in her epigastric area and is waxing and waning in nature. The pain does not radiate anywhere. Endorses some diarrhea and subjective fevers/chills as well. Denies hematemesis, melena or hematochezia. She has not had an appetite and has been unable to keep anything down since symptom onset. She denies any history of abdominal pain or loose stools after eating.  Patient has been recovering from an upper respiratory viral illness for the last 2 weeks. Her symptoms of nasal congestion, sore throat and productive cough have been resolving over the last few days.  Kimberly Vazquez reports chronic urinary retention and self catheterizes 1-3x/day at home depending on her fluid intake. She notes for the last week or so it has appeared darker in color. She has not had any pain with catheterizing.  Regarding her SLE, she has only ever had symptoms of skin and joint inflammation.   Meds: Hydroxychloroquine 200 mg BID             Nortriptyline 25 mg qhs             Triamterene-HCTZ 37.5-25 mg daily             Valsartan 160 mg daily   Allergies: Allergies as of 10/29/2018  . (No Known Allergies)   Past Medical History:  Diagnosis Date  . Allergic rhinitis   . Anemia   . Arthritis   . Bladder  disorder    "lazy" , caths twice a day  . Fatty liver   . GERD (gastroesophageal reflux disease)   . Hypertension   . Seasonal allergies   . Systemic lupus erythematosus ([place])   . UTI (urinary tract infection)   . Vitamin D deficiency     Family History: HTN, DM   Social History: prior smoking history, quit >20 years ago; occasional EtOH use; no illicit drug use   Review of Systems: A complete ROS was negative except as per HPI.   Physical Exam: Blood pressure 111/70, pulse 87, temperature 97.6 F (36.4 C), temperature source Oral, resp. rate 16, SpO2 99 %. General: awake, alert, pleasant female lying in bed in NAD. Non-toxic appearing HEENT: [place]/AT, no scleral icterus  Neck: supple, no thyromegaly CV: RRR; no murmurs, rubs or gallops Pulm: normal respiratory effort; lungs CTA bilaterally Abd: BS+; abdomen is soft, non-distended, mild TTP of RUQ and RLQ  Neuro: A&Ox3; answers questions and following commands appropriately; no focal deficits Ext: no pitting edema  Assessment & Plan by Problem: Active Problems:   Choledocholithiasis Kimberly Vazquez is a 73 y/o woman with PMHx SLE, HTN who presents with 5 day history of abdominal pain, nausea  and vomiting. CBC revealed normal white count. CMP revealed creatinine of 2.9 (baseline ~1), AST 217, ALT 312, alk phos of 763, and total bili of 3.3. RUQ u/s showed homogenous liver, distention of gallbladder without stones, and dilated CBD without visible stone. CT scan was also obtained which showed severed CBD dilation without definite evidence of stone, as well as pancreatic ductal dilation.   1. Choledocholithiasis - uncomplicated  - labs and imaging consistent with biliary obstruction - patient is afebrile without leukocytosis and does not appear jaundiced on exam to suggest cholangitis  - she does have an elevated lipase of 100, but no pancreatic inflammation seen on CT  - it is possible she has already passed a stone or is not visible  on current imaging - GI has been consulted and will proceed with MRCP - she is NPO  - we will continue supportive care with IVF, analgesics and anti-emetics  2. AKI - most likely pre-renal in the setting of dehydration from GI losses - giving IVF and will continue to monitor - CT did not show hydronephrosis or enlarged bladder  - if renal function does not improve with IVF, will need further work-up to determine if she has renal involvement with her SLE. Lower suspicion for this given her lupus typically flares with joint pains and her symptoms have been well controlled on Plaquenil.   3. HTN - holding anti-hypertensives due to softer blood pressures on arrival and AKI  4. SLE - continue home dose of Plaquenil 200 mg BID mon-fri  5. Chronic urinary retention - in and out cath q 8 prn      Diet: NPO DVT ppx: Lovenox CODE: FULL   Dispo: Admit patient to Inpatient with expected length of stay greater than 2 midnights.  SignedDelice Bison, DO 10/29/2018, 7:01 PM  Pager: [phone]

## 2018-10-29 NOTE — Consult Note (Addendum)
[facility] Consult: 8:16 AM 10/30/2018  LOS: 1 day    Referring Provider: Dr Heber Colfax  Primary Care Physician:  Willey Blade, MD Primary Gastroenterologist:  Dr. Fuller Plan for screening colonoscopy.       Reason for Consultation:  Elevated LFTs and abnormal imaging.     HPI: Kimberly Vazquez is a 73 y.o. female.  Hx lupus, on Plaquenil. Htn.  Anemia.  Urinary bladder dysfunction.  Carries label of fatty liver but review of previous imaging does not confirm this.    01/2014 Colonoscopy: 5 mm sigmoid, hyperplastic polyp   Patient has had periodic postprandial vomiting, occurring a couple of times a month over the last 6 months.  There is no associated abdominal pain with this.  Otherwise appetite is good.  Occasional sensation of burning and globus in her upper esophageal region which she treats with what sounds like chewable calcium carbonate.  About 2 weeks ago because of lupus body aches and pains Dr. Karlton Lemon suggested a trial of a turmeric supplement.  After 5 days the patient stopped taking this because she had generalized pruritus and darkening of her urine.  Starting 10 days ago she developed sneezing, productive cough, sweats.  There was no worsening of her usual body aches and pains associated with her lupus.  Dr. Karlton Lemon suggested zinc supplements but no antibiotics.  Patient had already received her annual flu vaccination.  Short while after that patient developed intense globus sensation, nausea, vomiting.  Cough continued.  She became oliguric.  The generalized pruritus subsided but she still had occasional itching.  Yesterday she felt so miserable that her husband brought her to the ED.  T bili 3.3 >> 4.   Alk phos 763 >>686.  AST/ALT 217/312 >>181/266.  Lipase 100. WBCs, + AKI. Hgb, platelets normal.      Previously normal LFTs on several assays in 2015 - 2019.      CT scan: Normal liver.  Gallbladder dilatation with severe dilatation of common bile duct without definite evidence of obstructing calculus. Some degree of pancreatic ductal dilatation . Possible pancreatic mass can not be excluded. Further evaluation with MRCP is recommended  Ultrasound: 1. Homogeneous appearance of the liver without space-occupying mass. No sonographic evidence of cirrhosis.  2. Physiologic distention of the gallbladder without stones.  3. Ectatic/dilated common bile duct without calculus. Ordering MRCP d/w ED MD last PM, has not been ordered.    Patient occasionally has a glass of wine on a weekend.  She does not recall previous problems with her liver. Her mother suffered gallbladder disease, sounds like she had cholecystectomy in her 68s.    Past Medical History:  Diagnosis Date  . Allergic rhinitis   . Anemia   . Arthritis   . Bladder disorder    "lazy" , caths twice a day  . Fatty liver   . GERD (gastroesophageal reflux disease)   . Hypertension   . Seasonal allergies   . Systemic lupus erythematosus ([employer])   . UTI (urinary tract infection)   . Vitamin D deficiency  Past Surgical History:  Procedure Laterality Date  . ABDOMINAL HYSTERECTOMY    . ROTATOR CUFF REPAIR Right 2012  . TONSILLECTOMY  1965    Prior to Admission medications   Medication Sig Start Date End Date Taking? Authorizing Provider  chlorhexidine (PERIDEX) 0.12 % solution RINSE WITH 1 CAPFUL ONCE DAILY AND EXPECTORATE. DO NOT RINSE WITH WATER 08/14/16   [provider]  cholecalciferol (VITAMIN D) 1000 UNITS tablet Take 2,000 Units by mouth daily.    [provider]  hydroxychloroquine (PLAQUENIL) 200 MG tablet TAKE ONE TABLET BY MOUTH TWICE A DAY MONDAY THROUGH FRIDAY. 10/22/18   Bo Merino, MD  Multiple Vitamins-Minerals (ONE-A-DAY ENERGY PO) Take by mouth.    [provider]   nitrofurantoin (MACRODANTIN) 50 MG capsule Take 50 mg by mouth once.    [provider]  nortriptyline (PAMELOR) 25 MG capsule TAKE 1 CAPSULE (25 MG TOTAL) BY MOUTH AT BEDTIME. 10/31/17   Jessy Oto, MD  Omega-3 Fatty Acids (FISH OIL) 500 MG CAPS Take 500 mg by mouth daily.    [provider]  triamterene-hydrochlorothiazide (MAXZIDE-25) 37.5-25 MG per tablet Take 1 tablet by mouth daily.    [provider]  valsartan (DIOVAN) 160 MG tablet Take 160 mg by mouth daily.    [provider]    Scheduled Meds: . enoxaparin (LOVENOX) injection  30 mg Subcutaneous Q24H  . hydroxychloroquine  200 mg Oral 2 times per day on Mon Tue Wed Thu Fri  . sodium chloride flush  3 mL Intravenous Q12H   Infusions: . lactated ringers 100 mL/hr at 10/30/18 0657   PRN Meds:    Allergies as of 10/29/2018  . (No Known Allergies)    Family History  Problem Relation Age of Onset  . Cancer Brother        unknown  . Kidney disease Son   . Diabetes Brother   . Heart disease Brother   . Colon cancer Neg Hx     Social History   Socioeconomic History  . Marital status: Married    Spouse name: Not on file  . Number of children: 2  . Years of education: Not on file  . Highest education level: Not on file  Occupational History  . Occupation: Retired [employer] Professor    Employer: RETIRED  Social Needs  . Financial resource strain: Not on file  . Food insecurity:    Worry: Not on file    Inability: Not on file  . Transportation needs:    Medical: Not on file    Non-medical: Not on file  Tobacco Use  . Smoking status: Former Smoker    Years: 22.00    Types: Cigarettes    Last attempt to quit: 03/21/2002    Years since quitting: 16.6  . Smokeless tobacco: Never Used  Substance and Sexual Activity  . Alcohol use: Yes    Comment: rare  . Drug use: No  . Sexual activity: Not on file  Lifestyle  . Physical activity:    Days per week: Not on file     Minutes per session: Not on file  . Stress: Not on file  Relationships  . Social connections:    Talks on phone: Not on file    Gets together: Not on file    Attends religious service: Not on file    Active member of club or organization: Not on file    Attends meetings of clubs or organizations: Not on file  Relationship status: Not on file  . Intimate partner violence:    Fear of current or ex partner: Not on file    Emotionally abused: Not on file    Physically abused: Not on file    Forced sexual activity: Not on file  Other Topics Concern  . Not on file  Social History Narrative  . Not on file    REVIEW OF SYSTEMS: Constitutional: Feels weak, tired. ENT:  No nose bleeds Pulm: Duct of cough. CV:  No palpitations, no LE edema.  No symptoms. GU: Dark-colored urine, oliguria. GI: Per HPI Heme: No excessive or unusual bleeding or bruising. Transfusions: None Neuro:  No headaches, no peripheral tingling or numbness.  No syncope. Derm: Pruritus.  No unusual lesions or nonhealing sores. Endocrine:  No sweats or chills.  No polyuria or dysuria Immunization: Had her flu shot this year.    PHYSICAL EXAM: Vital signs in last 24 hours: Vitals:   10/29/18 2121 10/30/18 0501  BP: 113/67 119/73  Pulse: 89 88  Resp: 18 20  Temp: (!) 97.4 F (36.3 C) 98.2 F (36.8 C)  SpO2: 94% 100%   Wt Readings from Last 3 Encounters:  09/01/18 90.1 kg  07/13/18 88.4 kg  12/11/17 88.5 kg    General: Pleasant, well-appearing, alert AAF. Head: No facial asymmetry or swelling.  No signs of head trauma. Eyes: Very slight scleral icterus, could easily be overlooked.  EOMI.  No conjunctival pallor. Ears: Not hard of hearing Nose: No congestion or discharge. Mouth: Tongue midline.  Oral mucosa moist, pink, clear.  Good dental repair. Neck: No JVD, no masses, no thyromegaly. Lungs: Clear bilaterally.  No labored breathing or cough. Heart: RRR.  No MRG.  S1, S2 present Abdomen: Soft.   Active bowel sounds.  Not distended.  Minimal if any tenderness.  She says she feels a little sore bilaterally but more so on the right.  I was unable to elicit tenderness from the exam..   Rectal: Deferred Musc/Skeltl: No joint redness or swelling. Extremities: No CCE. Neurologic: Alert.  Oriented x3.  Moves all 4 limbs with full strength.  No tremors.  No gross deficits. Skin: No obvious jaundice. Tattoos: None Nodes: No cervical adenopathy Psych: Calm, pleasant, cooperative, appropriate.  Good historian.  Intake/Output from previous day: 01/09 0701 - 01/10 0700 In: 1100 [IV Piggyback:1100] Out: -  Intake/Output this shift: No intake/output data recorded.  LAB RESULTS: Recent Labs    10/29/18 1025 10/29/18 1040 10/30/18 0228  WBC 6.4 6.8 6.3  HGB 12.3 13.0 10.2*  HCT 40.2 42.4 32.7*  PLT 341 309 264   BMET Lab Results  Component Value Date   NA 137 10/30/2018   NA 135 10/29/2018   NA 142 09/01/2018   K 3.8 10/30/2018   K 3.8 10/29/2018   K 3.8 09/01/2018   CL 105 10/30/2018   CL 100 10/29/2018   CL 105 09/01/2018   CO2 20 (L) 10/30/2018   CO2 23 10/29/2018   CO2 32 09/01/2018   GLUCOSE 72 10/30/2018   GLUCOSE 128 (H) 10/29/2018   GLUCOSE 94 09/01/2018   BUN 54 (H) 10/30/2018   BUN 56 (H) 10/29/2018   BUN 20 09/01/2018   CREATININE 2.26 (H) 10/30/2018   CREATININE 2.91 (H) 10/29/2018   CREATININE 0.97 (H) 09/01/2018   CALCIUM 8.4 (L) 10/30/2018   CALCIUM 8.7 (L) 10/29/2018   CALCIUM 9.1 09/01/2018   LFT Recent Labs    10/29/18 1025 10/30/18 0228  PROT 7.7  6.7  ALBUMIN 3.0* 2.8*  AST 217* 181*  ALT 312* 266*  ALKPHOS 763* 686*  BILITOT 3.3* 4.0*   Lipase     Component Value Date/Time   LIPASE 100 (H) 10/29/2018 1025     RADIOLOGY STUDIES: Ct Abdomen Pelvis Wo Contrast  Result Date: 10/29/2018 CLINICAL DATA:  Nausea, vomiting. EXAM: CT ABDOMEN AND PELVIS WITHOUT CONTRAST TECHNIQUE: Multidetector CT imaging of the abdomen and pelvis was  performed following the standard protocol without IV contrast. COMPARISON:  CT scan of February 17, 2006. FINDINGS: Lower chest: No acute abnormality. Hepatobiliary: No focal abnormality is seen in the liver on these unenhanced images. No gallstones are noted. However, dilated gallbladder is noted with severe dilatation of common bile duct without definite evidence of obstructing calculus. Pancreas: No definite inflammation is noted. Dilatation of pancreatic duct in head is noted of unknown etiology. Spleen: Normal in size without focal abnormality. Adrenals/Urinary Tract: Adrenal glands are unremarkable. Kidneys are normal, without renal calculi, focal lesion, or hydronephrosis. Bladder is unremarkable. Stomach/Bowel: Stomach is within normal limits. Appendix appears normal. No evidence of bowel wall thickening, distention, or inflammatory changes. Vascular/Lymphatic: Aortic atherosclerosis. No enlarged abdominal or pelvic lymph nodes. Reproductive: Status post hysterectomy. No adnexal masses. Other: No abdominal wall hernia or abnormality. No abdominopelvic ascites. Musculoskeletal: No acute or significant osseous findings. IMPRESSION: Gallbladder dilatation is now noted with severe dilatation of common bile duct without definite evidence of obstructing calculus. Some degree of pancreatic ductal dilatation is noted as well. Possible pancreatic mass can not be excluded. Further evaluation with MRCP is recommended to evaluate for possible cause of obstruction. Aortic Atherosclerosis (ICD10-I70.0). Electronically Signed   By: James  Green Jr, M.D.   On: 10/29/2018 16:32   Us Abdomen Limited  Result Date: 10/29/2018 CLINICAL DATA:  Hepatitis with nausea and vomiting x5 days EXAM: ULTRASOUND ABDOMEN LIMITED RIGHT UPPER QUADRANT COMPARISON:  Eight 6 2009 renal ultrasound, CT 02/17/2006 FINDINGS: Gallbladder: The gallbladder is distended without stones. No wall thickening or pericholecystic fluid is noted. Single wall  thickness is 2.6 mm. Common bile duct: Diameter: Dilated to 12 mm without choledocholithiasis identified. No stricture nor mass is identified and there is no intrahepatic ductal dilatation suggesting that this may be ectatic as opposed to obstructive dilatation. Liver: No focal lesion identified. The left lobe was obscured by bowel gas. Normal echogenicity of the liver parenchyma. Portal vein is patent on color Doppler imaging with normal direction of blood flow towards the liver. IMPRESSION: 1. Homogeneous appearance of the liver without space-occupying mass. No sonographic evidence of cirrhosis. 2. Physiologic distention of the gallbladder without stones. 3. Ectatic/dilated common bile duct without calculus. Electronically Signed   By: David  Kwon M.D.   On: 10/29/2018 14:27     IMPRESSION:   *   Elevated LFTs, cholestatic pattern with increased alk phos. Dilated CBD, PD without obstructing stone or mass.  N/V for several days.   PT/INR not yet assayed.   Plaquenil linked to hepatic failure but not changes in bile ducts.  May have DILI from Turmeric but again this does not explain biliary dilatation.     *   AKI  *   ? UTI?  WBCs, RBCs, rare bacteria, moderate leukocytes in urine.  Single dose Rocephin yesterday.  cllx is pending.   No dysruria or other sxs to suggest UTI.     *   SLE.  On Plaquenil.  *    Anemia.  Normocytic today but slight microcytosis yesterday prior   to drop in Hgb.  Suspect the drop in hemoglobin I is due to volume expansion following.  Of dehydration.    PLAN:     *   MRI, MRCP.  Ordered stat.    *   PT/INR. ordered   *  ? Hold Plaquenil for time being?  *   Increase rate of LR to 200/hr for 8 hours, then to 125/hour.    *   CMET in AM.   Acute hepatitis panel is pending.      Azucena Freed  10/30/2018, 8:16 AM Phone [phone]  Addendum 1330:  MRI/MRCP:  1. Again seen is marked dilatation of the common bile duct and intrahepatic ducts. There is also  significant dilatation of the main pancreatic duct up to the level of the ampulla. No obstructing stone or mass identified. A small ampullary tumor or stricture can not be excluded. Further investigation with ERCP is recommended. 2. Borderline enlarged porta hepatic node measures 1.1 cm.  Plan: set up ERCP tomorrow.      [facility] Attending   I have taken an interval history, reviewed the chart and examined the patient. I agree with the Advanced Practitioner's note, impression and recommendations.   She has obstructive jaundice and a double duct sign but no sign of pancreatic tumor on imaging thus far. Ampullary lesion certainly possible. AKI noted - doubt connected   ERCP tomorrow The risks and benefits as well as alternatives of endoscopic procedure(s) have been discussed and reviewed. All questions answered. The patient agrees to proceed.  Gatha Mayer, MD, [facility] 10/30/2018 3:21 PM Pager [phone]

## 2018-10-29 NOTE — Progress Notes (Signed)
Present to place stat PIV.  Pt. In x-ray RN to notify when patient back in room

## 2018-10-29 NOTE — ED Provider Notes (Signed)
Patient signed out to me from Dr. Vallery Ridge.  73 year old female history of lupus here with 1 week of flulike illness abdominal pain nausea vomiting.  She is got a new AKI and new elevations in her LFTs and lipase.  Right upper quadrant ultrasound did not show any definitive findings.  She is a CT abdomen and pelvis that is done and pending reading.  Plan was to consult GI for recommendations and have admitted to hospital for continued work-up and management.  Her urine looks slightly infected and with this new intra-abdominal process about it reasonable to start her on some IV antibiotics and have order ceftriaxone.  I have reviewed the case with GI and they will consult on her.  Clinical Course as of Oct 29 2220  Thu Oct 29, 2018  1636 Discussed with Dr. Collene Mares from GI.  He found out the patient is actually a [facility] patient and asked me to consult them instead.  I put a phone call in for [facility].   [MB]  1644 Discussed with the [facility] w will evaluate the patient tomorrow.  Agrees with ho ordering MRCP and does not think needs any broader antibiotic coverage at this time.   [MB]    Clinical Course User Index [MB] Hayden Rasmussen, MD      Hayden Rasmussen, MD 10/29/18 2222

## 2018-10-29 NOTE — H&P (View-Only) (Signed)
[facility] Consult: 8:16 AM 10/30/2018  LOS: 1 day    Referring Provider: Dr Heber [place]  Primary Care Physician:  Willey Blade, MD Primary Gastroenterologist:  Dr. Fuller Plan for screening colonoscopy.       Reason for Consultation:  Elevated LFTs and abnormal imaging.     HPI: Kimberly Vazquez is a 73 y.o. female.  Hx lupus, on Plaquenil. Htn.  Anemia.  Urinary bladder dysfunction.  Carries label of fatty liver but review of previous imaging does not confirm this.    01/2014 Colonoscopy: 5 mm sigmoid, hyperplastic polyp   Patient has had periodic postprandial vomiting, occurring a couple of times a month over the last 6 months.  There is no associated abdominal pain with this.  Otherwise appetite is good.  Occasional sensation of burning and globus in her upper esophageal region which she treats with what sounds like chewable calcium carbonate.  About 2 weeks ago because of lupus body aches and pains Dr. Karlton Lemon suggested a trial of a turmeric supplement.  After 5 days the patient stopped taking this because she had generalized pruritus and darkening of her urine.  Starting 10 days ago she developed sneezing, productive cough, sweats.  There was no worsening of her usual body aches and pains associated with her lupus.  Dr. Karlton Lemon suggested zinc supplements but no antibiotics.  Patient had already received her annual flu vaccination.  Short while after that patient developed intense globus sensation, nausea, vomiting.  Cough continued.  She became oliguric.  The generalized pruritus subsided but she still had occasional itching.  Yesterday she felt so miserable that her husband brought her to the ED.  T bili 3.3 >> 4.   Alk phos 763 >>686.  AST/ALT 217/312 >>181/266.  Lipase 100. WBCs, + AKI. Hgb, platelets normal.      Previously normal LFTs on several assays in 2015 - 2019.      CT scan: Normal liver.  Gallbladder dilatation with severe dilatation of common bile duct without definite evidence of obstructing calculus. Some degree of pancreatic ductal dilatation . Possible pancreatic mass can not be excluded. Further evaluation with MRCP is recommended  Ultrasound: 1. Homogeneous appearance of the liver without space-occupying mass. No sonographic evidence of cirrhosis.  2. Physiologic distention of the gallbladder without stones.  3. Ectatic/dilated common bile duct without calculus. Ordering MRCP d/w ED MD last PM, has not been ordered.    Patient occasionally has a glass of wine on a weekend.  She does not recall previous problems with her liver. Her mother suffered gallbladder disease, sounds like she had cholecystectomy in her 72s.    Past Medical History:  Diagnosis Date  . Allergic rhinitis   . Anemia   . Arthritis   . Bladder disorder    "lazy" , caths twice a day  . Fatty liver   . GERD (gastroesophageal reflux disease)   . Hypertension   . Seasonal allergies   . Systemic lupus erythematosus ([place])   . UTI (urinary tract infection)   . Vitamin D deficiency  Past Surgical History:  Procedure Laterality Date  . ABDOMINAL HYSTERECTOMY    . ROTATOR CUFF REPAIR Right 2012  . TONSILLECTOMY  1965    Prior to Admission medications   Medication Sig Start Date End Date Taking? Authorizing Provider  chlorhexidine (PERIDEX) 0.12 % solution RINSE WITH 1 CAPFUL ONCE DAILY AND EXPECTORATE. DO NOT RINSE WITH WATER 08/14/16   [provider]  cholecalciferol (VITAMIN D) 1000 UNITS tablet Take 2,000 Units by mouth daily.    [provider]  hydroxychloroquine (PLAQUENIL) 200 MG tablet TAKE ONE TABLET BY MOUTH TWICE A DAY MONDAY THROUGH FRIDAY. 10/22/18   Bo Merino, MD  Multiple Vitamins-Minerals (ONE-A-DAY ENERGY PO) Take by mouth.    [provider]   nitrofurantoin (MACRODANTIN) 50 MG capsule Take 50 mg by mouth once.    [provider]  nortriptyline (PAMELOR) 25 MG capsule TAKE 1 CAPSULE (25 MG TOTAL) BY MOUTH AT BEDTIME. 10/31/17   Jessy Oto, MD  Omega-3 Fatty Acids (FISH OIL) 500 MG CAPS Take 500 mg by mouth daily.    [provider]  triamterene-hydrochlorothiazide (MAXZIDE-25) 37.5-25 MG per tablet Take 1 tablet by mouth daily.    [provider]  valsartan (DIOVAN) 160 MG tablet Take 160 mg by mouth daily.    [provider]    Scheduled Meds: . enoxaparin (LOVENOX) injection  30 mg Subcutaneous Q24H  . hydroxychloroquine  200 mg Oral 2 times per day on Mon Tue Wed Thu Fri  . sodium chloride flush  3 mL Intravenous Q12H   Infusions: . lactated ringers 100 mL/hr at 10/30/18 0657   PRN Meds:    Allergies as of 10/29/2018  . (No Known Allergies)    Family History  Problem Relation Age of Onset  . Cancer Brother        unknown  . Kidney disease Son   . Diabetes Brother   . Heart disease Brother   . Colon cancer Neg Hx     Social History   Socioeconomic History  . Marital status: Married    Spouse name: Not on file  . Number of children: 2  . Years of education: Not on file  . Highest education level: Not on file  Occupational History  . Occupation: Retired [employer] Professor    Employer: RETIRED  Social Needs  . Financial resource strain: Not on file  . Food insecurity:    Worry: Not on file    Inability: Not on file  . Transportation needs:    Medical: Not on file    Non-medical: Not on file  Tobacco Use  . Smoking status: Former Smoker    Years: 22.00    Types: Cigarettes    Last attempt to quit: 03/21/2002    Years since quitting: 16.6  . Smokeless tobacco: Never Used  Substance and Sexual Activity  . Alcohol use: Yes    Comment: rare  . Drug use: No  . Sexual activity: Not on file  Lifestyle  . Physical activity:    Days per week: Not on file     Minutes per session: Not on file  . Stress: Not on file  Relationships  . Social connections:    Talks on phone: Not on file    Gets together: Not on file    Attends religious service: Not on file    Active member of club or organization: Not on file    Attends meetings of clubs or organizations: Not on file  Relationship status: Not on file  . Intimate partner violence:    Fear of current or ex partner: Not on file    Emotionally abused: Not on file    Physically abused: Not on file    Forced sexual activity: Not on file  Other Topics Concern  . Not on file  Social History Narrative  . Not on file    REVIEW OF SYSTEMS: Constitutional: Feels weak, tired. ENT:  No nose bleeds Pulm: Duct of cough. CV:  No palpitations, no LE edema.  No symptoms. GU: Dark-colored urine, oliguria. GI: Per HPI Heme: No excessive or unusual bleeding or bruising. Transfusions: None Neuro:  No headaches, no peripheral tingling or numbness.  No syncope. Derm: Pruritus.  No unusual lesions or nonhealing sores. Endocrine:  No sweats or chills.  No polyuria or dysuria Immunization: Had her flu shot this year.    PHYSICAL EXAM: Vital signs in last 24 hours: Vitals:   10/29/18 2121 10/30/18 0501  BP: 113/67 119/73  Pulse: 89 88  Resp: 18 20  Temp: (!) 97.4 F (36.3 C) 98.2 F (36.8 C)  SpO2: 94% 100%   Wt Readings from Last 3 Encounters:  09/01/18 90.1 kg  07/13/18 88.4 kg  12/11/17 88.5 kg    General: Pleasant, well-appearing, alert AAF. Head: No facial asymmetry or swelling.  No signs of head trauma. Eyes: Very slight scleral icterus, could easily be overlooked.  EOMI.  No conjunctival pallor. Ears: Not hard of hearing Nose: No congestion or discharge. Mouth: Tongue midline.  Oral mucosa moist, pink, clear.  Good dental repair. Neck: No JVD, no masses, no thyromegaly. Lungs: Clear bilaterally.  No labored breathing or cough. Heart: RRR.  No MRG.  S1, S2 present Abdomen: Soft.   Active bowel sounds.  Not distended.  Minimal if any tenderness.  She says she feels a little sore bilaterally but more so on the right.  I was unable to elicit tenderness from the exam..   Rectal: Deferred Musc/Skeltl: No joint redness or swelling. Extremities: No CCE. Neurologic: Alert.  Oriented x3.  Moves all 4 limbs with full strength.  No tremors.  No gross deficits. Skin: No obvious jaundice. Tattoos: None Nodes: No cervical adenopathy Psych: Calm, pleasant, cooperative, appropriate.  Good historian.  Intake/Output from previous day: 01/09 0701 - 01/10 0700 In: 1100 [IV Piggyback:1100] Out: -  Intake/Output this shift: No intake/output data recorded.  LAB RESULTS: Recent Labs    10/29/18 1025 10/29/18 1040 10/30/18 0228  WBC 6.4 6.8 6.3  HGB 12.3 13.0 10.2*  HCT 40.2 42.4 32.7*  PLT 341 309 264   BMET Lab Results  Component Value Date   NA 137 10/30/2018   NA 135 10/29/2018   NA 142 09/01/2018   K 3.8 10/30/2018   K 3.8 10/29/2018   K 3.8 09/01/2018   CL 105 10/30/2018   CL 100 10/29/2018   CL 105 09/01/2018   CO2 20 (L) 10/30/2018   CO2 23 10/29/2018   CO2 32 09/01/2018   GLUCOSE 72 10/30/2018   GLUCOSE 128 (H) 10/29/2018   GLUCOSE 94 09/01/2018   BUN 54 (H) 10/30/2018   BUN 56 (H) 10/29/2018   BUN 20 09/01/2018   CREATININE 2.26 (H) 10/30/2018   CREATININE 2.91 (H) 10/29/2018   CREATININE 0.97 (H) 09/01/2018   CALCIUM 8.4 (L) 10/30/2018   CALCIUM 8.7 (L) 10/29/2018   CALCIUM 9.1 09/01/2018   LFT Recent Labs    10/29/18 1025 10/30/18 0228  PROT 7.7  6.7  ALBUMIN 3.0* 2.8*  AST 217* 181*  ALT 312* 266*  ALKPHOS 763* 686*  BILITOT 3.3* 4.0*   Lipase     Component Value Date/Time   LIPASE 100 (H) 10/29/2018 1025     RADIOLOGY STUDIES: Ct Abdomen Pelvis Wo Contrast  Result Date: 10/29/2018 CLINICAL DATA:  Nausea, vomiting. EXAM: CT ABDOMEN AND PELVIS WITHOUT CONTRAST TECHNIQUE: Multidetector CT imaging of the abdomen and pelvis was  performed following the standard protocol without IV contrast. COMPARISON:  CT scan of February 17, 2006. FINDINGS: Lower chest: No acute abnormality. Hepatobiliary: No focal abnormality is seen in the liver on these unenhanced images. No gallstones are noted. However, dilated gallbladder is noted with severe dilatation of common bile duct without definite evidence of obstructing calculus. Pancreas: No definite inflammation is noted. Dilatation of pancreatic duct in head is noted of unknown etiology. Spleen: Normal in size without focal abnormality. Adrenals/Urinary Tract: Adrenal glands are unremarkable. Kidneys are normal, without renal calculi, focal lesion, or hydronephrosis. Bladder is unremarkable. Stomach/Bowel: Stomach is within normal limits. Appendix appears normal. No evidence of bowel wall thickening, distention, or inflammatory changes. Vascular/Lymphatic: Aortic atherosclerosis. No enlarged abdominal or pelvic lymph nodes. Reproductive: Status post hysterectomy. No adnexal masses. Other: No abdominal wall hernia or abnormality. No abdominopelvic ascites. Musculoskeletal: No acute or significant osseous findings. IMPRESSION: Gallbladder dilatation is now noted with severe dilatation of common bile duct without definite evidence of obstructing calculus. Some degree of pancreatic ductal dilatation is noted as well. Possible pancreatic mass can not be excluded. Further evaluation with MRCP is recommended to evaluate for possible cause of obstruction. Aortic Atherosclerosis (ICD10-I70.0). Electronically Signed   By: Marijo Conception, M.D.   On: 10/29/2018 16:32   US Abdomen Limited  Result Date: 10/29/2018 CLINICAL DATA:  Hepatitis with nausea and vomiting x5 days EXAM: ULTRASOUND ABDOMEN LIMITED RIGHT UPPER QUADRANT COMPARISON:  Eight 6 2009 renal ultrasound, CT 02/17/2006 FINDINGS: Gallbladder: The gallbladder is distended without stones. No wall thickening or pericholecystic fluid is noted. Single wall  thickness is 2.6 mm. Common bile duct: Diameter: Dilated to 12 mm without choledocholithiasis identified. No stricture nor mass is identified and there is no intrahepatic ductal dilatation suggesting that this may be ectatic as opposed to obstructive dilatation. Liver: No focal lesion identified. The left lobe was obscured by bowel gas. Normal echogenicity of the liver parenchyma. Portal vein is patent on color Doppler imaging with normal direction of blood flow towards the liver. IMPRESSION: 1. Homogeneous appearance of the liver without space-occupying mass. No sonographic evidence of cirrhosis. 2. Physiologic distention of the gallbladder without stones. 3. Ectatic/dilated common bile duct without calculus. Electronically Signed   By: Ashley Royalty M.D.   On: 10/29/2018 14:27     IMPRESSION:   *   Elevated LFTs, cholestatic pattern with increased alk phos. Dilated CBD, PD without obstructing stone or mass.  N/V for several days.   PT/INR not yet assayed.   Plaquenil linked to hepatic failure but not changes in bile ducts.  May have DILI from Turmeric but again this does not explain biliary dilatation.     *   AKI  *   ? UTI?  WBCs, RBCs, rare bacteria, moderate leukocytes in urine.  Single dose Rocephin yesterday.  cllx is pending.   No dysruria or other sxs to suggest UTI.     *   SLE.  On Plaquenil.  *    Anemia.  Normocytic today but slight microcytosis yesterday prior  to drop in Hgb.  Suspect the drop in hemoglobin I is due to volume expansion following.  Of dehydration.    PLAN:     *   MRI, MRCP.  Ordered stat.    *   PT/INR. ordered   *  ? Hold Plaquenil for time being?  *   Increase rate of LR to 200/hr for 8 hours, then to 125/hour.    *   CMET in AM.   Acute hepatitis panel is pending.      Azucena Freed  10/30/2018, 8:16 AM Phone [phone]  Addendum 1330:  MRI/MRCP:  1. Again seen is marked dilatation of the common bile duct and intrahepatic ducts. There is also  significant dilatation of the main pancreatic duct up to the level of the ampulla. No obstructing stone or mass identified. A small ampullary tumor or stricture can not be excluded. Further investigation with ERCP is recommended. 2. Borderline enlarged porta hepatic node measures 1.1 cm.  Plan: set up ERCP tomorrow.      [facility] Attending   I have taken an interval history, reviewed the chart and examined the patient. I agree with the Advanced Practitioner's note, impression and recommendations.   She has obstructive jaundice and a double duct sign but no sign of pancreatic tumor on imaging thus far. Ampullary lesion certainly possible. AKI noted - doubt connected   ERCP tomorrow The risks and benefits as well as alternatives of endoscopic procedure(s) have been discussed and reviewed. All questions answered. The patient agrees to proceed.  Gatha Mayer, MD, [facility] 10/30/2018 3:21 PM Pager [phone]

## 2018-10-30 ENCOUNTER — Inpatient Hospital Stay (HOSPITAL_COMMUNITY): Payer: Medicare Other

## 2018-10-30 DIAGNOSIS — E860 Dehydration: Secondary | ICD-10-CM

## 2018-10-30 DIAGNOSIS — R932 Abnormal findings on diagnostic imaging of liver and biliary tract: Secondary | ICD-10-CM

## 2018-10-30 DIAGNOSIS — N179 Acute kidney failure, unspecified: Secondary | ICD-10-CM

## 2018-10-30 DIAGNOSIS — I10 Essential (primary) hypertension: Secondary | ICD-10-CM

## 2018-10-30 DIAGNOSIS — Z79899 Other long term (current) drug therapy: Secondary | ICD-10-CM

## 2018-10-30 DIAGNOSIS — M329 Systemic lupus erythematosus, unspecified: Secondary | ICD-10-CM

## 2018-10-30 LAB — COMPREHENSIVE METABOLIC PANEL
ALT: 266 U/L — ABNORMAL HIGH (ref 0–44)
AST: 181 U/L — ABNORMAL HIGH (ref 15–41)
Albumin: 2.8 g/dL — ABNORMAL LOW (ref 3.5–5.0)
Alkaline Phosphatase: 686 U/L — ABNORMAL HIGH (ref 38–126)
Anion gap: 12 (ref 5–15)
BILIRUBIN TOTAL: 4 mg/dL — AB (ref 0.3–1.2)
BUN: 54 mg/dL — ABNORMAL HIGH (ref 8–23)
CO2: 20 mmol/L — ABNORMAL LOW (ref 22–32)
Calcium: 8.4 mg/dL — ABNORMAL LOW (ref 8.9–10.3)
Chloride: 105 mmol/L (ref 98–111)
Creatinine, Ser: 2.26 mg/dL — ABNORMAL HIGH (ref 0.44–1.00)
GFR calc Af Amer: 24 mL/min — ABNORMAL LOW (ref >60)
GFR, EST NON AFRICAN AMERICAN: 21 mL/min — AB (ref >60)
Glucose, Bld: 72 mg/dL (ref 70–99)
Potassium: 3.8 mmol/L (ref 3.5–5.1)
Sodium: 137 mmol/L (ref 135–145)
Total Protein: 6.7 g/dL (ref 6.5–8.1)

## 2018-10-30 LAB — CBC
HEMATOCRIT: 32.7 % — AB (ref 36.0–46.0)
HEMOGLOBIN: 10.2 g/dL — AB (ref 12.0–15.0)
MCH: 25.6 pg — ABNORMAL LOW (ref 26.0–34.0)
MCHC: 31.2 g/dL (ref 30.0–36.0)
MCV: 82 fL (ref 80.0–100.0)
Platelets: 264 10*3/uL (ref 150–400)
RBC: 3.99 MIL/uL (ref 3.87–5.11)
RDW: 16.7 % — ABNORMAL HIGH (ref 11.5–15.5)
WBC: 6.3 10*3/uL (ref 4.0–10.5)
nRBC: 0 % (ref 0.0–0.2)

## 2018-10-30 LAB — PROTIME-INR
INR: 1.12
PROTHROMBIN TIME: 14.3 s (ref 11.4–15.2)

## 2018-10-30 LAB — URINE CULTURE: Culture: NO GROWTH

## 2018-10-30 MED ORDER — LACTATED RINGERS IV SOLN
INTRAVENOUS | Status: AC
  Administered 2018-10-30: 19:00:00 via INTRAVENOUS

## 2018-10-30 MED ORDER — LACTATED RINGERS IV SOLN
INTRAVENOUS | Status: AC
  Administered 2018-10-30: 15:00:00 via INTRAVENOUS

## 2018-10-30 NOTE — Progress Notes (Signed)
Subjective: Kimberly Vazquez was seen and evaluated at bedside. No acute events overnight. No nausea or vomiting since admission. Her abdominal pain has significantly improved and only complains of some gurgling.   Objective:  Vital signs in last 24 hours: Vitals:   10/29/18 2121 10/30/18 0501 10/30/18 1514 10/30/18 1534  BP: 113/67 119/73 (!) 120/59   Pulse: 89 88 77   Resp: _0 Temp: (!) 97.4 F (36.3 C) 98.2 F (36.8 C)  (!) 97.5 F (36.4 C)  TempSrc: Oral Oral  Oral  SpO2: 94% 100%     General: awake, alert, lying comfortably in bed HEENT: no scleral icterus.  CV: RRR Abd: BS+; abdomen is soft, non-distended, non-tender Skin: no jaundice   Assessment/Plan:  Active Problems:   Choledocholithiasis   Biliary tract imaging abnormality   Dehydration  1. Obstructive jaundice:  - AST, ALT, alk phos trending down; Tbili has slightly increased from 3.3>4 - GI is following; MRCP today did not show evidence of choledocholithiasis  - will proceed with ERCP tomorrow to evaluate for ampullary or common bile duct pathology  - will continue supportive care with IVF, anti-emetics, and analgesics as needed  2. AKI - creatinine has improved with IVF indicating pre-renal component - She has no symptoms of rash or joint pain to indicate a Lupus flare. However, given her findings on U/A, will obtain dsDNA antibody and complement levels to evaluate for lupus nephritis - will obtain UPC as well   3. HTN - holding home blood pressure medications in the setting of dehydration and AKI  4. SLE - she will be off her Plaquenil over the weekend per usual schedule - will continue to trend liver function and resume Monday if labs have normalized    Dispo: Anticipated discharge pending further GI work-up.   Modena Nunnery D, DO 10/30/2018, 4:12 PM Pager: [phone]

## 2018-10-31 ENCOUNTER — Inpatient Hospital Stay (HOSPITAL_COMMUNITY): Payer: Medicare Other | Admitting: Certified Registered Nurse Anesthetist

## 2018-10-31 ENCOUNTER — Encounter (HOSPITAL_COMMUNITY): Payer: Self-pay | Admitting: Certified Registered Nurse Anesthetist

## 2018-10-31 ENCOUNTER — Inpatient Hospital Stay (HOSPITAL_COMMUNITY): Payer: Medicare Other

## 2018-10-31 ENCOUNTER — Encounter (HOSPITAL_COMMUNITY)
Admission: EM | Disposition: A | Discharge status: Home / Self Care | Payer: Self-pay | Source: Home / Self Care | Attending: Internal Medicine

## 2018-10-31 DIAGNOSIS — K831 Obstruction of bile duct: Secondary | ICD-10-CM

## 2018-10-31 DIAGNOSIS — K317 Polyp of stomach and duodenum: Secondary | ICD-10-CM

## 2018-10-31 HISTORY — PX: BIOPSY: SHX5522

## 2018-10-31 HISTORY — PX: ENDOSCOPIC RETROGRADE CHOLANGIOPANCREATOGRAPHY (ERCP) WITH PROPOFOL: SHX5810

## 2018-10-31 LAB — COMPREHENSIVE METABOLIC PANEL
ALT: 222 U/L — AB (ref 0–44)
AST: 135 U/L — AB (ref 15–41)
Albumin: 2.6 g/dL — ABNORMAL LOW (ref 3.5–5.0)
Alkaline Phosphatase: 607 U/L — ABNORMAL HIGH (ref 38–126)
Anion gap: 10 (ref 5–15)
BUN: 29 mg/dL — AB (ref 8–23)
CALCIUM: 8.7 mg/dL — AB (ref 8.9–10.3)
CO2: 22 mmol/L (ref 22–32)
CREATININE: 1.32 mg/dL — AB (ref 0.44–1.00)
Chloride: 107 mmol/L (ref 98–111)
GFR calc Af Amer: 47 mL/min — ABNORMAL LOW (ref >60)
GFR calc non Af Amer: 40 mL/min — ABNORMAL LOW (ref >60)
Glucose, Bld: 119 mg/dL — ABNORMAL HIGH (ref 70–99)
Potassium: 4.1 mmol/L (ref 3.5–5.1)
Sodium: 139 mmol/L (ref 135–145)
Total Bilirubin: 5.7 mg/dL — ABNORMAL HIGH (ref 0.3–1.2)
Total Protein: 6.4 g/dL — ABNORMAL LOW (ref 6.5–8.1)

## 2018-10-31 LAB — CBC
HCT: 34.2 % — ABNORMAL LOW (ref 36.0–46.0)
Hemoglobin: 10.8 g/dL — ABNORMAL LOW (ref 12.0–15.0)
MCH: 25.6 pg — ABNORMAL LOW (ref 26.0–34.0)
MCHC: 31.6 g/dL (ref 30.0–36.0)
MCV: 81 fL (ref 80.0–100.0)
PLATELETS: 226 10*3/uL (ref 150–400)
RBC: 4.22 MIL/uL (ref 3.87–5.11)
RDW: 16.6 % — ABNORMAL HIGH (ref 11.5–15.5)
WBC: 6.4 10*3/uL (ref 4.0–10.5)
nRBC: 0 % (ref 0.0–0.2)

## 2018-10-31 LAB — HEPATITIS PANEL, ACUTE
HCV Ab: 0.1 s/co ratio (ref 0.0–0.9)
HEP B C IGM: NEGATIVE
Hep A IgM: NEGATIVE
Hepatitis B Surface Ag: NEGATIVE

## 2018-10-31 SURGERY — ENDOSCOPIC RETROGRADE CHOLANGIOPANCREATOGRAPHY (ERCP) WITH PROPOFOL
Anesthesia: General

## 2018-10-31 MED ORDER — SUGAMMADEX SODIUM 200 MG/2ML IV SOLN
INTRAVENOUS | Status: DC | PRN
  Administered 2018-10-31: 332 mg via INTRAVENOUS

## 2018-10-31 MED ORDER — IOPAMIDOL (ISOVUE-300) INJECTION 61%
INTRAVENOUS | Status: DC | PRN
  Administered 2018-10-31: 10 mL via INTRAVENOUS

## 2018-10-31 MED ORDER — GLUCAGON HCL RDNA (DIAGNOSTIC) 1 MG IJ SOLR
INTRAMUSCULAR | Status: AC
  Filled 2018-10-31: qty 1

## 2018-10-31 MED ORDER — MIDAZOLAM HCL 2 MG/2ML IJ SOLN
INTRAMUSCULAR | Status: DC | PRN
  Administered 2018-10-31: 1 mg via INTRAVENOUS

## 2018-10-31 MED ORDER — ROCURONIUM BROMIDE 10 MG/ML (PF) SYRINGE
PREFILLED_SYRINGE | INTRAVENOUS | Status: DC | PRN
  Administered 2018-10-31: 40 mg via INTRAVENOUS

## 2018-10-31 MED ORDER — SODIUM CHLORIDE 0.9 % IV SOLN
3.0000 g | INTRAVENOUS | Status: AC
  Administered 2018-10-31: 3 g via INTRAVENOUS
  Filled 2018-10-31: qty 3

## 2018-10-31 MED ORDER — CISATRACURIUM BESYLATE 20 MG/10ML IV SOLN
INTRAVENOUS | Status: AC
  Filled 2018-10-31: qty 10

## 2018-10-31 MED ORDER — DEXAMETHASONE SODIUM PHOSPHATE 10 MG/ML IJ SOLN
INTRAMUSCULAR | Status: DC | PRN
  Administered 2018-10-31: 10 mg via INTRAVENOUS

## 2018-10-31 MED ORDER — INDOMETHACIN 50 MG RE SUPP
100.0000 mg | Freq: Once | RECTAL | Status: DC
  Filled 2018-10-31 (×2): qty 2

## 2018-10-31 MED ORDER — ONDANSETRON HCL 4 MG/2ML IJ SOLN
INTRAMUSCULAR | Status: DC | PRN
  Administered 2018-10-31: 4 mg via INTRAVENOUS

## 2018-10-31 MED ORDER — PROPOFOL 10 MG/ML IV BOLUS
INTRAVENOUS | Status: DC | PRN
  Administered 2018-10-31: 110 mg via INTRAVENOUS

## 2018-10-31 MED ORDER — INDOMETHACIN 50 MG RE SUPP
RECTAL | Status: AC
  Filled 2018-10-31: qty 2

## 2018-10-31 MED ORDER — LACTATED RINGERS IV SOLN
INTRAVENOUS | Status: DC | PRN
  Administered 2018-10-31: 07:00:00 via INTRAVENOUS

## 2018-10-31 MED ORDER — INDOMETHACIN 50 MG RE SUPP
RECTAL | Status: DC | PRN
  Administered 2018-10-31: 100 mg via RECTAL

## 2018-10-31 MED ORDER — ENOXAPARIN SODIUM 30 MG/0.3ML ~~LOC~~ SOLN: 30.0000 mg | SUBCUTANEOUS | Status: DC

## 2018-10-31 MED ORDER — DEXAMETHASONE SODIUM PHOSPHATE 10 MG/ML IJ SOLN: INTRAMUSCULAR | Status: DC | PRN

## 2018-10-31 MED ORDER — IOPAMIDOL (ISOVUE-300) INJECTION 61%
INTRAVENOUS | Status: AC
  Filled 2018-10-31: qty 50

## 2018-10-31 MED ORDER — LIDOCAINE 2% (20 MG/ML) 5 ML SYRINGE
INTRAMUSCULAR | Status: DC | PRN
  Administered 2018-10-31: 60 mg via INTRAVENOUS

## 2018-10-31 MED ORDER — FENTANYL CITRATE (PF) 250 MCG/5ML IJ SOLN
INTRAMUSCULAR | Status: DC | PRN
  Administered 2018-10-31: 100 ug via INTRAVENOUS

## 2018-10-31 NOTE — Progress Notes (Signed)
Pt returned to room 5N15 after procedure. Received report from [place], [place] in PACU. See assessment. Will continue to monitor.

## 2018-10-31 NOTE — Anesthesia Procedure Notes (Signed)
Procedure Name: Intubation Date/Time: 10/31/2018 7:55 AM Performed by: Clearnce Sorrel, CRNA Pre-anesthesia Checklist: Patient identified, Emergency Drugs available, Suction available, Patient being monitored and Timeout performed Patient Re-evaluated:Patient Re-evaluated prior to induction Oxygen Delivery Method: Circle system utilized Preoxygenation: Pre-oxygenation with 100% oxygen Induction Type: IV induction Ventilation: Mask ventilation without difficulty Laryngoscope Size: Mac and 3 Grade View: Grade I Tube type: Oral Tube size: 7.0 mm Number of attempts: 1 Airway Equipment and Method: Stylet Placement Confirmation: ETT inserted through vocal cords under direct vision,  positive ETCO2 and breath sounds checked- equal and bilateral Secured at: 22 cm Tube secured with: Tape Dental Injury: Teeth and Oropharynx as per pre-operative assessment

## 2018-10-31 NOTE — Progress Notes (Signed)
Care of pt assumed by MA Lygia Olaes RN 

## 2018-10-31 NOTE — Transfer of Care (Signed)
Immediate Anesthesia Transfer of Care Note  Patient: Kimberly Vazquez  Procedure(s) Performed: ENDOSCOPIC RETROGRADE CHOLANGIOPANCREATOGRAPHY (ERCP) WITH PROPOFOL (N/A ) BIOPSY  Patient Location: PACU  Anesthesia Type:General  Level of Consciousness: awake, alert  and oriented  Airway & Oxygen Therapy: Patient Spontanous Breathing and Patient connected to nasal cannula oxygen  Post-op Assessment: Report given to RN and Post -op Vital signs reviewed and stable  Post vital signs: Reviewed and stable  Last Vitals:  Vitals Value Taken Time  BP    Temp    Pulse    Resp    SpO2      Last Pain:  Vitals:   10/31/18 0706  TempSrc: Oral  PainSc: 0-No pain         Complications: No apparent anesthesia complications

## 2018-10-31 NOTE — Anesthesia Preprocedure Evaluation (Signed)
Anesthesia Evaluation  Patient identified by MRN, date of birth, ID band Patient awake    Reviewed: Allergy & Precautions, NPO status , Patient's Chart, lab work & pertinent test results  History of Anesthesia Complications Negative for: history of anesthetic complications  Airway Mallampati: III  TM Distance: >3 FB     Dental  (+) Teeth Intact, Caps   Pulmonary neg shortness of breath, neg sleep apnea, neg COPD, neg recent URI, former smoker,    breath sounds clear to auscultation       Cardiovascular hypertension, Pt. on medications  Rhythm:Regular     Neuro/Psych negative neurological ROS  negative psych ROS   GI/Hepatic Neg liver ROS, GERD  ,  Endo/Other    Renal/GU Renal InsufficiencyRenal disease     Musculoskeletal  (+) Arthritis ,   Abdominal   Peds  Hematology  (+) anemia ,   Anesthesia Other Findings   Reproductive/Obstetrics                             Anesthesia Physical Anesthesia Plan  ASA: III  Anesthesia Plan: General   Post-op Pain Management:    Induction: Intravenous  PONV Risk Score and Plan: 3 and Ondansetron and Dexamethasone  Airway Management Planned: Oral ETT  Additional Equipment: None  Intra-op Plan:   Post-operative Plan: Extubation in OR  Informed Consent: I have reviewed the patients History and Physical, chart, labs and discussed the procedure including the risks, benefits and alternatives for the proposed anesthesia with the patient or authorized representative who has indicated his/her understanding and acceptance.   Dental advisory given  Plan Discussed with: Surgeon and CRNA  Anesthesia Plan Comments:         Anesthesia Quick Evaluation

## 2018-10-31 NOTE — Plan of Care (Signed)
Problem: Education: Goal: Knowledge of General Education information will improve Description Including pain rating scale, medication(s)/side effects and non-pharmacologic comfort measures Outcome: Progressing   Problem: Health Behavior/Discharge Planning: Goal: Ability to manage health-related needs will improve Outcome: Progressing   Problem: Clinical Measurements: Goal: Respiratory complications will improve Outcome: Progressing   Problem: Coping: Goal: Level of anxiety will decrease Outcome: Progressing   Problem: Pain Managment: Goal: General experience of comfort will improve Outcome: Progressing   Problem: Safety: Goal: Ability to remain free from injury will improve Outcome: Progressing   Problem: Skin Integrity: Goal: Risk for impaired skin integrity will decrease Outcome: Progressing   

## 2018-10-31 NOTE — Interval H&P Note (Signed)
History and Physical Interval Note:  10/31/2018 7:27 AM  Kimberly Vazquez  has presented today for surgery, with the diagnosis of Dilated CBD and PD  The various methods of treatment have been discussed with the patient and family. After consideration of risks, benefits and other options for treatment, the patient has consented to  Procedure(s): ENDOSCOPIC RETROGRADE CHOLANGIOPANCREATOGRAPHY (ERCP) WITH PROPOFOL (N/A) as a surgical intervention .  The patient's history has been reviewed, patient examined, no change in status, stable for surgery.  I have reviewed the patient's chart and labs.  Questions were answered to the patient's satisfaction.     Silvano Rusk

## 2018-10-31 NOTE — Op Note (Signed)
[facility] Patient Name: Kimberly Vazquez Procedure Date : 10/31/2018 MRN: [id number] Attending MD: Gatha Mayer , MD Date of Birth: 04/06/1946 CSN: [id number] Age: 73 Admit Type: Inpatient Procedure:                ERCP Indications:              Abnormal MRCP, Jaundice Providers:                Gatha Mayer, MD, Elna Breslow, RN, William Dalton, Technician Referring MD:              Medicines:                General Anesthesia, Unasyn 3 g IV, Indomethacin 100                            mg PR (to reduce risk of post-ERCP pancreatitis) Complications:            No immediate complications. Estimated Blood Loss:     Estimated blood loss was minimal. Procedure:                Pre-Anesthesia Assessment:                           - Prior to the procedure, a History and Physical                            was performed, and patient medications and                            allergies were reviewed. The patient's tolerance of                            previous anesthesia was also reviewed. The risks                            and benefits of the procedure and the sedation                            options and risks were discussed with the patient.                            All questions were answered, and informed consent                            was obtained. Prior Anticoagulants: The patient has                            taken no previous anticoagulant or antiplatelet                            agents. ASA Grade Assessment: III - A patient with  severe systemic disease. After reviewing the risks                            and benefits, the patient was deemed in                            satisfactory condition to undergo the procedure.                           After obtaining informed consent, the scope was                            passed under direct vision. Throughout the   procedure, the patient's blood pressure, pulse, and                            oxygen saturations were monitored continuously. The                            [id number]-Q180V ([id number]) Olympus duodenoscope was                            introduced through the mouth, and used to inject                            contrast into and used to inject contrast into the                            ventral pancreatic duct. The ERCP was technically                            difficult and complex due to challenging                            cannulation. The patient tolerated the procedure                            well. Findings:      The scout film was normal. Esophagus not seen well but grossly normal.       Stomach looked normal. The D2 mucosa was involved with diffuse friable       polypoid changes extending a few cm involving at least the medial wall       and ampullary area though I could identify a papilla. I could cannulate       the papilla with a wire but pancreas only - partial injection of       contrast confirmed - deliberately did not fill out further. A wire was       left in the pancreas and attenpts to cannulate bile duct were again       unsuccessful. I stopped attempts at biliary cannulation and took       biopsies of the polypoid tissue changes. 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  Biopsied.                           This lesion is causing the biliary and pancreatic                            duct obstruction - I cannulated pancreatic duct and                            partial filling showed dilated duct as did MRCP                           Unable to cannulate bile duct Moderate Sedation:      Please see anesthesia notes, moderate sedation not given Recommendation:           - Return patient to hospital ward for ongoing care.                           - Clear liquids today                           Await pathology -  tissue I saw looks benign but I                            am concerned about duodenal cancer                           Will need additional work-up - I will discuss with                            colleagues but may need EUS and possibly another                            ERCP attempt vs surgical resection (may still need                            even if we do get a biliary stent)                           Would continue to pursue cause of AKI and given                            what she may need (major GI surgery) strongly                            consider renal consult Procedure Code(s):        --- Professional ---                           [phone], Endoscopic retrograde                            cholangiopancreatography (ERCP); with biopsy,  single or multiple Diagnosis Code(s):        --- Professional ---                           K31.7, Polyp of stomach and duodenum                           R17, Unspecified jaundice                           R93.2, Abnormal findings on diagnostic imaging of                            liver and biliary tract CPT copyright 2018 [employer]. All rights reserved. The codes documented in this report are preliminary and upon coder review may  be revised to meet current compliance requirements. Gatha Mayer, MD 10/31/2018 9:00:22 AM This report has been signed electronically. Number of Addenda: 0

## 2018-10-31 NOTE — Progress Notes (Signed)
   Subjective:  No acute events overnight. Ms. Dearman was tearful when evaluated this morning. She stated she is scared she could have cancer. She is otherwise feeling well and denies abdominal pain, N/V. She is also tolerating PO intake.   Objective:  Vital signs in last 24 hours: Vitals:   10/31/18 0911 10/31/18 0915 10/31/18 0926 10/31/18 0944  BP: (!) 141/70  137/69 139/80  Pulse: 74 75 69 68  Resp: 18 20 20 19   Temp:   (!) 97.1 F (36.2 C) 98.2 F (36.8 C)  TempSrc:    Oral  SpO2: 100% 100% 100%   Weight:      Height:       General: Elderly female, appears well, laying in bed in no acute distress Abd: Abdomen is soft, nontender nondistended, with normoactive bowel sounds  Assessment/Plan:  Principal Problem:   Biliary tract imaging abnormality Active Problems:   Dehydration   Obstructive jaundice  # Obstructive jaundice 2/2 duodenal polyp: Transaminases have trended down. However, T bili continues to trend up, 5.7 today. Patient underwent ERCP today.  Diffuse friable polypoid changes extending a few centimeters were observed along the D2 mucosa extending into the ampulla area. Procedure was technically difficult and they were not able to cannulate biliary tract, only the pancreatic duct. Biopsies were taken. Per GI, may need EUS and possible another ERCP attempt vs surgical resection.  - GI following, appreciate recommendations  - Follow up biopsy  - CL diet  - CMP in AM   # AKI: Likely secondary to dehydration as renal function continues to improve with IV fluids.  We will continue current management. Will follow up ds-DNA and complement level.   # HTN: Currently normotensive.  Holding blood pressure medications in the setting of hypotension on admission. May resume if patient becomes hypertensive.  # SLE: Off Plaquenil during the weekend as per home dose.  Will resume Plaquenil on Monday 1/13.   Dispo: Pending further GI work-up.  Welford Roche,  MD 10/31/2018, 12:10 PM Pager: [phone]

## 2018-11-01 ENCOUNTER — Encounter (HOSPITAL_COMMUNITY): Payer: Self-pay | Admitting: Internal Medicine

## 2018-11-01 DIAGNOSIS — K859 Acute pancreatitis without necrosis or infection, unspecified: Secondary | ICD-10-CM | POA: Diagnosis not present

## 2018-11-01 DIAGNOSIS — K9189 Other postprocedural complications and disorders of digestive system: Secondary | ICD-10-CM

## 2018-11-01 DIAGNOSIS — R1013 Epigastric pain: Secondary | ICD-10-CM

## 2018-11-01 LAB — COMPREHENSIVE METABOLIC PANEL
ALT: 221 U/L — ABNORMAL HIGH (ref 0–44)
AST: 103 U/L — AB (ref 15–41)
Albumin: 2.9 g/dL — ABNORMAL LOW (ref 3.5–5.0)
Alkaline Phosphatase: 604 U/L — ABNORMAL HIGH (ref 38–126)
Anion gap: 11 (ref 5–15)
BUN: 27 mg/dL — AB (ref 8–23)
CO2: 23 mmol/L (ref 22–32)
Calcium: 9 mg/dL (ref 8.9–10.3)
Chloride: 103 mmol/L (ref 98–111)
Creatinine, Ser: 1.24 mg/dL — ABNORMAL HIGH (ref 0.44–1.00)
GFR calc Af Amer: 50 mL/min — ABNORMAL LOW (ref >60)
GFR calc non Af Amer: 43 mL/min — ABNORMAL LOW (ref >60)
Glucose, Bld: 108 mg/dL — ABNORMAL HIGH (ref 70–99)
Potassium: 4 mmol/L (ref 3.5–5.1)
Sodium: 137 mmol/L (ref 135–145)
Total Bilirubin: 3.8 mg/dL — ABNORMAL HIGH (ref 0.3–1.2)
Total Protein: 7.1 g/dL (ref 6.5–8.1)

## 2018-11-01 LAB — CBC
HCT: 35.2 % — ABNORMAL LOW (ref 36.0–46.0)
Hemoglobin: 10.9 g/dL — ABNORMAL LOW (ref 12.0–15.0)
MCH: 24.6 pg — ABNORMAL LOW (ref 26.0–34.0)
MCHC: 31 g/dL (ref 30.0–36.0)
MCV: 79.5 fL — AB (ref 80.0–100.0)
PLATELETS: 255 10*3/uL (ref 150–400)
RBC: 4.43 MIL/uL (ref 3.87–5.11)
RDW: 16.6 % — ABNORMAL HIGH (ref 11.5–15.5)
WBC: 12.6 10*3/uL — ABNORMAL HIGH (ref 4.0–10.5)
nRBC: 0 % (ref 0.0–0.2)

## 2018-11-01 LAB — C3 COMPLEMENT: C3 Complement: 159 mg/dL (ref 82–167)

## 2018-11-01 LAB — AMYLASE: Amylase: 1214 U/L — ABNORMAL HIGH (ref 28–100)

## 2018-11-01 LAB — C4 COMPLEMENT: Complement C4, Body Fluid: 32 mg/dL (ref 14–44)

## 2018-11-01 LAB — LIPASE, BLOOD: Lipase: 1081 U/L — ABNORMAL HIGH (ref 11–51)

## 2018-11-01 MED ORDER — SODIUM CHLORIDE 0.9 % IV SOLN
8.0000 mg | Freq: Four times a day (QID) | INTRAVENOUS | Status: DC | PRN
  Administered 2018-11-05: 8 mg via INTRAVENOUS
  Filled 2018-11-01 (×2): qty 4

## 2018-11-01 MED ORDER — KCL-LACTATED RINGERS-D5W 20 MEQ/L IV SOLN
INTRAVENOUS | Status: DC
  Administered 2018-11-01 – 2018-11-04 (×8): via INTRAVENOUS
  Filled 2018-11-01 (×11): qty 1000

## 2018-11-01 MED ORDER — ONDANSETRON HCL 4 MG/2ML IJ SOLN: 8.0000 mg | Freq: Four times a day (QID) | INTRAMUSCULAR | Status: DC | PRN

## 2018-11-01 MED ORDER — ONDANSETRON HCL 4 MG/2ML IJ SOLN: 8.0000 mg | Freq: Four times a day (QID) | INTRAMUSCULAR | Status: DC

## 2018-11-01 MED ORDER — HYDROMORPHONE HCL 1 MG/ML IJ SOLN
0.5000 mg | INTRAMUSCULAR | Status: DC | PRN
  Administered 2018-11-01 – 2018-11-05 (×10): 0.5 mg via INTRAVENOUS
  Filled 2018-11-01 (×3): qty 1
  Filled 2018-11-01: qty 0.5
  Filled 2018-11-01 (×2): qty 1
  Filled 2018-11-01 (×2): qty 0.5
  Filled 2018-11-01 (×2): qty 1
  Filled 2018-11-01: qty 0.5
  Filled 2018-11-01: qty 1

## 2018-11-01 MED ORDER — IRBESARTAN 300 MG PO TABS
150.0000 mg | ORAL_TABLET | Freq: Every day | ORAL | Status: DC
  Administered 2018-11-01 – 2018-11-06 (×6): 150 mg via ORAL
  Filled 2018-11-01 (×6): qty 1

## 2018-11-01 MED ORDER — HYDROMORPHONE HCL 1 MG/ML IJ SOLN
0.7500 mg | Freq: Once | INTRAMUSCULAR | Status: AC
  Administered 2018-11-01: 0.75 mg via INTRAVENOUS
  Filled 2018-11-01: qty 1

## 2018-11-01 MED ORDER — LACTATED RINGERS IV BOLUS
1000.0000 mL | Freq: Once | INTRAVENOUS | Status: AC
  Administered 2018-11-01: 1000 mL via INTRAVENOUS

## 2018-11-01 NOTE — Plan of Care (Signed)
Problem: Education: Goal: Knowledge of General Education information will improve Description Including pain rating scale, medication(s)/side effects and non-pharmacologic comfort measures Outcome: Progressing   Problem: Health Behavior/Discharge Planning: Goal: Ability to manage health-related needs will improve Outcome: Progressing   Problem: Clinical Measurements: Goal: Respiratory complications will improve Outcome: Progressing   Problem: Coping: Goal: Level of anxiety will decrease Outcome: Progressing   Problem: Pain Managment: Goal: General experience of comfort will improve Outcome: Progressing   Problem: Safety: Goal: Ability to remain free from injury will improve Outcome: Progressing   Problem: Skin Integrity: Goal: Risk for impaired skin integrity will decrease Outcome: Progressing   

## 2018-11-01 NOTE — Progress Notes (Addendum)
   Patient Name: Kimberly Vazquez Date of Encounter: 11/01/2018, 10:43 AM    Subjective  Started having epigastric pain radiating to back and nausea and vomiting overnight. Current frequency of medications (hydromorphone) not adequate to control pain she says   Objective  BP (!) 156/83 (BP Location: Left Arm)   Pulse 85   Temp 98.4 F (36.9 C) (Oral)   Resp 18   Ht 5' 1.5" (1.562 m)   Wt 83 kg   SpO2 100%   BMI 34.02 kg/m  Mildly ill appearing, grimacing at times Lungs cta abd is soft, quiet and does not seem tender except to deep palpation  Lab Results  Component Value Date   ALT 221 (H) 11/01/2018   AST 103 (H) 11/01/2018   ALKPHOS 604 (H) 11/01/2018   BILITOT 3.8 (H) 11/01/2018   Lab Results  Component Value Date   WBC 12.6 (H) 11/01/2018   HGB 10.9 (L) 11/01/2018   HCT 35.2 (L) 11/01/2018   MCV 79.5 (L) 11/01/2018   PLT 255 11/01/2018   Lab Results  Component Value Date   CREATININE 1.24 (H) 11/01/2018   BUN 27 (H) 11/01/2018   NA 137 11/01/2018   K 4.0 11/01/2018   CL 103 11/01/2018   CO2 23 11/01/2018       Assessment and Plan  Duodenal polyp (vs cancer) causing ampullary obstruction - bilirubin decreased Acute abdominal pain and nausea and vomiting after ERCP - suspect pancreatitis - need labs to help understand AKI - better after hydration  NPO Change hydromorphone to q 4 Ondansetron increase to 8 mg q 6h prn IV Check lipase and amylase Hydrate with LR bolus and then D5LR + KCL running Await pathology Will f/u tomorrow, sooner prn    Gatha Mayer, MD, [facility] 11/01/2018 10:43 AM Pager [phone]

## 2018-11-01 NOTE — Progress Notes (Signed)
   Subjective:  No acute events overnight. Patient reports not doing well this AM. She is tearful and states "I dont know what's going on." Had abdominal pain and several episodes of emesis yesterday. Also endorses decreased appetite. We explained to patient she is here due to an obstruction caused by a polypoid tissue. We explained her renal function is improving and there are no signs of worsening obstruction in her labs.   Objective:  Vital signs in last 24 hours: Vitals:   10/31/18 0926 10/31/18 0944 10/31/18 1600 10/31/18 2203  BP: 137/69 139/80 128/85 (!) 154/64  Pulse: 69 68  66  Resp: 20 19 20 18   Temp: (!) 97.1 F (36.2 C) 98.2 F (36.8 C) 97.7 F (36.5 C) 98.5 F (36.9 C)  TempSrc:  Oral Oral Oral  SpO2: 100% 100% 99% 99%  Weight:      Height:       General: pleasant female, tearful, sitting up in bed in NAD Pulm: CTAB, no increased WOB Abd: soft, NTND, normoactive bowel sounds   Assessment/Plan:  Principal Problem:   Dilation of biliary tract Active Problems:   Biliary tract imaging abnormality   Dehydration   Obstructive jaundice  # Obstructive jaundice 2/2 duodenal polyp: Ms. Wolz is not feeling well today. She endorsed abdominal pain that is relieved with current pain regimen as well as N/V and poor appetite. She is also anxious about the possibility of malignancy. We reassured her. We explained that Dr. Carlean Purl with GI will see her today and explain further management/procedures.  - GI following, appreciate recommendations  - Follow up biopsy results  - Follow up lipase  - Dilaudid 0.5 mg q6h-->q4h PRN and IV zofran 4mg  q6h PRN   # AKI: Likely 2/2 dehydration as renal function continues to improve with IVF.  Will continue current management. Cannot r/u lupus nephritis. Awaiting results of dsDNA and complement levels. If indicative of lupus nephritis, will consult nephrology.   # HTN: Has been hypertensive today. Will resume home valsartan. Will continue  to monitor renal function given AKI.   # SLE: Continue home Plaquenil on M-F.   Dispo: Pending further GI work-up.  Welford Roche, MD 11/01/2018, 6:31 AM Pager: [phone]

## 2018-11-01 NOTE — Plan of Care (Signed)
  Problem: Pain Managment: Goal: General experience of comfort will improve Outcome: Progressing   Problem: Safety: Goal: Ability to remain free from injury will improve Outcome: Progressing   Problem: Skin Integrity: Goal: Risk for impaired skin integrity will decrease Outcome: Progressing   

## 2018-11-02 DIAGNOSIS — K859 Acute pancreatitis without necrosis or infection, unspecified: Secondary | ICD-10-CM

## 2018-11-02 DIAGNOSIS — K858 Other acute pancreatitis without necrosis or infection: Secondary | ICD-10-CM

## 2018-11-02 DIAGNOSIS — K838 Other specified diseases of biliary tract: Secondary | ICD-10-CM

## 2018-11-02 DIAGNOSIS — K9189 Other postprocedural complications and disorders of digestive system: Secondary | ICD-10-CM

## 2018-11-02 DIAGNOSIS — K831 Obstruction of bile duct: Secondary | ICD-10-CM

## 2018-11-02 LAB — CBC
HEMATOCRIT: 31.1 % — AB (ref 36.0–46.0)
Hemoglobin: 9.8 g/dL — ABNORMAL LOW (ref 12.0–15.0)
MCH: 25.3 pg — ABNORMAL LOW (ref 26.0–34.0)
MCHC: 31.5 g/dL (ref 30.0–36.0)
MCV: 80.4 fL (ref 80.0–100.0)
Platelets: 279 10*3/uL (ref 150–400)
RBC: 3.87 MIL/uL (ref 3.87–5.11)
RDW: 16.9 % — ABNORMAL HIGH (ref 11.5–15.5)
WBC: 14.7 10*3/uL — ABNORMAL HIGH (ref 4.0–10.5)
nRBC: 0 % (ref 0.0–0.2)

## 2018-11-02 LAB — COMPREHENSIVE METABOLIC PANEL
ALT: 142 U/L — ABNORMAL HIGH (ref 0–44)
AST: 48 U/L — ABNORMAL HIGH (ref 15–41)
Albumin: 2.4 g/dL — ABNORMAL LOW (ref 3.5–5.0)
Alkaline Phosphatase: 431 U/L — ABNORMAL HIGH (ref 38–126)
Anion gap: 12 (ref 5–15)
BILIRUBIN TOTAL: 4.2 mg/dL — AB (ref 0.3–1.2)
BUN: 19 mg/dL (ref 8–23)
CO2: 22 mmol/L (ref 22–32)
CREATININE: 1.07 mg/dL — AB (ref 0.44–1.00)
Calcium: 8.3 mg/dL — ABNORMAL LOW (ref 8.9–10.3)
Chloride: 104 mmol/L (ref 98–111)
GFR calc Af Amer: >60 mL/min (ref >60)
GFR calc non Af Amer: 52 mL/min — ABNORMAL LOW (ref >60)
Glucose, Bld: 119 mg/dL — ABNORMAL HIGH (ref 70–99)
Potassium: 4.1 mmol/L (ref 3.5–5.1)
Sodium: 138 mmol/L (ref 135–145)
TOTAL PROTEIN: 6.3 g/dL — AB (ref 6.5–8.1)

## 2018-11-02 LAB — ANTI-DNA ANTIBODY, DOUBLE-STRANDED: ds DNA Ab: <1 IU/mL (ref 0–9)

## 2018-11-02 LAB — PROTEIN / CREATININE RATIO, URINE
Creatinine, Urine: 178.42 mg/dL
Protein Creatinine Ratio: 0.38 mg/mg{Cre} — ABNORMAL HIGH (ref 0.00–0.15)
TOTAL PROTEIN, URINE: 67 mg/dL

## 2018-11-02 NOTE — Plan of Care (Signed)

## 2018-11-02 NOTE — Progress Notes (Signed)
      Progress Note   Subjective  Patient feeling better today, pain has been controlled with medications. No new issues overnight   Objective   Vital signs in last 24 hours: Temp:  [99.3 F (37.4 C)-99.7 F (37.6 C)] 99.3 F (37.4 C) (01/13 0436) Pulse Rate:  [94-107] 107 (01/13 0436) Resp:  [16-20] 18 (01/13 0436) BP: (130-156)/(70-78) 142/78 (01/13 0436) SpO2:  [97 %-99 %] 97 % (01/13 0436) Last BM Date: 10/30/18 General:    AA female in NAD Heart:  Regular rate and rhythm; no murmurs Lungs: Respirations even and unlabored, lungs CTA bilaterally Abdomen:  Soft, mild epigastric TTP and nondistended.  Extremities:  Without edema. Neurologic:  Alert and oriented,  grossly normal neurologically. Psych:  Cooperative. Normal mood and affect.  Intake/Output from previous day: 01/12 0701 - 01/13 0700 In: 1536.4 [P.O.:120; I.V.:416.2; IV Piggyback:1000.2] Out: 1875 [Urine:1775; Emesis/NG output:100] Intake/Output this shift: No intake/output data recorded.  Lab Results: Recent Labs    10/31/18 0949 11/01/18 0650 11/02/18 0520  WBC 6.4 12.6* 14.7*  HGB 10.8* 10.9* 9.8*  HCT 34.2* 35.2* 31.1*  PLT 226 255 279   BMET Recent Labs    10/31/18 0949 11/01/18 0650 11/02/18 0520  NA 139 137 138  K 4.1 4.0 4.1  CL 107 103 104  CO2 22 23 22   GLUCOSE 119* 108* 119*  BUN 29* 27* 19  CREATININE 1.32* 1.24* 1.07*  CALCIUM 8.7* 9.0 8.3*   LFT Recent Labs    11/02/18 0520  PROT 6.3*  ALBUMIN 2.4*  AST 48*  ALT 142*  ALKPHOS 431*  BILITOT 4.2*   PT/INR No results for input(s): LABPROT, INR in the last 72 hours.  Studies/Results: No results found.     Assessment / Plan:   73 y/o female who presented with jaundice, found to have duodenal polypoid lesion on ERCP obstructing the papilla. Unfortunately this prohibited cannulation of the biliary tree and the patient developed post ERCP pancreatitis. Biopsies pending from sampling of this polypoid lesion.   Overall  she is clinically improved from the pancreatitis perspective, feeling more comfortable. I discussed the situation with the patient and family, in regards to whether or not another ERCP should be attempted at some point in time for internal decompression of the biliary tree versus IR guided percutaneous biliary drain. I spoke with Dr. Ardis Hughs of advanced endoscopy, he would like to discuss with the patient and consider repeat ERCP, will talk with them later today or tomorrow, will hold off on percutaneous drain for now. Hopefully pathology is back later today or tomorrow to clarify what this polypoid lesion is. They agreed with the plan.   [place] Cellar, MD [facility]

## 2018-11-02 NOTE — Plan of Care (Signed)
  Problem: Pain Managment: Goal: General experience of comfort will improve Outcome: Progressing   Problem: Safety: Goal: Ability to remain free from injury will improve Outcome: Progressing   Problem: Skin Integrity: Goal: Risk for impaired skin integrity will decrease Outcome: Progressing   

## 2018-11-02 NOTE — Progress Notes (Signed)
   Subjective: Kimberly Vazquez was seen and evaluated at bedside on morning rounds. She was resting comfortably. No abdominal pain, nausea or vomiting. Discussed that her labs are overall improving, and we are awaiting GI's plans for how they want to proceed with duodenal polyp found on ERCP. We also informed her that her pancreas became inflamed after ERCP which is why she had worsening abdominal pain yesterday.   Objective:  Vital signs in last 24 hours: Vitals:   11/01/18 0633 11/01/18 1353 11/01/18 2120 11/02/18 0436  BP: (!) 156/83 130/75 (!) 156/70 (!) 142/78  Pulse: 85 94  (!) 107  Resp: 18 16 20 18   Temp: 98.4 F (36.9 C) 99.3 F (37.4 C) 99.7 F (37.6 C) 99.3 F (37.4 C)  TempSrc: Oral Oral Oral Oral  SpO2: 100% 99%  97%  Weight:      Height:       General: awake, alert, pleasant female lying in bed in NAD CV: RRR; no murmurs, rubs or gallops Pulm: normal respiratory effort; lungs CTA bilaterally Abd: Soft, non-distended, non-tender  Assessment/Plan:  Principal Problem:   Dilation of biliary tract Active Problems:   Biliary tract imaging abnormality   Dehydration   Obstructive jaundice   Post-ERCP acute pancreatitis  1. Obstructive jaundice 2/2 duodenal polyp - LFTs continue to downtrend; Tbili stable at 4.2 - Biopsy results pending - GI following; considering best approach to address obstruction  2. Post-ERCP pancreatitis - lipase >1,000 - symptomatically improved today - continue supportive care with IVF, anti-emetics and analgesics   3. AKI - likely pre-renal in the setting of dehydration; renal function continues to improve with IVF - complement levels normal, making lupus nephritis much less likely; dsDNA antibody still pending  4. HTN - have resumed Valsartan given improvement in renal function - continuing to hold home [id number]-HCTZ   5. SLE: on home Plaquenil M-F  Dispo: Anticipated discharge pending further GI work-up and management.    Modena Nunnery D, DO 11/02/2018, 11:24 AM Pager: [phone]

## 2018-11-02 NOTE — Progress Notes (Signed)
I met with her and her daughter this afternoon.  Explained biliary decompression options. She decided to go ahead with PTC rather than retry ERCP.  Also discussed sample error that could lead to false negative biopsy.  Await final path from duodenal biopsies.  Will follow along.

## 2018-11-02 NOTE — Care Management Important Message (Signed)
Important Message  Patient Details  Name: Kimberly Vazquez MRN: [id number] Date of Birth: Oct 11, 1946   Medicare Important Message Given:  Yes    Orbie Pyo 11/02/2018, 4:15 PM

## 2018-11-03 ENCOUNTER — Inpatient Hospital Stay (HOSPITAL_COMMUNITY): Payer: Medicare Other

## 2018-11-03 ENCOUNTER — Encounter (HOSPITAL_COMMUNITY): Payer: Self-pay | Admitting: Physician Assistant

## 2018-11-03 HISTORY — PX: IR INT EXT BILIARY DRAIN WITH CHOLANGIOGRAM: IMG6044

## 2018-11-03 LAB — COMPREHENSIVE METABOLIC PANEL
ALT: 107 U/L — ABNORMAL HIGH (ref 0–44)
AST: 52 U/L — ABNORMAL HIGH (ref 15–41)
Albumin: 2 g/dL — ABNORMAL LOW (ref 3.5–5.0)
Alkaline Phosphatase: 354 U/L — ABNORMAL HIGH (ref 38–126)
Anion gap: 6 (ref 5–15)
BILIRUBIN TOTAL: 5.4 mg/dL — AB (ref 0.3–1.2)
BUN: 14 mg/dL (ref 8–23)
CO2: 26 mmol/L (ref 22–32)
Calcium: 8.5 mg/dL — ABNORMAL LOW (ref 8.9–10.3)
Chloride: 110 mmol/L (ref 98–111)
Creatinine, Ser: 1.06 mg/dL — ABNORMAL HIGH (ref 0.44–1.00)
GFR calc non Af Amer: 52 mL/min — ABNORMAL LOW (ref >60)
Glucose, Bld: 105 mg/dL — ABNORMAL HIGH (ref 70–99)
Potassium: 4.6 mmol/L (ref 3.5–5.1)
Sodium: 142 mmol/L (ref 135–145)
TOTAL PROTEIN: 5.5 g/dL — AB (ref 6.5–8.1)

## 2018-11-03 LAB — CBC
HEMATOCRIT: 29.5 % — AB (ref 36.0–46.0)
Hemoglobin: 9 g/dL — ABNORMAL LOW (ref 12.0–15.0)
MCH: 25.1 pg — ABNORMAL LOW (ref 26.0–34.0)
MCHC: 30.5 g/dL (ref 30.0–36.0)
MCV: 82.4 fL (ref 80.0–100.0)
Platelets: 222 10*3/uL (ref 150–400)
RBC: 3.58 MIL/uL — ABNORMAL LOW (ref 3.87–5.11)
RDW: 17.2 % — ABNORMAL HIGH (ref 11.5–15.5)
WBC: 8.2 10*3/uL (ref 4.0–10.5)
nRBC: 0 % (ref 0.0–0.2)

## 2018-11-03 MED ORDER — MIDAZOLAM HCL 2 MG/2ML IJ SOLN
INTRAMUSCULAR | Status: AC | PRN
  Administered 2018-11-03: 1 mg via INTRAVENOUS
  Administered 2018-11-03: 0.5 mg via INTRAVENOUS

## 2018-11-03 MED ORDER — CEFAZOLIN SODIUM-DEXTROSE 2-4 GM/100ML-% IV SOLN
INTRAVENOUS | Status: AC
  Filled 2018-11-03: qty 100

## 2018-11-03 MED ORDER — FENTANYL CITRATE (PF) 100 MCG/2ML IJ SOLN
INTRAMUSCULAR | Status: AC | PRN
  Administered 2018-11-03: 50 ug via INTRAVENOUS
  Administered 2018-11-03: 25 ug via INTRAVENOUS

## 2018-11-03 MED ORDER — LIDOCAINE HCL 1 % IJ SOLN
INTRAMUSCULAR | Status: AC | PRN
  Administered 2018-11-03: 10 mL

## 2018-11-03 MED ORDER — MIDAZOLAM HCL 2 MG/2ML IJ SOLN
INTRAMUSCULAR | Status: AC | PRN
  Administered 2018-11-03: 0.5 mg via INTRAVENOUS

## 2018-11-03 MED ORDER — MIDAZOLAM HCL 2 MG/2ML IJ SOLN
INTRAMUSCULAR | Status: AC
  Filled 2018-11-03: qty 2

## 2018-11-03 MED ORDER — HYDROCODONE-ACETAMINOPHEN 5-325 MG PO TABS
1.0000 | ORAL_TABLET | ORAL | Status: DC | PRN
  Administered 2018-11-03: 2 via ORAL
  Filled 2018-11-03: qty 2

## 2018-11-03 MED ORDER — FENTANYL CITRATE (PF) 100 MCG/2ML IJ SOLN
INTRAMUSCULAR | Status: AC
  Filled 2018-11-03: qty 2

## 2018-11-03 MED ORDER — LIDOCAINE HCL 1 % IJ SOLN
INTRAMUSCULAR | Status: AC
  Filled 2018-11-03: qty 20

## 2018-11-03 MED ORDER — IOPAMIDOL (ISOVUE-300) INJECTION 61%
INTRAVENOUS | Status: AC
  Filled 2018-11-03: qty 50

## 2018-11-03 MED ORDER — FENTANYL CITRATE (PF) 100 MCG/2ML IJ SOLN
INTRAMUSCULAR | Status: AC | PRN
  Administered 2018-11-03: 25 ug via INTRAVENOUS

## 2018-11-03 MED ORDER — TRIAMTERENE-HCTZ 37.5-25 MG PO TABS
1.0000 | ORAL_TABLET | Freq: Every day | ORAL | Status: DC
  Administered 2018-11-03 – 2018-11-06 (×4): 1 via ORAL
  Filled 2018-11-03 (×5): qty 1

## 2018-11-03 MED ORDER — SODIUM CHLORIDE 0.9% FLUSH
5.0000 mL | Freq: Three times a day (TID) | INTRAVENOUS | Status: DC
  Administered 2018-11-03 – 2018-11-06 (×7): 5 mL

## 2018-11-03 MED ORDER — FLUTICASONE PROPIONATE 50 MCG/ACT NA SUSP
1.0000 | Freq: Two times a day (BID) | NASAL | Status: DC
  Administered 2018-11-03 – 2018-11-06 (×7): 1 via NASAL
  Filled 2018-11-03: qty 16

## 2018-11-03 MED ORDER — CEFAZOLIN SODIUM-DEXTROSE 2-4 GM/100ML-% IV SOLN
2.0000 g | INTRAVENOUS | Status: AC
  Administered 2018-11-03: 2 g via INTRAVENOUS
  Filled 2018-11-03: qty 100

## 2018-11-03 NOTE — Consult Note (Signed)
Chief Complaint: Patient was seen in consultation today for biliary drain placement.  Referring Physician(s): Azucena Freed, PA-C  Supervising Physician: Arne Cleveland  Patient Status: [facility] - In-pt  History of Present Illness: Kimberly Vazquez is a 73 y.o. 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 Patient initially presented to [facility] ED on 10/29/18 with complaints of epigastric abdominal pain, nausea and vomiting which had worsened over the last week. Initial labs significant for AST 217, ALT 312, ALP 763,  t.bili 3.3, creatinine 2.91, lipase 100, CRP 5.9, sedimentation rate 40. CT abdomen/pelvis w/o contrast was performed which showed gallbladder dilatation with severe dilatation of CBD without definite evidence of obstructing calculus; some degree of pancreatic ductal dilatation; possible pancreatic mass cannot be excluded. GI was consulted and she was admitted for further evaluation and management.   MRCP was performed on 10/30/18 which showed marked dilatation of the CBD and intrahepatic ducts as well as significant dilatation of the main pancreatic duct up to the level of the ampulla; no obstructing stone or mass identified; borderline enlarged porta hepatic node measuring 1.1 cm. She then underwent ERCP on 10/31/18 with Dr. 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 Request has been made to IR for biliary drain placement.   Patient reports she feels ok, would like to be able to eat soon. Continues to have lower abdominal/pelvic pain that is not affected by position, urination or bowel movement. She is concerned about side effects from drain  placement.   Past Medical History:  Diagnosis Date  . Allergic rhinitis   . Anemia   . Arthritis   . Bladder disorder    "lazy" , caths twice a day  . Fatty liver   . GERD (gastroesophageal reflux disease)   . Hypertension   . Seasonal allergies   . Systemic lupus erythematosus ([place])   . UTI (urinary tract infection)   . Vitamin D deficiency     Past Surgical History:  Procedure Laterality Date  . ABDOMINAL HYSTERECTOMY    . BIOPSY  10/31/2018   Procedure: BIOPSY;  Surgeon: Gatha Mayer, MD;  Location: [place];  Service: Endoscopy;;  . ENDOSCOPIC RETROGRADE CHOLANGIOPANCREATOGRAPHY (ERCP) WITH PROPOFOL N/A 10/31/2018   Procedure: ENDOSCOPIC RETROGRADE CHOLANGIOPANCREATOGRAPHY (ERCP) WITH PROPOFOL;  Surgeon: Gatha Mayer, MD;  Location: [facility] ENDOSCOPY;  Service: Endoscopy;  Laterality: N/A;  . ROTATOR CUFF REPAIR Right 2012  . TONSILLECTOMY  1965    Allergies: Patient has no known allergies.  Medications: Prior to Admission medications   Medication Sig Start Date End Date Taking? Authorizing Provider  amoxicillin-clavulanate (AUGMENTIN) 400-57 MG/5ML suspension Take 10 mLs by mouth every 12 (twelve) hours.   Yes [provider]  budesonide-formoterol (SYMBICORT) 160-4.5 MCG/ACT inhaler Inhale 2 puffs into the lungs 2 (two) times daily as needed (cough/congestion).   Yes [provider]  cholecalciferol (VITAMIN D) 1000 UNITS tablet Take 1,000 Units by mouth daily.    Yes [provider]  Guaifenesin (MUCINEX MAXIMUM STRENGTH) 1200 MG TB12 Take 1,200 mg by mouth every 12 (twelve) hours.   Yes [provider]  hydroxychloroquine (PLAQUENIL) 200 MG tablet TAKE ONE TABLET BY MOUTH TWICE A DAY MONDAY THROUGH FRIDAY. Patient taking differently:  Take 200 mg by mouth See admin instructions. Take one tablet (200 mg) by mouth twice daily on Monday thru Friday (skip Saturday and Sunday) 10/22/18  Yes Deveshwar, Abel Presto, MD  Multiple Vitamin  (MULTIVITAMIN WITH MINERALS) TABS tablet Take 1 tablet by mouth daily.   Yes [provider]  naproxen sodium (ALEVE) 220 MG tablet Take 440 mg by mouth daily as needed (pain/headache).   Yes [provider]  nortriptyline (PAMELOR) 25 MG capsule TAKE 1 CAPSULE (25 MG TOTAL) BY MOUTH AT BEDTIME. Patient taking differently: Take 25 mg by mouth daily.  10/31/17  Yes Jessy Oto, MD  Omega-3 Fatty Acids (FISH OIL) 1000 MG CPDR Take 1,000 mg by mouth daily.    Yes [provider]  OVER THE COUNTER MEDICATION Take 1 capsule by mouth daily. FlameRX - proprietary blend of turmeric, resveratrol, B vitamins   Yes [provider]  Polyethyl Glycol-Propyl Glycol (SYSTANE OP) Place 1 drop into both eyes daily as needed (dry eyes).   Yes [provider]  tolnaftate (TINACTIN) 1 % cream Apply 1 application topically 2 (two) times daily.   Yes [provider]  triamterene-hydrochlorothiazide (MAXZIDE-25) 37.5-25 MG per tablet Take 1 tablet by mouth daily.   Yes [provider]  valsartan (DIOVAN) 160 MG tablet Take 160 mg by mouth at bedtime.    Yes [provider]  nitrofurantoin (MACRODANTIN) 50 MG capsule Take 50 mg by mouth daily.     [provider]     Family History  Problem Relation Age of Onset  . Cancer Brother        unknown  . Kidney disease Son   . Diabetes Brother   . Heart disease Brother   . Colon cancer Neg Hx     Social History   Socioeconomic History  . Marital status: Married    Spouse name: Not on file  . Number of children: 2  . Years of education: Not on file  . Highest education level: Not on file  Occupational History  . Occupation: Retired [employer] Professor    Employer: RETIRED  Social Needs  . Financial resource strain: Not on file  . Food insecurity:    Worry: Not on file    Inability: Not on file  . Transportation needs:    Medical: Not on file    Non-medical: Not on file    Tobacco Use  . Smoking status: Former Smoker    Years: 22.00    Types: Cigarettes    Last attempt to quit: 03/21/2002    Years since quitting: 16.6  . Smokeless tobacco: Never Used  Substance and Sexual Activity  . Alcohol use: Yes    Comment: rare  . Drug use: No  . Sexual activity: Not on file  Lifestyle  . Physical activity:    Days per week: Not on file    Minutes per session: Not on file  . Stress: Not on file  Relationships  . Social connections:    Talks on phone: Not on file    Gets together: Not on file    Attends religious service: Not on file    Active member of club or organization: Not on file    Attends meetings of clubs or organizations: Not on file    Relationship status: Not on file  Other Topics Concern  . Not on file  Social History Narrative  . Not on file     Review of Systems: A 12 point  ROS discussed and pertinent positives are indicated in the HPI above.  All other systems are negative.  Review of Systems  Constitutional: Positive for appetite change, fatigue and fever (Reported temperature of "99 something" this morning). Negative for chills.  Respiratory: Negative for cough and shortness of breath.   Cardiovascular: Negative for chest pain.  Gastrointestinal: Positive for abdominal pain (Lower). Negative for diarrhea, nausea and vomiting.  Genitourinary: Negative for hematuria.  Neurological: Negative for dizziness and syncope.  Psychiatric/Behavioral: Negative for confusion.    Vital Signs: BP (!) 112/50 (BP Location: Left Arm)   Pulse 60   Temp 98.5 F (36.9 C) (Oral)   Resp 18   Ht 5' 1.5" (1.562 m)   Wt 183 lb (83 kg)   SpO2 96%   BMI 34.02 kg/m   Physical Exam Vitals signs reviewed.  Constitutional:      General: She is not in acute distress.    Comments: Daughter at bedside. Patient up walking around room using bathroom.  HENT:     Head: Normocephalic.  Eyes:     General: No scleral icterus. Cardiovascular:     Rate and  Rhythm: Normal rate and regular rhythm.  Pulmonary:     Effort: Pulmonary effort is normal.     Breath sounds: Normal breath sounds.  Abdominal:     General: There is no distension.     Palpations: Abdomen is soft.     Tenderness: There is abdominal tenderness (Lower abdomen).  Skin:    General: Skin is warm and dry.     Coloration: Skin is not jaundiced.  Neurological:     Mental Status: She is alert and oriented to person, place, and time.  Psychiatric:        Mood and Affect: Mood normal.        Behavior: Behavior normal.        Thought Content: Thought content normal.        Judgment: Judgment normal.      MD Evaluation Airway: WNL Heart: WNL Abdomen: WNL Chest/ Lungs: WNL ASA  Classification: 3 Mallampati/Airway Score: Two   Imaging: Ct Abdomen Pelvis Wo Contrast  Result Date: 10/29/2018 CLINICAL DATA:  Nausea, vomiting. EXAM: CT ABDOMEN AND PELVIS WITHOUT CONTRAST TECHNIQUE: Multidetector CT imaging of the abdomen and pelvis was performed following the standard protocol without IV contrast. COMPARISON:  CT scan of February 17, 2006. FINDINGS: Lower chest: No acute abnormality. Hepatobiliary: No focal abnormality is seen in the liver on these unenhanced images. No gallstones are noted. However, dilated gallbladder is noted with severe dilatation of common bile duct without definite evidence of obstructing calculus. Pancreas: No definite inflammation is noted. Dilatation of pancreatic duct in head is noted of unknown etiology. Spleen: Normal in size without focal abnormality. Adrenals/Urinary Tract: Adrenal glands are unremarkable. Kidneys are normal, without renal calculi, focal lesion, or hydronephrosis. Bladder is unremarkable. Stomach/Bowel: Stomach is within normal limits. Appendix appears normal. No evidence of bowel wall thickening, distention, or inflammatory changes. Vascular/Lymphatic: Aortic atherosclerosis. No enlarged abdominal or pelvic lymph nodes. Reproductive:  Status post hysterectomy. No adnexal masses. Other: No abdominal wall hernia or abnormality. No abdominopelvic ascites. Musculoskeletal: No acute or significant osseous findings. IMPRESSION: Gallbladder dilatation is now noted with severe dilatation of common bile duct without definite evidence of obstructing calculus. Some degree of pancreatic ductal dilatation is noted as well. Possible pancreatic mass can not be excluded. Further evaluation with MRCP is recommended to evaluate for possible cause of obstruction. Aortic Atherosclerosis (  ICD10-I70.0). Electronically Signed   By: Marijo Conception, M.D.   On: 10/29/2018 16:32   Mr Abdomen Mrcp Wo Contrast  Result Date: 10/30/2018 CLINICAL DATA:  Abdominal pain. Nausea and vomiting. Dilated common bile duct and pancreatic ducts. EXAM: MRI ABDOMEN WITHOUT CONTRAST  (INCLUDING MRCP) TECHNIQUE: Multiplanar multisequence MR imaging of the abdomen was performed. Heavily T2-weighted images of the biliary and pancreatic ducts were obtained, and three-dimensional MRCP images were rendered by post processing. COMPARISON:  CT AP 10/29/2018 FINDINGS: Lower chest: No acute findings. Hepatobiliary: No focal liver abnormality identified. The gallbladder appears normal. No sludge or stones identified. Marked intrahepatic bile duct dilatation. The common bile duct measures 1.7 cm in maximum diameter. No choledocholithiasis or mass identified. Pancreas: There is diffuse main duct dilatation to the level of the ampulla. This measures up to 8 mm. Within the limitations of unenhanced technique no discrete mass is identified. No pancreatic inflammation or free fluid identified. Spleen:  Within normal limits in size and appearance. Adrenals/Urinary Tract: The adrenal glands are unremarkable. No kidney mass or hydronephrosis identified. Stomach/Bowel: Visualized portions within the abdomen are unremarkable. Vascular/Lymphatic: Upper abdominal porta hepatic node measures 1.1 cm, image  15/8. No abdominal aortic aneurysm demonstrated. Other:  No free fluid or fluid collections. Musculoskeletal: No suspicious bone lesions identified. IMPRESSION: 1. Again seen is marked dilatation of the common bile duct and intrahepatic ducts. There is also significant dilatation of the main pancreatic duct up to the level of the ampulla. No obstructing stone or mass identified. A small ampullary tumor or stricture can not be excluded. Further investigation with ERCP is recommended. 2. Borderline enlarged porta hepatic node measures 1.1 cm. Electronically Signed   By: Kerby Moors M.D.   On: 10/30/2018 11:59   Mr 3d Recon At Scanner  Result Date: 10/30/2018 CLINICAL DATA:  Abdominal pain. Nausea and vomiting. Dilated common bile duct and pancreatic ducts. EXAM: MRI ABDOMEN WITHOUT CONTRAST  (INCLUDING MRCP) TECHNIQUE: Multiplanar multisequence MR imaging of the abdomen was performed. Heavily T2-weighted images of the biliary and pancreatic ducts were obtained, and three-dimensional MRCP images were rendered by post processing. COMPARISON:  CT AP 10/29/2018 FINDINGS: Lower chest: No acute findings. Hepatobiliary: No focal liver abnormality identified. The gallbladder appears normal. No sludge or stones identified. Marked intrahepatic bile duct dilatation. The common bile duct measures 1.7 cm in maximum diameter. No choledocholithiasis or mass identified. Pancreas: There is diffuse main duct dilatation to the level of the ampulla. This measures up to 8 mm. Within the limitations of unenhanced technique no discrete mass is identified. No pancreatic inflammation or free fluid identified. Spleen:  Within normal limits in size and appearance. Adrenals/Urinary Tract: The adrenal glands are unremarkable. No kidney mass or hydronephrosis identified. Stomach/Bowel: Visualized portions within the abdomen are unremarkable. Vascular/Lymphatic: Upper abdominal porta hepatic node measures 1.1 cm, image 15/8. No abdominal  aortic aneurysm demonstrated. Other:  No free fluid or fluid collections. Musculoskeletal: No suspicious bone lesions identified. IMPRESSION: 1. Again seen is marked dilatation of the common bile duct and intrahepatic ducts. There is also significant dilatation of the main pancreatic duct up to the level of the ampulla. No obstructing stone or mass identified. A small ampullary tumor or stricture can not be excluded. Further investigation with ERCP is recommended. 2. Borderline enlarged porta hepatic node measures 1.1 cm. Electronically Signed   By: Kerby Moors M.D.   On: 10/30/2018 11:59   US Abdomen Limited  Result Date: 10/29/2018 CLINICAL DATA:  Hepatitis with  nausea and vomiting x5 days EXAM: ULTRASOUND ABDOMEN LIMITED RIGHT UPPER QUADRANT COMPARISON:  Eight 6 2009 renal ultrasound, CT 02/17/2006 FINDINGS: Gallbladder: The gallbladder is distended without stones. No wall thickening or pericholecystic fluid is noted. Single wall thickness is 2.6 mm. Common bile duct: Diameter: Dilated to 12 mm without choledocholithiasis identified. No stricture nor mass is identified and there is no intrahepatic ductal dilatation suggesting that this may be ectatic as opposed to obstructive dilatation. Liver: No focal lesion identified. The left lobe was obscured by bowel gas. Normal echogenicity of the liver parenchyma. Portal vein is patent on color Doppler imaging with normal direction of blood flow towards the liver. IMPRESSION: 1. Homogeneous appearance of the liver without space-occupying mass. No sonographic evidence of cirrhosis. 2. Physiologic distention of the gallbladder without stones. 3. Ectatic/dilated common bile duct without calculus. Electronically Signed   By: Ashley Royalty M.D.   On: 10/29/2018 14:27   Dg C-arm 1-60 Min-no Report  Result Date: 10/31/2018 Fluoroscopy was utilized by the requesting physician.  No radiographic interpretation.    Labs:  CBC: Recent Labs    10/31/18 0949  11/01/18 0650 11/02/18 0520 11/03/18 0427  WBC 6.4 12.6* 14.7* 8.2  HGB 10.8* 10.9* 9.8* 9.0*  HCT 34.2* 35.2* 31.1* 29.5*  PLT 226 255 279 222    COAGS: Recent Labs    10/30/18 0906  INR 1.12    BMP: Recent Labs    10/31/18 0949 11/01/18 0650 11/02/18 0520 11/03/18 0427  NA 139 137 138 142  K 4.1 4.0 4.1 4.6  CL 107 103 104 110  CO2 22 23 22 26   GLUCOSE 119* 108* 119* 105*  BUN 29* 27* 19 14  CALCIUM 8.7* 9.0 8.3* 8.5*  CREATININE 1.32* 1.24* 1.07* 1.06*  GFRNONAA 40* 43* 52* 52*  GFRAA 47* 50* >60 >60    LIVER FUNCTION TESTS: Recent Labs    10/31/18 0949 11/01/18 0650 11/02/18 0520 11/03/18 0427  BILITOT 5.7* 3.8* 4.2* 5.4*  AST 135* 103* 48* 52*  ALT 222* 221* 142* 107*  ALKPHOS 607* 604* 431* 354*  PROT 6.4* 7.1 6.3* 5.5*  ALBUMIN 2.6* 2.9* 2.4* 2.0*    TUMOR MARKERS: No results for input(s): AFPTM, CEA, CA199, CHROMGRNA in the last 8760 hours.  Assessment and Plan:  Patient admitted for obstructive jaundice, found to have duodenal mass obstructing biliary and pancreatic duct on ERCP. Pancreatic duct was able to be cannulated by GI, however bile duct was unable to be cannulated. Request has been made for biliary drain placement today in IR - patient reviewed by Dr. Vernard Gambles who approves procedure.   Patient has been NPO for 3 days except for small sips of water with medications, last dose of Lovenox 1/10. Afebrile, WBC 8.5, hgb 9.0, plt 222, AST 52, ALT 107, ALP 354, t.bili 5.4.  Risks and benefits of biliary drain palcement discussed with the patient including, but not limited to bleeding, infection which may lead to sepsis or even death and damage to adjacent structures.  This interventional procedure involves the use of X-rays and because of the nature of the planned procedure, it is possible that we will have prolonged use of X-ray fluoroscopy.  Potential radiation risks to you include (but are not limited to) the following: - A slightly  elevated risk for cancer  several years later in life. This risk is typically less than 0.5% percent. This risk is low in comparison to the normal incidence of human cancer, which is 33% for women  and 50% for men according to the [employer] [place]. - Radiation induced injury can include skin redness, resembling a rash, tissue breakdown / ulcers and hair loss (which can be temporary or permanent).   The likelihood of either of these occurring depends on the difficulty of the procedure and whether you are sensitive to radiation due to previous procedures, disease, or genetic conditions.   IF your procedure requires a prolonged use of radiation, you will be notified and given written instructions for further action.  It is your responsibility to monitor the irradiated area for the 2 weeks following the procedure and to notify your physician if you are concerned that you have suffered a radiation induced injury.    All of the patient's questions were answered, patient is agreeable to proceed.  Consent signed and in chart.  Thank you for this interesting consult.  I greatly enjoyed meeting Javanna Patin and look forward to participating in their care.  A copy of this report was sent to the requesting provider on this date.  Electronically Signed: Joaquim Nam, PA-C 11/03/2018, 11:04 AM   I spent a total of 40 Minutes  in face to face in clinical consultation, greater than 50% of which was counseling/coordinating care for biliary drain placement.

## 2018-11-03 NOTE — Plan of Care (Signed)
  Problem: Pain Managment: Goal: General experience of comfort will improve Outcome: Progressing   

## 2018-11-03 NOTE — Procedures (Signed)
  Procedure: Perc transhepatic cholangiogram,  perc int/ext biliary drain catheter 20f EBL:   minimal Complications:  none immediate  See full dictation in [facility].  Dillard Cannon MD Main # [phone] Pager  [phone]

## 2018-11-03 NOTE — Progress Notes (Signed)
Pt transferred from 5N to 5W room 17. A&O x4. Family at bedside. VS stable. Call bell within reach. All questions/concerns addressed. Will continue to monitor.

## 2018-11-03 NOTE — Progress Notes (Signed)
   Subjective: Kimberly Vazquez was seen and evaluated at bedside. No acute events overnight. She complains of some lower abdominal discomfort. No fevers, chills, nausea or vomiting. Has not had a bowel movement in a couple of days. Remains NPO. No urinary symptoms.    Objective:  Vital signs in last 24 hours: Vitals:   11/03/18 1425 11/03/18 1430 11/03/18 1435 11/03/18 1440  BP: (!) 179/86 (!) 191/114 (!) 197/112 (!) 178/92  Pulse: 97 97 98 90  Resp: 17 18 20 20  Temp:      TempSrc:      SpO2: 100% 100% 99% 97%  Weight:      Height:       General: awake, alert, pleasant female lying in bed in NAD; no jaundice CV: RRR; no murmurs, rubs or gallops Abd: BS+; abdomen is soft, non-distended; mild TTP in lower quadrants  Assessment/Plan:  Principal Problem:   Dilation of biliary tract Active Problems:   Biliary tract imaging abnormality   Dehydration   Obstructive jaundice   Post-ERCP acute pancreatitis  1. Obstructive jaundice 2/2 duodenal polyp - LFTs downtrending; Tbili increased to 5.4 - going for bilary decompression today with IR PTC drain - Biopsy results pending which will help determine long-term treatment plan - greatly appreciate GI following  2. Post-ERCP pancreatitis - patient's pain has resolved  - will advance diet following procedure   3. AKI - pre-renal in the setting of dehydration; renal function normalized with IVF   4. HTN - patient hypertensive; have resumed home Valsartan and triamterene-HCTZ given improvement in renal function  5. SLE: on home Plaquenil M-F  Dispo: Anticipated discharge pending biopsy results and plan for management.   [place], Carley D, DO 11/03/2018, 3:11 PM Pager: [phone]  

## 2018-11-03 NOTE — Progress Notes (Addendum)
          Daily Rounding Note  11/03/2018, 10:39 AM  LOS: 5 days   SUBJECTIVE:   Chief complaint: duodenal mass, causing biliary obstruction   Pt with some mild lower abd pain, no nausea.  Last BM was 2 or 3 d ago.  Has been NPO for 3 days.    OBJECTIVE:         Vital signs in last 24 hours:    Temp:  [98.1 F (36.7 C)-99.7 F (37.6 C)] 98.5 F (36.9 C) (01/14 0451) Pulse Rate:  [60-77] 60 (01/14 0451) Resp:  [18] 18 (01/14 0451) BP: (112-142)/(50-74) 112/50 (01/14 0451) SpO2:  [96 %-99 %] 96 % (01/14 0451) Last BM Date: 10/30/18 Filed Weights   10/31/18 0706  Weight: 83 kg   General: pleasant, alert, tired but not ill looking   Heart: RRR Chest: clear bil.  No SOB or cough Abdomen: soft, ND, minimal lower to mid abdominal tenderness  Extremities: no CCE Neuro/Psych:  Oriented x 3, fully alert and oriented.    Intake/Output from previous day: No intake/output data recorded.  Intake/Output this shift: No intake/output data recorded.  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 10/31/18 ERCP: Laterally spreding large duodenal polyp ? cancer. Biopsied. Lesion is causing the biliary and pancreatic duct obstruction - I cannulated pancreatic duct and partial filling showed dilated duct as did MRCP.  Unable  to cannulate bile duct.  LFTs improved.    *   Post ERCP pancreatitis.  Lipase 1081.  Pain nearly resolved.  No nausea but NPO for 2 to 3 days  *   AKI much improved.     PLAN   *   Dr Vernard Gambles reviewed imaging and is confident he can perform PTC, likely this afternoon  *   Nutrition needs to be addressed,  Now that pancreatitis is better? rechallenge with po? No evidence of GOO on the CT or MR.  No retained gastric contents at ERCP.  Keep her NPO for the PTC.    *   Lipase in AM.      Kimberly Vazquez  11/03/2018, 10:39 AM    ________________________________________________________________________  Kimberly Vazquez GI MD note:  I personally examined the patient, reviewed the data and agree with the assessment and plan described above.  Biliary decompression today with IR PTC drain. Await final path from duodenal neoplasm biopsies (it is at least large polyp, may harbor cancer).     Owens Loffler, MD [facility] Pager [phone]  Phone [phone]

## 2018-11-04 ENCOUNTER — Other Ambulatory Visit: Payer: Self-pay | Admitting: Physician Assistant

## 2018-11-04 DIAGNOSIS — D649 Anemia, unspecified: Secondary | ICD-10-CM

## 2018-11-04 DIAGNOSIS — K831 Obstruction of bile duct: Secondary | ICD-10-CM

## 2018-11-04 LAB — COMPREHENSIVE METABOLIC PANEL
ALT: 91 U/L — ABNORMAL HIGH (ref 0–44)
ANION GAP: 9 (ref 5–15)
AST: 45 U/L — ABNORMAL HIGH (ref 15–41)
Albumin: 2.1 g/dL — ABNORMAL LOW (ref 3.5–5.0)
Alkaline Phosphatase: 361 U/L — ABNORMAL HIGH (ref 38–126)
BUN: 11 mg/dL (ref 8–23)
CO2: 26 mmol/L (ref 22–32)
Calcium: 8.7 mg/dL — ABNORMAL LOW (ref 8.9–10.3)
Chloride: 105 mmol/L (ref 98–111)
Creatinine, Ser: 0.98 mg/dL (ref 0.44–1.00)
GFR calc Af Amer: >60 mL/min (ref >60)
GFR, EST NON AFRICAN AMERICAN: 58 mL/min — AB (ref >60)
Glucose, Bld: 105 mg/dL — ABNORMAL HIGH (ref 70–99)
Potassium: 4.4 mmol/L (ref 3.5–5.1)
Sodium: 140 mmol/L (ref 135–145)
Total Bilirubin: 2.5 mg/dL — ABNORMAL HIGH (ref 0.3–1.2)
Total Protein: 5.6 g/dL — ABNORMAL LOW (ref 6.5–8.1)

## 2018-11-04 LAB — GLUCOSE, CAPILLARY: Glucose-Capillary: 113 mg/dL — ABNORMAL HIGH (ref 70–99)

## 2018-11-04 LAB — CBC
HCT: 28.8 % — ABNORMAL LOW (ref 36.0–46.0)
Hemoglobin: 9 g/dL — ABNORMAL LOW (ref 12.0–15.0)
MCH: 25.6 pg — AB (ref 26.0–34.0)
MCHC: 31.3 g/dL (ref 30.0–36.0)
MCV: 82.1 fL (ref 80.0–100.0)
Platelets: 256 10*3/uL (ref 150–400)
RBC: 3.51 MIL/uL — ABNORMAL LOW (ref 3.87–5.11)
RDW: 16.9 % — ABNORMAL HIGH (ref 11.5–15.5)
WBC: 8.8 10*3/uL (ref 4.0–10.5)
nRBC: 0 % (ref 0.0–0.2)

## 2018-11-04 LAB — LIPASE, BLOOD: Lipase: 155 U/L — ABNORMAL HIGH (ref 11–51)

## 2018-11-04 MED ORDER — POLYETHYLENE GLYCOL 3350 17 G PO PACK: 17.0000 g | PACK | Freq: Every day | ORAL | Status: DC | PRN

## 2018-11-04 MED ORDER — SENNA 8.6 MG PO TABS: 2.0000 | ORAL_TABLET | Freq: Every day | ORAL | Status: DC | PRN

## 2018-11-04 MED ORDER — SODIUM CHLORIDE 0.9% FLUSH: 10.0000 mL | INTRAVENOUS | Status: DC | PRN

## 2018-11-04 MED ORDER — DEXTROSE-NACL 5-0.45 % IV SOLN
INTRAVENOUS | Status: DC
  Administered 2018-11-04 – 2018-11-06 (×5): via INTRAVENOUS

## 2018-11-04 MED ORDER — POLYETHYLENE GLYCOL 3350 17 G PO PACK
17.0000 g | PACK | Freq: Every day | ORAL | Status: DC
  Administered 2018-11-04 – 2018-11-06 (×3): 17 g via ORAL
  Filled 2018-11-04 (×3): qty 1

## 2018-11-04 MED ORDER — RAMELTEON 8 MG PO TABS
8.0000 mg | ORAL_TABLET | Freq: Every day | ORAL | Status: DC
  Administered 2018-11-04 – 2018-11-05 (×2): 8 mg via ORAL
  Filled 2018-11-04 (×2): qty 1

## 2018-11-04 MED ORDER — SODIUM CHLORIDE 0.9% FLUSH
10.0000 mL | Freq: Two times a day (BID) | INTRAVENOUS | Status: DC
  Administered 2018-11-04 – 2018-11-06 (×3): 10 mL

## 2018-11-04 NOTE — Progress Notes (Signed)
Referring Physician(s): Rande Brunt  Supervising Physician: Aletta Edouard  Patient Status:  [facility] - In-pt  Chief Complaint:  Jaundice, biliary obstruction,duodenal mass  Subjective: Pt resting quietly; denies worsening abd pain,N/V; some mild tenderness at biliary drain site   Allergies: Patient has no known allergies.  Medications: Prior to Admission medications   Medication Sig Start Date End Date Taking? Authorizing Provider  amoxicillin-clavulanate (AUGMENTIN) 400-57 MG/5ML suspension Take 10 mLs by mouth every 12 (twelve) hours.   Yes [provider]  budesonide-formoterol (SYMBICORT) 160-4.5 MCG/ACT inhaler Inhale 2 puffs into the lungs 2 (two) times daily as needed (cough/congestion).   Yes [provider]  cholecalciferol (VITAMIN D) 1000 UNITS tablet Take 1,000 Units by mouth daily.    Yes [provider]  Guaifenesin (MUCINEX MAXIMUM STRENGTH) 1200 MG TB12 Take 1,200 mg by mouth every 12 (twelve) hours.   Yes [provider]  hydroxychloroquine (PLAQUENIL) 200 MG tablet TAKE ONE TABLET BY MOUTH TWICE A DAY MONDAY THROUGH FRIDAY. Patient taking differently: Take 200 mg by mouth See admin instructions. Take one tablet (200 mg) by mouth twice daily on Monday thru Friday (skip Saturday and Sunday) 10/22/18  Yes Deveshwar, Abel Presto, MD  Multiple Vitamin (MULTIVITAMIN WITH MINERALS) TABS tablet Take 1 tablet by mouth daily.   Yes [provider]  naproxen sodium (ALEVE) 220 MG tablet Take 440 mg by mouth daily as needed (pain/headache).   Yes [provider]  nortriptyline (PAMELOR) 25 MG capsule TAKE 1 CAPSULE (25 MG TOTAL) BY MOUTH AT BEDTIME. Patient taking differently: Take 25 mg by mouth daily.  10/31/17  Yes Jessy Oto, MD  Omega-3 Fatty Acids (FISH OIL) 1000 MG CPDR Take 1,000 mg by mouth daily.    Yes [provider]  OVER THE COUNTER MEDICATION Take 1 capsule by mouth daily. FlameRX - proprietary blend of  turmeric, resveratrol, B vitamins   Yes [provider]  Polyethyl Glycol-Propyl Glycol (SYSTANE OP) Place 1 drop into both eyes daily as needed (dry eyes).   Yes [provider]  tolnaftate (TINACTIN) 1 % cream Apply 1 application topically 2 (two) times daily.   Yes [provider]  triamterene-hydrochlorothiazide (MAXZIDE-25) 37.5-25 MG per tablet Take 1 tablet by mouth daily.   Yes [provider]  valsartan (DIOVAN) 160 MG tablet Take 160 mg by mouth at bedtime.    Yes [provider]  nitrofurantoin (MACRODANTIN) 50 MG capsule Take 50 mg by mouth daily.     [provider]     Vital Signs: BP (!) 163/71 (BP Location: Right Arm)   Pulse 77   Temp 98.9 F (37.2 C) (Oral)   Resp 20   Ht 5' 1.5" (1.562 m)   Wt 201 lb 4.5 oz (91.3 kg)   SpO2 99%   BMI 37.42 kg/m   Physical Exam biliary drain intact, dressing dry, site mildly tender, output about 1.5 liters green bile  Imaging: Dg C-arm 1-60 Min-no Report  Result Date: 10/31/2018 Fluoroscopy was utilized by the requesting physician.  No radiographic interpretation.   Ir Int Lianne Cure Biliary Drain With Cholangiogram  Result Date: 11/03/2018 INDICATION: Biliary obstruction. Ampullary lesion precluded endoscopic decompression. EXAM: PERCUTANEOUS TRANSHEPATIC CHOLANGIOGRAM PERCUTANEOUS INTERNAL/EXTERNAL BILIARY DRAIN CATHETER PLACEMENT MEDICATIONS: Patient was already receiving adequate prophylactic antibiotic coverage as an inpatient. ANESTHESIA/SEDATION: Intravenous Fentanyl and Versed were administered as conscious sedation during continuous monitoring of the patient's level of consciousness and physiological / cardiorespiratory status by the radiology RN, with a total moderate  sedation time of 45 minutes. PROCEDURE: Informed written consent was obtained from the patient after a thorough discussion of the procedural risks, benefits and alternatives. All questions were addressed. Maximal  Sterile Barrier Technique was utilized including caps, mask, sterile gowns, sterile gloves, sterile drape, hand hygiene and skin antiseptic. A timeout was performed prior to the initiation of the procedure. Dilated intrahepatic biliary tree was identified on ultrasound and a peripheral left lower lobe duct was selected for approach. Skin and subcutaneous tissues down to the liver capsule infiltrated with 1% lidocaine. Under real-time ultrasound guidance, a 21 gauge micropuncture needle was advanced into the duct. A 018 guidewire advanced centrally. A 3 French micro dilator was placed. Small contrast injection under fluoroscopy confirmed appropriate positioning. Micro dilator exchanged over guidewire for a 5 French micropuncture dilator placed. Contrast injection for percutaneous transhepatic cholangiogram was performed. Catheter exchanged over an angled Glidewire for a 5 [place] Kumpe catheter, advanced to the distal CBD for additional cholangiographic images. The Kumpe was then advanced into the distal duodenum and exchanged over an Amplatz wire for vascular dilator which facilitated placement of a 10 French internal/external biliary drain catheter, placed with sideholes spanning the CBD obstruction, and holes in the distal duodenum. Confirmatory contrast injection under fluoroscopy performed. The catheter was then flushed with saline and secured externally with 0 Prolene suture and StatLock, placed to external drain bag to maximize biliary decompression. The patient tolerated the procedure well. FLUOROSCOPY TIME:  3.3 minutes, 818  uGym2 DAP COMPLICATIONS: None immediate. FINDINGS: The percutaneous transhepatic cholangiogram confirms dilated intrahepatic biliary tree and CBD, down to the ampulla. Duodenum decompressed. 10 French internal/external biliary drainage catheter placed as above. IMPRESSION: 1. Distal CBD obstruction with intrahepatic and extrahepatic biliary ductal dilatation. 2. Technically successful  internal/external biliary drain catheter placement. Electronically Signed   By: Lucrezia Europe M.D.   On: 11/03/2018 17:17    Labs:  CBC: Recent Labs    11/01/18 0650 11/02/18 0520 11/03/18 0427 11/04/18 0642  WBC 12.6* 14.7* 8.2 8.8  HGB 10.9* 9.8* 9.0* 9.0*  HCT 35.2* 31.1* 29.5* 28.8*  PLT 255 279 222 256    COAGS: Recent Labs    10/30/18 0906  INR 1.12    BMP: Recent Labs    11/01/18 0650 11/02/18 0520 11/03/18 0427 11/04/18 0642  NA 137 138 142 140  K 4.0 4.1 4.6 4.4  CL 103 104 110 105  CO2 23 22 26 26   GLUCOSE 108* 119* 105* 105*  BUN 27* 19 14 11   CALCIUM 9.0 8.3* 8.5* 8.7*  CREATININE 1.24* 1.07* 1.06* 0.98  GFRNONAA 43* 52* 52* 58*  GFRAA 50* >60 >60 >60    LIVER FUNCTION TESTS: Recent Labs    11/01/18 0650 11/02/18 0520 11/03/18 0427 11/04/18 0642  BILITOT 3.8* 4.2* 5.4* 2.5*  AST 103* 48* 52* 45*  ALT 221* 142* 107* 91*  ALKPHOS 604* 431* 354* 361*  PROT 7.1 6.3* 5.5* 5.6*  ALBUMIN 2.9* 2.4* 2.0* 2.1*    Assessment and Plan: Pt with hx duodenal mass, distal CBD obstruction; s/p I/E biliary drain 1/14; afebrile; WBC nl; hgb 9, creat nl; t bili 2.5(5.4), lipase down to 155, duodenal path pend; cont drain irrigation/hydration/monitor labs; send bile for culture/cytology; additional plans as per GI   Electronically Signed: D. Rowe Robert, PA-C 11/04/2018, 9:00 AM   I spent a total of 15 minutes at the the patient's bedside AND on the patient's hospital floor or unit, greater than 50% of which was counseling/coordinating care  for biliary drain    Patient ID: Kimberly Vazquez, female   DOB: [birth year], 73 y.o.   MRN: [id number]

## 2018-11-04 NOTE — Progress Notes (Signed)
   Subjective: Kimberly Vazquez seemed in good spirits this morning. She was smiling and laughing with the team. She is tolerating some PO intake. Her abdominal pain, N/V have all improved. We discussed the pathology findings with patient. Her daughter was at bedside. They verbalized understanding. All questions answered. We also encouraged them to speak to GI as they will help determine the next steps in management.   Objective:  Vital signs in last 24 hours: Vitals:   11/03/18 1558 11/03/18 2010 11/04/18 0636 11/04/18 1005  BP: (!) 156/77 (!) 145/76 (!) 163/71 (!) 169/76  Pulse: 78 73 77 76  Resp: 20 20 20    Temp: 98.3 F (36.8 C) 98.4 F (36.9 C) 98.9 F (37.2 C)   TempSrc: Oral Oral Oral   SpO2: 99% 100% 99%   Weight: 91.3 kg     Height: 5' 1.5" (1.562 m)      General: pleasant female lying in bed in NAD; no jaundice noted  CV: RRR, no mrg  Abd: abdomen is soft, PCT drain on LUQ with bilious output   Assessment/Plan:  Principal Problem:   Dilation of biliary tract Active Problems:   Biliary tract imaging abnormality   Dehydration   Obstructive jaundice   Post-ERCP acute pancreatitis  1. 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 TBili trending down. Her Po intake is also improving, though not eating much yet. We discussed pathology findings. She is aware this is a pre-cancerous lesion and that she will likely need surgery to remove it.  - Greatly appreciate GI following - Will need follow up at IR drain clinic - GI coordinating appt with Dr. Barry Dienes as an outpatient to discuss surgical options   2. Post-ERCP pancreatitis: Pain well-controlled. PO intake improving.    3. AKI 2/2 dehydration: Resolved   4. HTN: Hypertensive on home meds. Discontinued IVF as patient is eating.   5. SLE: on home Plaquenil M-F  Dispo: Anticipated discharge today- tomorrow.    Welford Roche, MD 11/04/2018, 2:04 PM Pager: [phone]

## 2018-11-04 NOTE — Progress Notes (Addendum)
Daily Rounding Note  11/04/2018, 10:52 AM  LOS: 6 days   SUBJECTIVE:   Chief complaint: obstructing duodenal mass, jaundice   Some discomfort at [facility] site.  No nausea, appetite fluctuates.  Tolerating solids.     No BM's for several days.   More lethargic today PTC drainage 1475 ml yesterday.    OBJECTIVE:         Vital signs in last 24 hours:    Temp:  [98 F (36.7 C)-98.9 F (37.2 C)] 98.9 F (37.2 C) (01/15 0636) Pulse Rate:  [71-98] 76 (01/15 1005) Resp:  [15-20] 20 (01/15 0636) BP: (145-197)/(71-114) 169/76 (01/15 1005) SpO2:  [95 %-100 %] 99 % (01/15 0636) Weight:  [91.3 kg] 91.3 kg (01/14 1558) Last BM Date: 10/30/18 Filed Weights   10/31/18 0706 11/03/18 1558  Weight: 83 kg 91.3 kg   General: less perky, looks tired.  Still jaundiced.   Heart: RRR Chest: clear bil.  No dyspnea or cough Abdomen: soft, tender on right in region of PTC drain.  Drain contains clear, dark bile  Extremities: no CCE Neuro/Psych:  Oriented x 3, alert but lethargic.    Intake/Output from previous day: 01/14 0701 - 01/15 0700 In: 520 [I.V.:505] Out: 1475 [Drains:1475]  Intake/Output this shift: No intake/output data recorded.  Lab Results: Recent Labs    11/02/18 0520 11/03/18 0427 11/04/18 0642  WBC 14.7* 8.2 8.8  HGB 9.8* 9.0* 9.0*  HCT 31.1* 29.5* 28.8*  PLT 279 222 256   BMET Recent Labs    11/02/18 0520 11/03/18 0427 11/04/18 0642  NA 138 142 140  K 4.1 4.6 4.4  CL 104 110 105  CO2 _0 GLUCOSE 119* 105* 105*  BUN _1 CREATININE 1.07* 1.06* 0.98  CALCIUM 8.3* 8.5* 8.7*   LFT Recent Labs    11/02/18 0520 11/03/18 0427 11/04/18 0642  PROT 6.3* 5.5* 5.6*  ALBUMIN 2.4* 2.0* 2.1*  AST 48* 52* 45*  ALT 142* 107* 91*  ALKPHOS 431* 354* 361*  BILITOT 4.2* 5.4* 2.5*   PT/INR No results for input(s): LABPROT, INR in the last 72 hours. Hepatitis Panel No results for input(s): HEPBSAG,  HCVAB, HEPAIGM, HEPBIGM in the last 72 hours.  Studies/Results: Ir Int Lianne Cure Biliary Drain With Cholangiogram  Result Date: 11/03/2018 INDICATION: Biliary obstruction. Ampullary lesion precluded endoscopic decompression. EXAM: PERCUTANEOUS TRANSHEPATIC CHOLANGIOGRAM PERCUTANEOUS INTERNAL/EXTERNAL BILIARY DRAIN CATHETER PLACEMENT MEDICATIONS: Patient was already receiving adequate prophylactic antibiotic coverage as an inpatient. ANESTHESIA/SEDATION: Intravenous Fentanyl and Versed were administered as conscious sedation during continuous monitoring of the patient's level of consciousness and physiological / cardiorespiratory status by the radiology RN, with a total moderate sedation time of 45 minutes. PROCEDURE: Informed written consent was obtained from the patient after a thorough discussion of the procedural risks, benefits and alternatives. All questions were addressed. Maximal Sterile Barrier Technique was utilized including caps, mask, sterile gowns, sterile gloves, sterile drape, hand hygiene and skin antiseptic. A timeout was performed prior to the initiation of the procedure. Dilated intrahepatic biliary tree was identified on ultrasound and a peripheral left lower lobe duct was selected for approach. Skin and subcutaneous tissues down to the liver capsule infiltrated with 1% lidocaine. Under real-time ultrasound guidance, a 21 gauge micropuncture needle was advanced into the duct. A 018 guidewire advanced centrally. A 3 French micro dilator was placed. Small contrast injection under fluoroscopy confirmed appropriate positioning. Micro dilator exchanged over guidewire for a 5  [place] micropuncture dilator placed. Contrast injection for percutaneous transhepatic cholangiogram was performed. Catheter exchanged over an angled Glidewire for a 5 [place] Kumpe catheter, advanced to the distal CBD for additional cholangiographic images. The Kumpe was then advanced into the distal duodenum and exchanged over an  Amplatz wire for vascular dilator which facilitated placement of a 10 French internal/external biliary drain catheter, placed with sideholes spanning the CBD obstruction, and holes in the distal duodenum. Confirmatory contrast injection under fluoroscopy performed. The catheter was then flushed with saline and secured externally with 0 Prolene suture and StatLock, placed to external drain bag to maximize biliary decompression. The patient tolerated the procedure well. FLUOROSCOPY TIME:  3.3 minutes, 633  uGym2 DAP COMPLICATIONS: None immediate. FINDINGS: The percutaneous transhepatic cholangiogram confirms dilated intrahepatic biliary tree and CBD, down to the ampulla. Duodenum decompressed. 10 French internal/external biliary drainage catheter placed as above. IMPRESSION: 1. Distal CBD obstruction with intrahepatic and extrahepatic biliary ductal dilatation. 2. Technically successful internal/external biliary drain catheter placement. Electronically Signed   By: Lucrezia Europe M.D.   On: 11/03/2018 17:17   Scheduled Meds: . fluticasone  1 spray Each Nare BID  . hydroxychloroquine  200 mg Oral 2 times per day on Mon Tue Wed Thu Fri  . irbesartan  150 mg Oral Daily  . sodium chloride flush  3 mL Intravenous Q12H  . sodium chloride flush  5 mL Intracatheter Q8H  . triamterene-hydrochlorothiazide  1 tablet Oral Daily   Continuous Infusions: . dextrose 5% lactated ringers with KCl 20 mEq/L 125 mL/hr at 11/04/18 0233  . 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 10/31/18 ERCP: Laterally spreding, large duodenal polyp.  Lesion causing biliary and pancreatic duct obstruction .  PD dilated, unable to cannulate CBD.    Path: TVA with high-grade dysplasia.  ? If polyp is also harboring  cancer? S/p 11/03/18 PTC.   LFTs continue improved.    *   Post ERCP pancreatitis.  Lipase 1081.  Pain nearly resolved.  No nausea but NPO for 2 to 3 days  *   Normocytic anemia.    *   AKI resolved.    *   Constipation.  MD ordered Miralax prn.     PLAN   *   PTC drain mgt per IR  *  Ok to discharge pt from Dr Ardis Hughs perspective.   But she needs appt with Dr Barry Dienes to discuss surgical option ([place]), IR fup to manage PTC drain and recheck LFTs (I will arrange) next week. I called surgical office and the appointment date they gave me was February 6 which is too far in the future.  Surgical office staff will check with Dr. Barry Dienes to see if she can open something up sooner, they will call the patient with the confirmed appointment time. Also placed orders in epic for c-Met and CBC next week.  *   Changed Miralax to 1 x daily.       Azucena Freed  11/04/2018, 10:52 AM Phone [phone]  ________________________________________________________________________  [name] GI MD note:  I personally examined the patient, reviewed the data and agree with the assessment and plan described above.  She vomited once about an hour ago. Feels better now.  OK to d/c tomorrow.    She will need follow up in IR drain clinic.  She will need LFTs next week (my office will arrange).   She will need appt with Dr. Barry Dienes (currently has 2/6 appt, her office is looking in to a sooner one).   Owens Loffler, MD [facility] Pager [phone]

## 2018-11-04 NOTE — Progress Notes (Signed)
Duodenal polyp lesion causing ampullary obstruction Path TV adenoma at least HGD She has been referred to Dr. Barry Dienes She has been informed by [facility] GI team at hospital  ccing primary GI MD dr. Fuller Plan

## 2018-11-05 DIAGNOSIS — Z978 Presence of other specified devices: Secondary | ICD-10-CM

## 2018-11-05 DIAGNOSIS — D367 Benign neoplasm of other specified sites: Secondary | ICD-10-CM

## 2018-11-05 DIAGNOSIS — N179 Acute kidney failure, unspecified: Secondary | ICD-10-CM

## 2018-11-05 LAB — COMPREHENSIVE METABOLIC PANEL
ALBUMIN: 2.1 g/dL — AB (ref 3.5–5.0)
ALT: 70 U/L — ABNORMAL HIGH (ref 0–44)
ANION GAP: 9 (ref 5–15)
AST: 28 U/L (ref 15–41)
Alkaline Phosphatase: 332 U/L — ABNORMAL HIGH (ref 38–126)
BUN: 7 mg/dL — ABNORMAL LOW (ref 8–23)
CO2: 27 mmol/L (ref 22–32)
Calcium: 9.1 mg/dL (ref 8.9–10.3)
Chloride: 101 mmol/L (ref 98–111)
Creatinine, Ser: 0.94 mg/dL (ref 0.44–1.00)
GFR calc Af Amer: >60 mL/min (ref >60)
GFR calc non Af Amer: >60 mL/min (ref >60)
GLUCOSE: 115 mg/dL — AB (ref 70–99)
Potassium: 3.8 mmol/L (ref 3.5–5.1)
Sodium: 137 mmol/L (ref 135–145)
Total Bilirubin: 2.1 mg/dL — ABNORMAL HIGH (ref 0.3–1.2)
Total Protein: 6.1 g/dL — ABNORMAL LOW (ref 6.5–8.1)

## 2018-11-05 LAB — CBC
HCT: 27.5 % — ABNORMAL LOW (ref 36.0–46.0)
Hemoglobin: 8.7 g/dL — ABNORMAL LOW (ref 12.0–15.0)
MCH: 25.5 pg — ABNORMAL LOW (ref 26.0–34.0)
MCHC: 31.6 g/dL (ref 30.0–36.0)
MCV: 80.6 fL (ref 80.0–100.0)
Platelets: 275 10*3/uL (ref 150–400)
RBC: 3.41 MIL/uL — ABNORMAL LOW (ref 3.87–5.11)
RDW: 16.6 % — ABNORMAL HIGH (ref 11.5–15.5)
WBC: 7.4 10*3/uL (ref 4.0–10.5)
nRBC: 0 % (ref 0.0–0.2)

## 2018-11-05 MED ORDER — WHITE PETROLATUM EX OINT
TOPICAL_OINTMENT | CUTANEOUS | Status: AC
  Administered 2018-11-05: 09:00:00
  Filled 2018-11-05: qty 28.35

## 2018-11-05 NOTE — Anesthesia Postprocedure Evaluation (Signed)
Anesthesia Post Note  Patient: Kimberly Vazquez  Procedure(s) Performed: ENDOSCOPIC RETROGRADE CHOLANGIOPANCREATOGRAPHY (ERCP) WITH PROPOFOL (N/A ) BIOPSY     Patient location during evaluation: Endoscopy Anesthesia Type: General Level of consciousness: awake and alert Pain management: pain level controlled Vital Signs Assessment: post-procedure vital signs reviewed and stable Respiratory status: spontaneous breathing, nonlabored ventilation, respiratory function stable and patient connected to nasal cannula oxygen Cardiovascular status: blood pressure returned to baseline and stable Postop Assessment: no apparent nausea or vomiting Anesthetic complications: no    Last Vitals:  Vitals:   11/04/18 2214 11/05/18 0628  BP: (!) 165/74 140/80  Pulse: 65 69  Resp: 18 18  Temp: 37.1 C 36.8 C  SpO2: 99% 93%    Last Pain:  Vitals:   11/05/18 0628  TempSrc: Oral  PainSc:                  [name]

## 2018-11-05 NOTE — Progress Notes (Signed)
   Subjective: Ms. Devera was seen and evaluated at bedside. Patient did not sleep well due to abdominal pain, but had significant improvement once she got some pain medication. She also vomited late yesterday afternoon and has not eaten much since then. No further episodes of emesis overnight or this morning. She has not had a BM for a few days. We discussed taking her diet slow today and should be able to go home tomorrow with close follow-up appointments with GI, IR, and general surgery.   Objective:  Vital signs in last 24 hours: Vitals:   11/04/18 1005 11/04/18 1300 11/04/18 2214 11/05/18 0628  BP: (!) 169/76 (!) 156/75 (!) 165/74 140/80  Pulse: 76 77 65 69  Resp:  20 18 18   Temp:  98.8 F (37.1 C) 98.7 F (37.1 C) 98.3 F (36.8 C)  TempSrc:  Oral Oral Oral  SpO2:  99% 99% 93%  Weight:      Height:       General: awake, alert, lying in bed in NAD CV: RRR; no murmurs, rubs or gallops Abd: BS+; abdomen is soft, non-distended, mildly TTP in lower quadrants  Biliary drain catheter with 4.2L output thus far   Assessment/Plan:  Principal Problem:   Dilation of biliary tract Active Problems:   Biliary tract imaging abnormality   Dehydration   Obstructive jaundice   Post-ERCP acute pancreatitis  1. 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 Barry Dienes next week to discuss best surgical option for removal  - she is overall improving, but still having some abdominal pain as well as n/v yesterday with little PO intake - continue with bowel regimen, suspect some of her pain is due to constipation given location - will monitor for another day as she advances diet and ensure she has all of her appointments scheduled for follow-up   2. Post-ERCP pancreatitis:  - slowly  advancing PO intake - epigastric pain has completely resolved   3. AKI 2/2 dehydration - resolved; renal function back at baseline  4. HTN:  - remains hypertensive on home regimen - will not make any inpatient adjustments, as it may be in the setting of pain and acute illness   5. SLE  - on home Plaquenil M-F  Dispo: Anticipated discharge home tomorrow.   Modena Nunnery D, DO 11/05/2018, 1:12 PM Pager: [phone]

## 2018-11-05 NOTE — Progress Notes (Signed)
Referring Physician(s): Dr Angelia Mould  Supervising Physician: Jacqulynn Cadet  Patient Status:  [facility] - In-pt  Chief Complaint:  Jaundice, biliary obstruction,duodenal mass 1/14: Technically successful internal/external biliary drain catheter placement.  Subjective:  Resting No complaints Weak Drain intact: OP bilious/cloudy 3.7 L yesterday; 500 cc today   Allergies: Patient has no known allergies.  Medications: Prior to Admission medications   Medication Sig Start Date End Date Taking? Authorizing Provider  amoxicillin-clavulanate (AUGMENTIN) 400-57 MG/5ML suspension Take 10 mLs by mouth every 12 (twelve) hours.   Yes [provider]  budesonide-formoterol (SYMBICORT) 160-4.5 MCG/ACT inhaler Inhale 2 puffs into the lungs 2 (two) times daily as needed (cough/congestion).   Yes [provider]  cholecalciferol (VITAMIN D) 1000 UNITS tablet Take 1,000 Units by mouth daily.    Yes [provider]  Guaifenesin (MUCINEX MAXIMUM STRENGTH) 1200 MG TB12 Take 1,200 mg by mouth every 12 (twelve) hours.   Yes [provider]  hydroxychloroquine (PLAQUENIL) 200 MG tablet TAKE ONE TABLET BY MOUTH TWICE A DAY MONDAY THROUGH FRIDAY. Patient taking differently: Take 200 mg by mouth See admin instructions. Take one tablet (200 mg) by mouth twice daily on Monday thru Friday (skip Saturday and Sunday) 10/22/18  Yes Deveshwar, Abel Presto, MD  Multiple Vitamin (MULTIVITAMIN WITH MINERALS) TABS tablet Take 1 tablet by mouth daily.   Yes [provider]  naproxen sodium (ALEVE) 220 MG tablet Take 440 mg by mouth daily as needed (pain/headache).   Yes [provider]  nortriptyline (PAMELOR) 25 MG capsule TAKE 1 CAPSULE (25 MG TOTAL) BY MOUTH AT BEDTIME. Patient taking differently: Take 25 mg by mouth daily.  10/31/17  Yes Jessy Oto, MD  Omega-3 Fatty Acids (FISH OIL) 1000 MG CPDR Take 1,000 mg by mouth daily.    Yes [provider]    OVER THE COUNTER MEDICATION Take 1 capsule by mouth daily. FlameRX - proprietary blend of turmeric, resveratrol, B vitamins   Yes [provider]  Polyethyl Glycol-Propyl Glycol (SYSTANE OP) Place 1 drop into both eyes daily as needed (dry eyes).   Yes [provider]  tolnaftate (TINACTIN) 1 % cream Apply 1 application topically 2 (two) times daily.   Yes [provider]  triamterene-hydrochlorothiazide (MAXZIDE-25) 37.5-25 MG per tablet Take 1 tablet by mouth daily.   Yes [provider]  valsartan (DIOVAN) 160 MG tablet Take 160 mg by mouth at bedtime.    Yes [provider]  nitrofurantoin (MACRODANTIN) 50 MG capsule Take 50 mg by mouth daily.     [provider]     Vital Signs: BP 140/80 (BP Location: Right Arm)   Pulse 69   Temp 98.3 F (36.8 C) (Oral)   Resp 18   Ht 5' 1.5" (1.562 m)   Wt 201 lb 4.5 oz (91.3 kg)   SpO2 93%   BMI 37.42 kg/m   Physical Exam Vitals signs reviewed.  Skin:    General: Skin is warm and dry.     Comments: Site of drain is clean and dry NT no bleeding OP bilious-- cloudy Great OP: 3 L yesterday; 500 cc today  TB 2.1 today  Neurological:     Mental Status: She is alert and oriented to person, place, and time.     Imaging: Ir Int Lianne Cure Biliary Drain With Cholangiogram  Result Date: 11/03/2018 INDICATION: Biliary obstruction. Ampullary lesion precluded endoscopic decompression. EXAM: PERCUTANEOUS TRANSHEPATIC CHOLANGIOGRAM PERCUTANEOUS INTERNAL/EXTERNAL BILIARY DRAIN CATHETER PLACEMENT MEDICATIONS: Patient was already  receiving adequate prophylactic antibiotic coverage as an inpatient. ANESTHESIA/SEDATION: Intravenous Fentanyl and Versed were administered as conscious sedation during continuous monitoring of the patient's level of consciousness and physiological / cardiorespiratory status by the radiology RN, with a total moderate sedation time of 45 minutes. PROCEDURE: Informed written consent  was obtained from the patient after a thorough discussion of the procedural risks, benefits and alternatives. All questions were addressed. Maximal Sterile Barrier Technique was utilized including caps, mask, sterile gowns, sterile gloves, sterile drape, hand hygiene and skin antiseptic. A timeout was performed prior to the initiation of the procedure. Dilated intrahepatic biliary tree was identified on ultrasound and a peripheral left lower lobe duct was selected for approach. Skin and subcutaneous tissues down to the liver capsule infiltrated with 1% lidocaine. Under real-time ultrasound guidance, a 21 gauge micropuncture needle was advanced into the duct. A 018 guidewire advanced centrally. A 3 French micro dilator was placed. Small contrast injection under fluoroscopy confirmed appropriate positioning. Micro dilator exchanged over guidewire for a 5 French micropuncture dilator placed. Contrast injection for percutaneous transhepatic cholangiogram was performed. Catheter exchanged over an angled Glidewire for a 5 [place] Kumpe catheter, advanced to the distal CBD for additional cholangiographic images. The Kumpe was then advanced into the distal duodenum and exchanged over an Amplatz wire for vascular dilator which facilitated placement of a 10 French internal/external biliary drain catheter, placed with sideholes spanning the CBD obstruction, and holes in the distal duodenum. Confirmatory contrast injection under fluoroscopy performed. The catheter was then flushed with saline and secured externally with 0 Prolene suture and StatLock, placed to external drain bag to maximize biliary decompression. The patient tolerated the procedure well. FLUOROSCOPY TIME:  3.3 minutes, 161  uGym2 DAP COMPLICATIONS: None immediate. FINDINGS: The percutaneous transhepatic cholangiogram confirms dilated intrahepatic biliary tree and CBD, down to the ampulla. Duodenum decompressed. 10 French internal/external biliary drainage  catheter placed as above. IMPRESSION: 1. Distal CBD obstruction with intrahepatic and extrahepatic biliary ductal dilatation. 2. Technically successful internal/external biliary drain catheter placement. Electronically Signed   By: Lucrezia Europe M.D.   On: 11/03/2018 17:17    Labs:  CBC: Recent Labs    11/02/18 0520 11/03/18 0427 11/04/18 0642 11/05/18 0348  WBC 14.7* 8.2 8.8 7.4  HGB 9.8* 9.0* 9.0* 8.7*  HCT 31.1* 29.5* 28.8* 27.5*  PLT 279 222 256 275    COAGS: Recent Labs    10/30/18 0906  INR 1.12    BMP: Recent Labs    11/02/18 0520 11/03/18 0427 11/04/18 0642 11/05/18 0348  NA 138 142 140 137  K 4.1 4.6 4.4 3.8  CL 104 110 105 101  CO2 22 26 26 27   GLUCOSE 119* 105* 105* 115*  BUN 19 14 11  7*  CALCIUM 8.3* 8.5* 8.7* 9.1  CREATININE 1.07* 1.06* 0.98 0.94  GFRNONAA 52* 52* 58* >60  GFRAA >60 >60 >60 >60    LIVER FUNCTION TESTS: Recent Labs    11/02/18 0520 11/03/18 0427 11/04/18 0642 11/05/18 0348  BILITOT 4.2* 5.4* 2.5* 2.1*  AST 48* 52* 45* 28  ALT 142* 107* 91* 70*  ALKPHOS 431* 354* 361* 332*  PROT 6.3* 5.5* 5.6* 6.1*  ALBUMIN 2.4* 2.0* 2.1* 2.1*    Assessment and Plan:  Int/ext biliary drain placed 1/14 OP great TB down to 2.1 Will follow Plans per Dr Barry Dienes; Dr Fuller Plan   Electronically Signed: Lavonia Drafts, PA-C 11/05/2018, 11:48 AM   I spent a total of 15 Minutes at the the patient's  bedside AND on the patient's hospital floor or unit, greater than 50% of which was counseling/coordinating care for I/E Biliary drain

## 2018-11-06 DIAGNOSIS — E860 Dehydration: Secondary | ICD-10-CM

## 2018-11-06 DIAGNOSIS — D1330 Benign neoplasm of unspecified part of small intestine: Secondary | ICD-10-CM

## 2018-11-06 DIAGNOSIS — D132 Benign neoplasm of duodenum: Principal | ICD-10-CM

## 2018-11-06 LAB — COMPREHENSIVE METABOLIC PANEL
ALT: 63 U/L — AB (ref 0–44)
AST: 25 U/L (ref 15–41)
Albumin: 2.4 g/dL — ABNORMAL LOW (ref 3.5–5.0)
Alkaline Phosphatase: 322 U/L — ABNORMAL HIGH (ref 38–126)
Anion gap: 9 (ref 5–15)
BUN: 11 mg/dL (ref 8–23)
CO2: 27 mmol/L (ref 22–32)
Calcium: 9.1 mg/dL (ref 8.9–10.3)
Chloride: 100 mmol/L (ref 98–111)
Creatinine, Ser: 1.14 mg/dL — ABNORMAL HIGH (ref 0.44–1.00)
GFR calc non Af Amer: 48 mL/min — ABNORMAL LOW (ref >60)
GFR, EST AFRICAN AMERICAN: 56 mL/min — AB (ref >60)
Glucose, Bld: 122 mg/dL — ABNORMAL HIGH (ref 70–99)
Potassium: 3.6 mmol/L (ref 3.5–5.1)
Sodium: 136 mmol/L (ref 135–145)
Total Bilirubin: 1.9 mg/dL — ABNORMAL HIGH (ref 0.3–1.2)
Total Protein: 6.3 g/dL — ABNORMAL LOW (ref 6.5–8.1)

## 2018-11-06 MED ORDER — BOOST / RESOURCE BREEZE PO LIQD CUSTOM: 1.0000 | Freq: Three times a day (TID) | ORAL | Status: DC

## 2018-11-06 MED ORDER — ENSURE ENLIVE PO LIQD
237.0000 mL | Freq: Two times a day (BID) | ORAL | Status: DC
  Administered 2018-11-06: 237 mL via ORAL

## 2018-11-06 MED ORDER — ONDANSETRON 4 MG PO TBDP: 4.0000 mg | ORAL_TABLET | Freq: Three times a day (TID) | ORAL | 0 refills | Status: DC | PRN

## 2018-11-06 NOTE — Progress Notes (Signed)
Initial Nutrition Assessment  DOCUMENTATION CODES:   Obesity unspecified  INTERVENTION:   Boost Breeze po TID, each supplement provides 250 kcal and 9 grams of protein  Encourage po intake   NUTRITION DIAGNOSIS:   Inadequate oral intake related to vomiting, nausea as evidenced by per patient/family report.  GOAL:   Patient will meet greater than or equal to 90% of their needs  MONITOR:   PO intake, Supplement acceptance, Labs, Weight trends  REASON FOR ASSESSMENT:   Malnutrition Screening Tool    ASSESSMENT:   73 y.o female with PMH: Systemic lupus erythrematosus and HTN. Admitted with n/v related to obstructive jaundice, AKI and duodenal mass.   1/11 ERCP found duodenal polyp (cancer vs ampullary obstruction?) 1/14 Per transhepatic biliary drain catheter  PT alert and oriented when DI entered the room. Family at bedside. Pt reported that she normally is a good eater at home and eats three meals a day with eggs and bacon, a sandwich for lunch and they usually go out to eat for dinner. Since pt reports n/v, pt has not been eating well in the hospital because she is afraid it will come back up. Pt able to keep down some of breakfast this morning after taking an anti-nausea pill.   Pt reported that she hadnt been eating well PTA because she had the flu and then came into the hospital soon after. Pt reported that she has lost around 10-11# since christmas up until now. Per the weight chart, trends indicate pt weight has been trending upwards.  Pt awaiting discharge soon. Discussed with pt and family memebers importance of protein and calorie needs to maintain muscle mass and prevent weight loss at home.   Medications reviewed and include: Ensure  Miralax Maxzide D5 @ 100 ml/hr Zofran   Labs reviewed: Creatinine 1.14 (H)   NUTRITION - FOCUSED PHYSICAL EXAM:    Most Recent Value  Orbital Region  No depletion  Upper Arm Region  No depletion  Thoracic and Lumbar  Region  No depletion  Buccal Region  No depletion  Temple Region  No depletion  Clavicle Bone Region  No depletion  Clavicle and Acromion Bone Region  No depletion  Scapular Bone Region  No depletion  Dorsal Hand  Mild depletion  Patellar Region  No depletion  Anterior Thigh Region  No depletion  Posterior Calf Region  No depletion  Edema (RD Assessment)  None  Hair  Reviewed  Eyes  Reviewed  Mouth  Reviewed  Skin  Reviewed  Nails  Reviewed       Diet Order:   Diet Order            Diet - low sodium heart healthy        Diet Heart Room service appropriate? Yes; Fluid consistency: Thin  Diet effective now              EDUCATION NEEDS:   Not appropriate for education at this time  Skin:  Skin Assessment: Reviewed RN Assessment  Last BM:  Unknown  Height:   Ht Readings from Last 1 Encounters:  11/03/18 5' 1.5" (1.562 m)    Weight:   Wt Readings from Last 1 Encounters:  11/03/18 91.3 kg    Ideal Body Weight:  48.8 kg  BMI:  Body mass index is 37.42 kg/m.  Estimated Nutritional Needs:   Kcal:  1800-1900 kcal/ d  Protein:  100-110 g/d  Fluid:  >/= 1.8 L    Mauricia Area, MS, Dietetic  Intern Pager: [phone] After hours Pager: [phone]

## 2018-11-06 NOTE — Discharge Summary (Signed)
Name: Kimberly Vazquez MRN: [id number] DOB: Dec 31, 1945 73 y.o. PCP: Willey Blade, MD  Date of Admission: 10/29/2018 10:11 AM Date of Discharge: 11/06/2018 Attending Physician: Lucious Groves, DO   Discharge Diagnosis: 1. 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 Post-ERCP pancreatitis  3. AKI secondary to dehydration  Discharge Medications: Allergies as of 11/06/2018   No Known Allergies     Medication List    STOP taking these medications   amoxicillin-clavulanate 400-57 MG/5ML suspension Commonly known as:  AUGMENTIN   triamterene-hydrochlorothiazide 37.5-25 MG tablet Commonly known as:  MAXZIDE-25   valsartan 160 MG tablet Commonly known as:  DIOVAN     TAKE these medications   budesonide-formoterol 160-4.5 MCG/ACT inhaler Commonly known as:  SYMBICORT Inhale 2 puffs into the lungs 2 (two) times daily as needed (cough/congestion).   cholecalciferol 1000 units tablet Commonly known as:  VITAMIN D Take 1,000 Units by mouth daily.   Fish Oil 1000 MG Cpdr Take 1,000 mg by mouth daily.   hydroxychloroquine 200 MG tablet Commonly known as:  PLAQUENIL TAKE ONE TABLET BY MOUTH TWICE A DAY [place]. What changed:    how much to take  how to take this  when to take this  additional instructions   multivitamin with minerals Tabs tablet Take 1 tablet by mouth daily.   naproxen sodium 220 MG tablet Commonly known as:  ALEVE Take 440 mg by mouth daily as needed (pain/headache).   nitrofurantoin 50 MG capsule Commonly known as:  MACRODANTIN Take 50 mg by mouth daily.   nortriptyline 25 MG capsule Commonly known as:  PAMELOR TAKE 1 CAPSULE (25 MG TOTAL) BY MOUTH AT BEDTIME. What changed:  when to take this   ondansetron 4 MG disintegrating tablet Commonly known as:  ZOFRAN ODT Take 1 tablet (4 mg total) by mouth every 8 (eight) hours as needed for nausea or vomiting.   OVER THE  COUNTER MEDICATION Take 1 capsule by mouth daily. FlameRX - proprietary blend of turmeric, resveratrol, B vitamins   SYSTANE OP Place 1 drop into both eyes daily as needed (dry eyes).   tolnaftate 1 % cream Commonly known as:  TINACTIN Apply 1 application topically 2 (two) times daily as needed (foot fungus).       Disposition and follow-up:   [name] was discharged from [facility] in Stable condition.  At the hospital follow up visit please address:  1.  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 Ensure she has follow-up with IR for outpatient monitoring and when this should be removed. She also has appointments with general surgery and GI. Evaluate for abdominal pain and ensure she has been able to tolerate PO at home. Will need repeat labs to monitor and bilirubin.  AKI: Patient presented with creatinine of 2.9 from baseline of <1 due to dehydration. Renal function improved to with IVF. Crt at discharge 1.1. Will need repeat labs at follow-up visit.   2.  Labs / imaging needed at time of follow-up: CMP  3.  Pending labs/ test needing follow-up: none   Follow-up Appointments: Follow-up Information    Stark Klein, MD Follow up.   Specialty:  General Surgery Why:  Dr. Marlowe Aschoff office will call you with an appointment time.  If you do not hear from them by the middle of next week, call the office. Contact information: [facility]. Go on 11/10/2018.   Why:  Go to the lab in the basement for a lab draw on Tuesday or Wednesday next week. Contact information: [address] [place]  [phone]       Willey Blade, MD. Schedule an appointment as soon as possible for a visit in 1 week(s).   Specialty:  Internal Medicine Contact information: [address] [place]  [place] [address] [place] [facility] Course by problem list: 1. 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 presented with progressive abdominal pain, n/v x5 days. Found to have obstructive jaundice. U/S revealed distended gallbladder and dilated CBD. MRCP was obtained which did not reveal evidence of choledocholithiasis. GI proceeded with ERCP the following day which showed a duodenal polyp. 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 In order to relieve biliary obstruction in the short-term, she had percutaneous biliary drain placed by IR with immediate improvement in Tbili. She was referred to Dr. Barry Dienes with general surgery to discuss surgical options. Patient had large amount of drain output which was discussed with IR and said to be expected with this type of drain. Once her pain was well controlled and she was tolerating PO, she was discharged with plans for outpatient follow-up with GI, surgery and IR.   2. Post-ERCP pancreatitis - patient had complication of pancreatitis after ERCP with lipase >1000, increased abdominal pain and nausea. Her symptoms resolved the following day with continued supportive care with IVF, anti-emetics and analgesics. Lipase returned to normal on re-check the following day.   3. AKI secondary to dehydration: Patient presented with creatinine of 2.9 from baseline of <1 due to dehydration. Given history of SLE, complement and anti-dsDNA antibodies were obtained to rule out lupus nephritis which were normal. Her AKI was secondary to dehydration from 5 days of n/v and decreased PO intake and returned to normal with IVF. Creatinine at discharge was 1.1. Due to her drain output, she was encouraged to drink plenty of fluids. She was also given sublingual zofran to minimize nausea.    Discharge Vitals:   BP 120/81 (BP Location: Right Arm)   Pulse 83   Temp 98.4 F (36.9 C)  (Oral)   Resp 16   Ht 5' 1.5" (1.562 m)   Wt 91.3 kg   SpO2 96%   BMI 37.42 kg/m   Pertinent Labs, Studies, and Procedures:  ERCP findings: Laterally spreding large duodenal polyp ? cancer. Biopsied. This lesion is causing the biliary and pancreatic duct obstruction - I cannulated pancreatic duct and partial filling showed dilated duct as did MRCP Unable to cannulate bile duct    Discharge Instructions: Discharge Instructions    Call MD for:  persistant nausea and vomiting   Complete by:  As directed    Call MD for:  redness, tenderness, or signs of infection (pain, swelling, redness, odor or green/yellow discharge around incision site)   Complete by:  As directed    Call MD for:  severe uncontrolled pain   Complete by:  As directed    Diet - low sodium heart healthy   Complete by:  As directed    Discharge instructions   Complete by:  As directed    Ms. Deluna,   You were admitted to the hospital due to abdominal pain that was caused by an obstruction  near your liver and gallbladder. This was caused by a pre-cancerous tissue in your small intestine. The intervention radiologist had to place a drain near this obstruction to relieve it. They will schedule you for follow-up here in the hospital to monitor the drain once you go home and will remove it when indicated. It is normal to have a high amount of output from this type of drain. Remember to drink plenty of fluids so you do not get dehydrated.   It will also be very important that you follow up with Dr. Barry Dienes (surgeon). She will be the one removing this tissue from your intestine. You also have an appointment with the gastroenterologist for labs in about 1 week.   We sent a prescription for nausea medicine to your pharmacy that you can take up to 3 times a day as needed. Please continue to advance your diet slowly at home.  I have held your blood pressure medications for now until you are tolerating a diet more consistently  and to have your kidneys checked at your follow-up appointment.   Please call us if you have any questions or concerns.  It was a pleasure taking care of you, and I wish you all the best with surgery and recovery!  - Dr. Koleen Distance   Increase activity slowly   Complete by:  As directed       Signed: Delice Bison, DO 11/09/2018, 11:36 AM   Pager: [phone]

## 2018-11-06 NOTE — Progress Notes (Signed)
   Subjective: Kimberly Vazquez was seen and evaluated at bedside. Patient had one episodes of emesis overnight. Nausea resolved after receiving Zofran. She is requesting sublingual Zofran for when she goes home. Her abdominal pain has resolved, and she tolerated some juice and graham crackers this morning. Instructed her to take it slow with her diet, but to make sure she is drinking plenty of fluids at home. All questions and concerns were addressed. She is stable for discharge home today.   Objective:  Vital signs in last 24 hours: Vitals:   11/05/18 0628 11/05/18 1459 11/05/18 2125 11/06/18 0555  BP: 140/80 (!) 171/86 (!) 145/80 120/81  Pulse: 69 64 78 83  Resp: 18 16    Temp: 98.3 F (36.8 C) 97.6 F (36.4 C) 99.3 F (37.4 C) 98.4 F (36.9 C)  TempSrc: Oral Oral Oral Oral  SpO2: 93% 100% 98% 96%  Weight:      Height:       General: awake, alert, sitting up in bed in NAD CV: RRR; no murmurs, rubs or gallops Abd: BS+; abdomen is soft, non-distended, non-tender  Assessment/Plan:  Principal Problem:   Dilation of biliary tract Active Problems:   Biliary tract imaging abnormality   Dehydration   Obstructive jaundice   Post-ERCP acute pancreatitis   AKI (acute kidney injury) ([place])  1. 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 Barry Dienes next week to discuss best surgical option for removal - also has follow-up IR for biliary drain, as well as GI to continue monitoring liver function   2. Post-ERCP pancreatitis:  - tolerating PO - lipase normalized - abdominal pain resolved    3. AKI 2/2 dehydration - renal  function mildly elevated from 0.9>1.1 today but remains stable - likely due to high drain output and limited PO intake due to nausea  - encouraged her to drink plenty of fluids when she is home and will discharge her with sublingual zofran to minimize nausea   4. HTN:  - given her renal function and limited PO intake, will hold blood pressure medications at discharge for them to be resumed once she is consistently tolerating PO and renal function remains stable   5. SLE  - on home Plaquenil M-F  Dispo: Anticipated discharge home today.   Modena Nunnery D, DO 11/06/2018, 9:58 AM Pager: [phone]

## 2018-11-07 ENCOUNTER — Encounter: Payer: Self-pay | Admitting: Internal Medicine

## 2018-11-07 LAB — BODY FLUID CULTURE
GRAM STAIN: NONE SEEN
Special Requests: NORMAL

## 2018-11-07 NOTE — Progress Notes (Addendum)
   Reason for call:   I received a call from [name] at 10:33 PM indicating that she has not been able to urinate for 1 week when she self-caths herself.    Pertinent Data:   Patient was recently hospitalized for obstructive jaundice 2/2 tubulovillous adenoma for which she has a biliary drain placed on 11/03/18. She was told to follow up with Dr. Barry Dienes.   On admission the patient's creatinine increased to 2.91 from baseline around 1.0. Triamterene-hctz were held during hospitalization and on discharge due to the patients dehydration and renal function.   She denies any fever or abdominal pain.  She has vomited one time today 11/07/18  She is feeling extremely weak  She self caths herself since 2006 due to "lazy bladder"/neurogenic bladder   The patient was not able to describe much about her symptoms and sounded extremely fatigued.   Assessment / Plan / Recommendations:   During the conversation the patient stated that she was getting another phone call and she hung up. I attempted to call the patient back and informed her that she should come to the emergency room to be evaluated for weakness, nausea, and vomiting.    Lars Mage, MD   11/07/2018, 10:33 PM

## 2018-11-08 ENCOUNTER — Other Ambulatory Visit: Payer: Self-pay

## 2018-11-08 ENCOUNTER — Encounter (HOSPITAL_COMMUNITY): Payer: Self-pay | Admitting: Emergency Medicine

## 2018-11-08 ENCOUNTER — Inpatient Hospital Stay (HOSPITAL_COMMUNITY)
Admission: EM | Admit: 2018-11-08 | Discharge: 2018-11-11 | DRG: 444 | Disposition: A | Discharge status: Home / Self Care | Payer: Medicare Other | Attending: Internal Medicine | Admitting: Internal Medicine

## 2018-11-08 ENCOUNTER — Emergency Department (HOSPITAL_COMMUNITY): Payer: Medicare Other

## 2018-11-08 ENCOUNTER — Inpatient Hospital Stay (HOSPITAL_COMMUNITY): Payer: Medicare Other

## 2018-11-08 DIAGNOSIS — I959 Hypotension, unspecified: Secondary | ICD-10-CM

## 2018-11-08 DIAGNOSIS — N319 Neuromuscular dysfunction of bladder, unspecified: Secondary | ICD-10-CM | POA: Diagnosis not present

## 2018-11-08 DIAGNOSIS — D132 Benign neoplasm of duodenum: Secondary | ICD-10-CM

## 2018-11-08 DIAGNOSIS — J45909 Unspecified asthma, uncomplicated: Secondary | ICD-10-CM | POA: Diagnosis present

## 2018-11-08 DIAGNOSIS — Z79899 Other long term (current) drug therapy: Secondary | ICD-10-CM | POA: Diagnosis not present

## 2018-11-08 DIAGNOSIS — M19041 Primary osteoarthritis, right hand: Secondary | ICD-10-CM | POA: Diagnosis present

## 2018-11-08 DIAGNOSIS — Z87891 Personal history of nicotine dependence: Secondary | ICD-10-CM | POA: Diagnosis not present

## 2018-11-08 DIAGNOSIS — Z7951 Long term (current) use of inhaled steroids: Secondary | ICD-10-CM | POA: Diagnosis not present

## 2018-11-08 DIAGNOSIS — I129 Hypertensive chronic kidney disease with stage 1 through stage 4 chronic kidney disease, or unspecified chronic kidney disease: Secondary | ICD-10-CM | POA: Diagnosis present

## 2018-11-08 DIAGNOSIS — E559 Vitamin D deficiency, unspecified: Secondary | ICD-10-CM | POA: Diagnosis present

## 2018-11-08 DIAGNOSIS — R112 Nausea with vomiting, unspecified: Secondary | ICD-10-CM

## 2018-11-08 DIAGNOSIS — K831 Obstruction of bile duct: Principal | ICD-10-CM | POA: Diagnosis present

## 2018-11-08 DIAGNOSIS — Z8049 Family history of malignant neoplasm of other genital organs: Secondary | ICD-10-CM

## 2018-11-08 DIAGNOSIS — E874 Mixed disorder of acid-base balance: Secondary | ICD-10-CM | POA: Diagnosis present

## 2018-11-08 DIAGNOSIS — E873 Alkalosis: Secondary | ICD-10-CM | POA: Diagnosis not present

## 2018-11-08 DIAGNOSIS — I10 Essential (primary) hypertension: Secondary | ICD-10-CM

## 2018-11-08 DIAGNOSIS — D1330 Benign neoplasm of unspecified part of small intestine: Secondary | ICD-10-CM | POA: Diagnosis present

## 2018-11-08 DIAGNOSIS — R34 Anuria and oliguria: Secondary | ICD-10-CM | POA: Diagnosis present

## 2018-11-08 DIAGNOSIS — Z833 Family history of diabetes mellitus: Secondary | ICD-10-CM | POA: Diagnosis not present

## 2018-11-08 DIAGNOSIS — K317 Polyp of stomach and duodenum: Secondary | ICD-10-CM | POA: Diagnosis present

## 2018-11-08 DIAGNOSIS — E43 Unspecified severe protein-calorie malnutrition: Secondary | ICD-10-CM

## 2018-11-08 DIAGNOSIS — A419 Sepsis, unspecified organism: Secondary | ICD-10-CM

## 2018-11-08 DIAGNOSIS — E861 Hypovolemia: Secondary | ICD-10-CM

## 2018-11-08 DIAGNOSIS — F329 Major depressive disorder, single episode, unspecified: Secondary | ICD-10-CM

## 2018-11-08 DIAGNOSIS — M329 Systemic lupus erythematosus, unspecified: Secondary | ICD-10-CM | POA: Diagnosis not present

## 2018-11-08 DIAGNOSIS — Z8249 Family history of ischemic heart disease and other diseases of the circulatory system: Secondary | ICD-10-CM

## 2018-11-08 DIAGNOSIS — K760 Fatty (change of) liver, not elsewhere classified: Secondary | ICD-10-CM | POA: Diagnosis present

## 2018-11-08 DIAGNOSIS — M19042 Primary osteoarthritis, left hand: Secondary | ICD-10-CM | POA: Diagnosis present

## 2018-11-08 DIAGNOSIS — Z90710 Acquired absence of both cervix and uterus: Secondary | ICD-10-CM | POA: Diagnosis not present

## 2018-11-08 DIAGNOSIS — N170 Acute kidney failure with tubular necrosis: Secondary | ICD-10-CM | POA: Diagnosis present

## 2018-11-08 DIAGNOSIS — N183 Chronic kidney disease, stage 3 (moderate): Secondary | ICD-10-CM | POA: Diagnosis present

## 2018-11-08 DIAGNOSIS — Z978 Presence of other specified devices: Secondary | ICD-10-CM | POA: Diagnosis not present

## 2018-11-08 DIAGNOSIS — E860 Dehydration: Secondary | ICD-10-CM | POA: Diagnosis not present

## 2018-11-08 DIAGNOSIS — F419 Anxiety disorder, unspecified: Secondary | ICD-10-CM

## 2018-11-08 DIAGNOSIS — K219 Gastro-esophageal reflux disease without esophagitis: Secondary | ICD-10-CM | POA: Diagnosis present

## 2018-11-08 DIAGNOSIS — R339 Retention of urine, unspecified: Secondary | ICD-10-CM

## 2018-11-08 DIAGNOSIS — Z934 Other artificial openings of gastrointestinal tract status: Secondary | ICD-10-CM | POA: Diagnosis not present

## 2018-11-08 DIAGNOSIS — R7989 Other specified abnormal findings of blood chemistry: Secondary | ICD-10-CM | POA: Diagnosis not present

## 2018-11-08 DIAGNOSIS — E872 Acidosis: Secondary | ICD-10-CM

## 2018-11-08 DIAGNOSIS — N179 Acute kidney failure, unspecified: Secondary | ICD-10-CM

## 2018-11-08 DIAGNOSIS — R1110 Vomiting, unspecified: Secondary | ICD-10-CM

## 2018-11-08 DIAGNOSIS — M170 Bilateral primary osteoarthritis of knee: Secondary | ICD-10-CM | POA: Diagnosis present

## 2018-11-08 HISTORY — DX: Major depressive disorder, single episode, unspecified: F32.9

## 2018-11-08 HISTORY — DX: Depression, unspecified: F32.A

## 2018-11-08 LAB — URINALYSIS, ROUTINE W REFLEX MICROSCOPIC
Bilirubin Urine: NEGATIVE
GLUCOSE, UA: NEGATIVE mg/dL
Ketones, ur: NEGATIVE mg/dL
NITRITE: NEGATIVE
PH: 5 (ref 5.0–8.0)
Protein, ur: 30 mg/dL — AB
Specific Gravity, Urine: 1.017 (ref 1.005–1.030)

## 2018-11-08 LAB — CBC
HCT: 39 % (ref 36.0–46.0)
Hemoglobin: 12 g/dL (ref 12.0–15.0)
MCH: 25.2 pg — AB (ref 26.0–34.0)
MCHC: 30.8 g/dL (ref 30.0–36.0)
MCV: 81.8 fL (ref 80.0–100.0)
Platelets: 409 10*3/uL — ABNORMAL HIGH (ref 150–400)
RBC: 4.77 MIL/uL (ref 3.87–5.11)
RDW: 16.5 % — ABNORMAL HIGH (ref 11.5–15.5)
WBC: 14.8 10*3/uL — ABNORMAL HIGH (ref 4.0–10.5)
nRBC: 0 % (ref 0.0–0.2)

## 2018-11-08 LAB — RENAL FUNCTION PANEL
Albumin: 2.6 g/dL — ABNORMAL LOW (ref 3.5–5.0)
Anion gap: 14 (ref 5–15)
BUN: 38 mg/dL — ABNORMAL HIGH (ref 8–23)
CO2: 18 mmol/L — ABNORMAL LOW (ref 22–32)
Calcium: 8 mg/dL — ABNORMAL LOW (ref 8.9–10.3)
Chloride: 108 mmol/L (ref 98–111)
Creatinine, Ser: 4.94 mg/dL — ABNORMAL HIGH (ref 0.44–1.00)
GFR calc Af Amer: 9 mL/min — ABNORMAL LOW (ref >60)
GFR calc non Af Amer: 8 mL/min — ABNORMAL LOW (ref >60)
Glucose, Bld: 73 mg/dL (ref 70–99)
Phosphorus: 5.9 mg/dL — ABNORMAL HIGH (ref 2.5–4.6)
Potassium: 4.3 mmol/L (ref 3.5–5.1)
Sodium: 140 mmol/L (ref 135–145)

## 2018-11-08 LAB — COMPREHENSIVE METABOLIC PANEL
ALT: 103 U/L — ABNORMAL HIGH (ref 0–44)
ALT: 89 U/L — ABNORMAL HIGH (ref 0–44)
AST: 66 U/L — ABNORMAL HIGH (ref 15–41)
AST: 59 U/L — ABNORMAL HIGH (ref 15–41)
Albumin: 3.8 g/dL (ref 3.5–5.0)
Albumin: 2.9 g/dL — ABNORMAL LOW (ref 3.5–5.0)
Alkaline Phosphatase: 429 U/L — ABNORMAL HIGH (ref 38–126)
Alkaline Phosphatase: 296 U/L — ABNORMAL HIGH (ref 38–126)
Anion gap: 28 — ABNORMAL HIGH (ref 5–15)
Anion gap: 19 — ABNORMAL HIGH (ref 5–15)
BILIRUBIN TOTAL: 2.5 mg/dL — AB (ref 0.3–1.2)
BUN: 34 mg/dL — ABNORMAL HIGH (ref 8–23)
BUN: 36 mg/dL — ABNORMAL HIGH (ref 8–23)
CO2: 15 mmol/L — ABNORMAL LOW (ref 22–32)
CO2: 19 mmol/L — ABNORMAL LOW (ref 22–32)
Calcium: 10.1 mg/dL (ref 8.9–10.3)
Calcium: 8.4 mg/dL — ABNORMAL LOW (ref 8.9–10.3)
Chloride: 96 mmol/L — ABNORMAL LOW (ref 98–111)
Chloride: 103 mmol/L (ref 98–111)
Creatinine, Ser: 6.04 mg/dL — ABNORMAL HIGH (ref 0.44–1.00)
Creatinine, Ser: 5.78 mg/dL — ABNORMAL HIGH (ref 0.44–1.00)
GFR calc Af Amer: 8 mL/min — ABNORMAL LOW (ref >60)
GFR calc non Af Amer: 6 mL/min — ABNORMAL LOW (ref >60)
GFR calc non Af Amer: 7 mL/min — ABNORMAL LOW (ref >60)
GFR, EST AFRICAN AMERICAN: 7 mL/min — AB (ref >60)
Glucose, Bld: 184 mg/dL — ABNORMAL HIGH (ref 70–99)
Glucose, Bld: 79 mg/dL (ref 70–99)
Potassium: 3.9 mmol/L (ref 3.5–5.1)
Potassium: 5 mmol/L (ref 3.5–5.1)
Sodium: 139 mmol/L (ref 135–145)
Sodium: 141 mmol/L (ref 135–145)
Total Bilirubin: 1.9 mg/dL — ABNORMAL HIGH (ref 0.3–1.2)
Total Protein: 9.6 g/dL — ABNORMAL HIGH (ref 6.5–8.1)
Total Protein: 7.6 g/dL (ref 6.5–8.1)

## 2018-11-08 LAB — CBC WITH DIFFERENTIAL/PLATELET
Abs Immature Granulocytes: 0.11 10*3/uL — ABNORMAL HIGH (ref 0.00–0.07)
BASOS PCT: 1 %
Basophils Absolute: 0.1 10*3/uL (ref 0.0–0.1)
Eosinophils Absolute: 0.1 10*3/uL (ref 0.0–0.5)
Eosinophils Relative: 1 %
HCT: 45.8 % (ref 36.0–46.0)
Hemoglobin: 14.1 g/dL (ref 12.0–15.0)
Immature Granulocytes: 1 %
Lymphocytes Relative: 17 %
Lymphs Abs: 2.2 10*3/uL (ref 0.7–4.0)
MCH: 25 pg — ABNORMAL LOW (ref 26.0–34.0)
MCHC: 30.8 g/dL (ref 30.0–36.0)
MCV: 81.2 fL (ref 80.0–100.0)
Monocytes Absolute: 0.6 10*3/uL (ref 0.1–1.0)
Monocytes Relative: 5 %
NRBC: 0 % (ref 0.0–0.2)
Neutro Abs: 9.9 10*3/uL — ABNORMAL HIGH (ref 1.7–7.7)
Neutrophils Relative %: 75 %
Platelets: 608 10*3/uL — ABNORMAL HIGH (ref 150–400)
RBC: 5.64 MIL/uL — ABNORMAL HIGH (ref 3.87–5.11)
RDW: 16.6 % — ABNORMAL HIGH (ref 11.5–15.5)
WBC: 13 10*3/uL — ABNORMAL HIGH (ref 4.0–10.5)

## 2018-11-08 LAB — LACTIC ACID, PLASMA
Lactic Acid, Venous: 3.7 mmol/L (ref 0.5–1.9)
Lactic Acid, Venous: 3.7 mmol/L (ref 0.5–1.9)
Lactic Acid, Venous: 2.2 mmol/L (ref 0.5–1.9)

## 2018-11-08 LAB — MAGNESIUM: MAGNESIUM: 2.3 mg/dL (ref 1.7–2.4)

## 2018-11-08 LAB — TROPONIN I: Troponin I: 0.03 ng/mL (ref <0.03)

## 2018-11-08 MED ORDER — VANCOMYCIN HCL IN DEXTROSE 1-5 GM/200ML-% IV SOLN: 1000.0000 mg | Freq: Once | INTRAVENOUS | Status: DC

## 2018-11-08 MED ORDER — ACETAMINOPHEN 325 MG PO TABS: 650.0000 mg | ORAL_TABLET | Freq: Four times a day (QID) | ORAL | Status: DC | PRN

## 2018-11-08 MED ORDER — ACETAMINOPHEN 650 MG RE SUPP: 650.0000 mg | Freq: Four times a day (QID) | RECTAL | Status: DC | PRN

## 2018-11-08 MED ORDER — SODIUM CHLORIDE 0.9 % IV BOLUS
2000.0000 mL | Freq: Once | INTRAVENOUS | Status: AC
  Administered 2018-11-08: 2000 mL via INTRAVENOUS

## 2018-11-08 MED ORDER — ENSURE ENLIVE PO LIQD: 237.0000 mL | Freq: Two times a day (BID) | ORAL | Status: DC

## 2018-11-08 MED ORDER — SODIUM CHLORIDE 0.9 % IV SOLN
1.0000 g | INTRAVENOUS | Status: DC
  Administered 2018-11-08: 1 g via INTRAVENOUS
  Filled 2018-11-08: qty 1

## 2018-11-08 MED ORDER — LACTATED RINGERS IV SOLN
INTRAVENOUS | Status: AC
  Administered 2018-11-08: 08:00:00 via INTRAVENOUS

## 2018-11-08 MED ORDER — SENNOSIDES-DOCUSATE SODIUM 8.6-50 MG PO TABS: 1.0000 | ORAL_TABLET | Freq: Every evening | ORAL | Status: DC | PRN

## 2018-11-08 MED ORDER — PROMETHAZINE HCL 25 MG PO TABS
12.5000 mg | ORAL_TABLET | Freq: Four times a day (QID) | ORAL | Status: DC | PRN
  Administered 2018-11-08: 12.5 mg via ORAL
  Filled 2018-11-08: qty 1

## 2018-11-08 MED ORDER — WHITE PETROLATUM EX OINT
TOPICAL_OINTMENT | CUTANEOUS | Status: AC
  Administered 2018-11-08: 14:00:00
  Filled 2018-11-08: qty 28.35

## 2018-11-08 MED ORDER — ONDANSETRON HCL 4 MG/2ML IJ SOLN
4.0000 mg | Freq: Once | INTRAMUSCULAR | Status: AC
  Administered 2018-11-08: 4 mg via INTRAVENOUS
  Filled 2018-11-08: qty 2

## 2018-11-08 MED ORDER — VANCOMYCIN HCL 10 G IV SOLR
1500.0000 mg | INTRAVENOUS | Status: DC
  Administered 2018-11-08: 1500 mg via INTRAVENOUS
  Filled 2018-11-08: qty 1500

## 2018-11-08 MED ORDER — NORTRIPTYLINE HCL 25 MG PO CAPS
25.0000 mg | ORAL_CAPSULE | Freq: Every day | ORAL | Status: DC
  Administered 2018-11-08 – 2018-11-10 (×3): 25 mg via ORAL
  Filled 2018-11-08 (×3): qty 1

## 2018-11-08 MED ORDER — HEPARIN SODIUM (PORCINE) 5000 UNIT/ML IJ SOLN
5000.0000 [IU] | Freq: Three times a day (TID) | INTRAMUSCULAR | Status: DC
  Administered 2018-11-08 – 2018-11-09 (×5): 5000 [IU] via SUBCUTANEOUS
  Filled 2018-11-08 (×5): qty 1

## 2018-11-08 MED ORDER — SODIUM CHLORIDE 0.9 % IV SOLN
2.0000 g | Freq: Once | INTRAVENOUS | Status: AC
  Administered 2018-11-08: 2 g via INTRAVENOUS
  Filled 2018-11-08: qty 2

## 2018-11-08 MED ORDER — SODIUM CHLORIDE 0.9 % IV BOLUS (SEPSIS)
1000.0000 mL | Freq: Once | INTRAVENOUS | Status: AC
  Administered 2018-11-08: 1000 mL via INTRAVENOUS

## 2018-11-08 MED ORDER — MOMETASONE FURO-FORMOTEROL FUM 200-5 MCG/ACT IN AERO
2.0000 | INHALATION_SPRAY | Freq: Two times a day (BID) | RESPIRATORY_TRACT | Status: DC
  Administered 2018-11-10 – 2018-11-11 (×3): 2 via RESPIRATORY_TRACT
  Filled 2018-11-08 (×2): qty 8.8

## 2018-11-08 MED ORDER — METRONIDAZOLE IN NACL 5-0.79 MG/ML-% IV SOLN
500.0000 mg | Freq: Three times a day (TID) | INTRAVENOUS | Status: DC
  Administered 2018-11-08 – 2018-11-09 (×4): 500 mg via INTRAVENOUS
  Filled 2018-11-08 (×4): qty 100

## 2018-11-08 NOTE — Progress Notes (Signed)
Pharmacy Antibiotic Note  Kimberly Vazquez is a 73 y.o. female admitted on 11/08/2018 with sepsis.  Pharmacy has been consulted for Vancomycin/Cefepime dosing. Recently discharged with a biliary drain in place. Pt is in acute renal failure with Scr of 6. WBC is elevated. Some hypotension. Febrile up to 100.4.   Plan: Vancomycin 1500 mg IV x 1, f/u Scr trend over the next 24-48 hours for additional vancomycin dosing Cefepime 1g IV q24h Flagyl per MD Trend WBC, temp, renal function  F/U infectious work-up Drug levels as indicated  Temp (24hrs), Avg:100.4 F (38 C), Min:100.4 F (38 C), Max:100.4 F (38 C)  Recent Labs  Lab 11/02/18 0520 11/03/18 0427 11/04/18 0642 11/05/18 0348 11/06/18 0343 11/08/18 0022 11/08/18 0023  WBC 14.7* 8.2 8.8 7.4  --  13.0*  --   CREATININE 1.07* 1.06* 0.98 0.94 1.14* 6.04*  --   LATICACIDVEN  --   --   --   --   --   --  3.7*    Estimated Creatinine Clearance: 8.8 mL/min (A) (by C-G formula based on SCr of 6.04 mg/dL (H)).    No Known Allergies   Narda Bonds 11/08/2018 3:54 AM

## 2018-11-08 NOTE — H&P (Signed)
Date: 11/08/2018               Patient Name:  Kimberly Vazquez MRN: [id number]  DOB: 02/26/1946 Age / Sex: 73 y.o., female   PCP: Willey Blade, MD         Medical Service: Internal Medicine Teaching Service         Attending Physician: Dr. Annia Belt, MD    First Contact: Dr. Modena Nunnery Pager: [phone]  Second Contact: Dr. Welford Roche Pager: [phone]       After Hours (After 5p/  First Contact Pager: [phone]  weekends / holidays): Second Contact Pager: [phone]   Chief Complaint: Nausea  History of Present Illness: Mrs.Wessling is a 73 yo F w/ PMH of HTN, SLE on plaquenil, chronic ischuria and anemia present with nausea after recent hospitalization for obstructive jaundice. She was examined and evaluated at bedside with husband present. 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 She also had developed post-ERCP pancreatitis which was treated with bowel rest and fluid resuscitation. She had percutaneous drain placed by IR during hospitalization and her bilirubin came down and she was able to tolerate oral intake. She was discharged with close follow up with IR, GI, and surgery for possible Whipple on 11/06/18. After discharge, she picked up her zofran and came home. She was able to tolerate some chicken soup that night but also tried some mac and cheese but was unable to tolerate it and vomited non-bilious non-bloody emesis despite taking her zofran dose. She also tried some sausage, bacon and eggs the next morning and also vomited those up as well. She was able to tolerate some fluids but stopped drinking due to fear of repeat emesis. Later that afternoon she had significant nausea, weakness and somnolence and her husband became very alarmed. She was advised to come to ED for  evaluation after speaking with the team on the off-hour number.  During this time period, she kept a meticulous record of her biliary drain output which showed 2.5L output during 24 hour period after discharge. Her husband mentions the drain output appeared to darken with time. She also mentions when she tried to self-cath for her urinary retention she had minimal urine output. She endorse some suprapubic abdominal pain but denies any diarrhea or constipation. States she had 3 formed bowel movements, described as tan-colored, since discharge. She also experienced subjective fever and chills. She denies any urinary frequency, urgency, chest pain, palpitations, headaches, blurry vision.  In the ED, she was found to be hypotensive (74/52 on manual read) and febrile (100.46F) with elevated lactate. She was also noted to have significant AKI. Blood cultures were drawn and she was given 3L NS boluses as well as cefepime, vancomycin, and metronidazole. IM was consulted for admission.  Meds:  Current Meds  Medication Sig  . budesonide-formoterol (SYMBICORT) 160-4.5 MCG/ACT inhaler Inhale 2 puffs into the lungs 2 (two) times daily as needed (cough/congestion).  . cholecalciferol (VITAMIN D) 1000 UNITS tablet Take 1,000 Units by mouth daily.   . hydroxychloroquine (PLAQUENIL) 200 MG tablet TAKE ONE TABLET BY MOUTH TWICE A DAY MONDAY THROUGH FRIDAY. (Patient taking differently: Take 200 mg by mouth See admin instructions. Take one tablet (200 mg) by mouth twice daily on Monday thru Friday (skip Saturday and Sunday))  . Multiple Vitamin (MULTIVITAMIN WITH  MINERALS) TABS tablet Take 1 tablet by mouth daily.  . naproxen sodium (ALEVE) 220 MG tablet Take 440 mg by mouth daily as needed (pain/headache).  . nitrofurantoin (MACRODANTIN) 50 MG capsule Take 50 mg by mouth daily.   . nortriptyline (PAMELOR) 25 MG capsule TAKE 1 CAPSULE (25 MG TOTAL) BY MOUTH AT BEDTIME. (Patient taking differently: Take 25 mg by mouth  daily. )  . Omega-3 Fatty Acids (FISH OIL) 1000 MG CPDR Take 1,000 mg by mouth daily.   . ondansetron (ZOFRAN ODT) 4 MG disintegrating tablet Take 1 tablet (4 mg total) by mouth every 8 (eight) hours as needed for nausea or vomiting.  Marland Kitchen OVER THE COUNTER MEDICATION Take 1 capsule by mouth daily. FlameRX - proprietary blend of turmeric, resveratrol, B vitamins  . Polyethyl Glycol-Propyl Glycol (SYSTANE OP) Place 1 drop into both eyes daily as needed (dry eyes).  . tolnaftate (TINACTIN) 1 % cream Apply 1 application topically 2 (two) times daily as needed (foot fungus).    Allergies: Allergies as of 11/08/2018  . (No Known Allergies)   Past Medical History:  Diagnosis Date  . Allergic rhinitis   . Anemia   . Arthritis   . Bladder disorder    "lazy" , caths twice a day  . Fatty liver   . GERD (gastroesophageal reflux disease)   . Hypertension   . Seasonal allergies   . Systemic lupus erythematosus ([place])   . UTI (urinary tract infection)   . Vitamin D deficiency     Family History:  Mentions sister had cervical cancer at age 33. No other cancers that she is aware of.  Family History  Problem Relation Age of Onset  . Cancer Brother        unknown  . Kidney disease Son   . Diabetes Brother   . Heart disease Brother   . Colon cancer Neg Hx    Social History:  Lives at home with husband. Used to work as Pharmacist, hospital, but currently retired. Denies any current tobacco or alcohol use. States last drink around Christmas time. No tobacco use in more than a decade.  Social History   Tobacco Use  . Smoking status: Former Smoker    Years: 22.00    Types: Cigarettes    Last attempt to quit: 03/21/2002    Years since quitting: 16.6  . Smokeless tobacco: Never Used  Substance Use Topics  . Alcohol use: Yes    Comment: rare  . Drug use: No   Review of Systems: A complete ROS was negative except as per HPI.  Physical Exam: Blood pressure (!) 115/58, pulse 85, temperature (!) 100.4 F  (38 C), temperature source Rectal, resp. rate 16, SpO2 100 %. Physical Exam  Constitutional:  Appear somnolent but pleasant. AAOx3  HENT:  Mouth/Throat: Oropharynx is clear and moist. No oropharyngeal exudate.  Yellowing of mucosal membrane  Eyes: Conjunctivae are normal. Scleral icterus is present.  Neck: Normal range of motion. Neck supple. No JVD present.  Cardiovascular: Normal rate, regular rhythm, normal heart sounds and intact distal pulses.  No murmur heard. Pulmonary/Chest: Effort normal and breath sounds normal. She has no wheezes. She has no rales.  Abdominal: Soft. Bowel sounds are normal. There is abdominal tenderness (suprapubic tenderness to palpation). There is no rebound and no guarding.  Biliary drain in place and functioning without any leakage, surrounding edema, or erythema. Bag has cloudy dark yellowish-green fluid.  Musculoskeletal: Normal range of motion.  Lymphadenopathy:    She has  no cervical adenopathy.  Neurological:  Tremulous but no asterixis  Skin: Skin is warm and dry.  Poor skin turgor with tenting   EKG: personally reviewed my interpretation is normal sinus, normal axis, no prior EKG to compare, no ST elevation or depression, no T wave inversion, prolonged QTc at 495  CXR: personally reviewed my interpretation is wide mediastinum with possible hiatal hernia, no lobar consolidation, no pleural effusion.  Assessment & Plan by Problem: Active Problems:   Acute renal failure (ARF) [facility])  Mrs.Sassi is a 73 yo F w/ PMH of HTN, SLE on plaquenil, chronic ischuria and anemia presenting with nausea, vomiting and weakness. Per chart review, she had significant volume loss from her biliary drain prior to discharge which was being replenished via IV fluids. Most likely had significant dehydration due to continued fluid loss and inability to replenish with oral intake due to nausea and vomiting. Possible concomitant infectious etiology for dehydration with  biliary drain vs UTI for source. Will require admission for aggressive fluid resuscitation, IV abx and possible earlier surgical intervention for her duodenal polyps.   Hypotension 2/2 hypovolemia Admit bp 74/52. Lactate 3.7 Current bp 115/58 after 3L bolus. Borderline fever at 100.17F. 1/3 qSofa. Leukocytosis with left shit at 13 but most likely due to hemoconcentration since she has concomitant thrombocytosis and elevated hgb from baseline. Low suspicion of infection - Vanc, Cefepime, metronidazole given by ED provider - Narrow abx regimen as appropriate pending blood cultures - IV LR 125cc/hr - Monitor I&Os  - F/u UA & Urine culture  AKI 2/2 pre-renal azotemia 2/2 dehydration Baseline creatinine ~1.0. Admit creatinine 6.04. GFR @ 7 Minimal urine output per pt. ED nurse attempted I&O to collect UA but had found no output. 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 Biopsy during ERCP show tubulovillous adenoma with high grade dysplasia. Draining ~3.5L daily during hospitalization. ~2.5L drained at home. Admit AST 66, ALT 103, Alk Phos 429, TBili 2.5. Icteric sclerae on exam. Drain appear to be functioning well. Planned for surgery consult for possible Whipple as outpatient. - GI consult in AM - Trend CMP - Phenergan 12.6m q6hr PRN for nausea  Hx of Asthma - c/w home med: Dulera 200-5 inhaler 2 puffs BID  Hx of Depression/Anxiety - C/w home meds: nortriptyline 221mdaily qhs  Hx of SLE On plaquenil 20058mO BID M-F - Hold plaquenil for now. Can restart if renal function recovers  DVT prophx: sbuqheparin Diet: Renal diet Code: Full  Dispo: Admit patient to Inpatient with expected length of stay greater than 2 midnights.  Signed: LeeMosetta AnisD 11/08/2018, 4:16 AM  Pager: [phone]

## 2018-11-08 NOTE — ED Provider Notes (Signed)
[facility] EMERGENCY DEPARTMENT Provider Note   CSN: [id number] Arrival date & time: 11/08/18  0006     History   Chief Complaint Chief Complaint  Patient presents with  . Weakness    HPI Kimberly Vazquez is a 73 y.o. female.  The history is provided by the patient.  She has history of hypertension, lupus, GERD, chronic kidney disease, obstructive jaundice and comes in because of weakness and vomiting.  She states that she has felt weak for the last 2 days and she has been commenting during this time.  She has not been able to hold anything down.  She denies any diarrhea.  She denies fever but has had some chills and sweats.  She did have some abdominal cramping several days ago, but none today.  She denies any urinary difficulty.  She does have a percutaneous biliary drain in place.  Past Medical History:  Diagnosis Date  . Allergic rhinitis   . Anemia   . Arthritis   . Bladder disorder    "lazy" , caths twice a day  . Fatty liver   . GERD (gastroesophageal reflux disease)   . Hypertension   . Seasonal allergies   . Systemic lupus erythematosus ([place])   . UTI (urinary tract infection)   . Vitamin D deficiency     Patient Active Problem List   Diagnosis Date Noted  . Tubulovillous adenoma of small intestine   . AKI (acute kidney injury) ([place])   . Post-ERCP acute pancreatitis 11/01/2018  . Dilation of biliary tract 10/31/2018  . Obstructive jaundice   . Biliary tract imaging abnormality   . Dehydration   . Dizziness 07/14/2018  . Hemorrhoids 07/14/2018  . Noncompliance with medication regimen 03/07/2017  . Rheumatoid factor positive 03/07/2017  . Primary insomnia 03/07/2017  . Primary osteoarthritis of both hands 03/07/2017  . Primary osteoarthritis of both knees 03/07/2017  . History of recurrent UTIs 03/07/2017  . Vitamin D deficiency 03/07/2017  . High risk medication use 08/29/2016  . Preventive measure 07/07/2015  . Autoimmune disease  ([place]) 06/20/2014  . 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 CKD (chronic kidney disease), stage III ([place]) 06/20/2014    Past Surgical History:  Procedure Laterality Date  . ABDOMINAL HYSTERECTOMY    . BIOPSY  10/31/2018   Procedure: BIOPSY;  Surgeon: Gatha Mayer, MD;  Location: [facility];  Service: Endoscopy;;  . ENDOSCOPIC RETROGRADE CHOLANGIOPANCREATOGRAPHY (ERCP) WITH PROPOFOL N/A 10/31/2018   Procedure: ENDOSCOPIC RETROGRADE CHOLANGIOPANCREATOGRAPHY (ERCP) WITH PROPOFOL;  Surgeon: Gatha Mayer, MD;  Location: [facility] ENDOSCOPY;  Service: Endoscopy;  Laterality: N/A;  . IR INT EXT BILIARY DRAIN WITH CHOLANGIOGRAM  11/03/2018  . ROTATOR CUFF REPAIR Right 2012  . TONSILLECTOMY  1965     OB History   No obstetric history on file.      Home Medications    Prior to Admission medications   Medication Sig Start Date End Date Taking? Authorizing Provider  budesonide-formoterol (SYMBICORT) 160-4.5 MCG/ACT inhaler Inhale 2 puffs into the lungs 2 (two) times daily as needed (cough/congestion).    [provider]  cholecalciferol (VITAMIN D) 1000 UNITS tablet Take 1,000 Units by mouth daily.     [provider]  Guaifenesin (MUCINEX MAXIMUM STRENGTH) 1200 MG TB12 Take 1,200 mg by mouth every 12 (twelve) hours.    [provider]  hydroxychloroquine (PLAQUENIL) 200 MG tablet TAKE ONE TABLET BY MOUTH TWICE A DAY MONDAY THROUGH FRIDAY. Patient taking differently: Take  200 mg by mouth See admin instructions. Take one tablet (200 mg) by mouth twice daily on Monday thru Friday (skip Saturday and Sunday) 10/22/18   Bo Merino, MD  Multiple Vitamin (MULTIVITAMIN WITH MINERALS) TABS tablet Take 1 tablet by mouth daily.    [provider]  naproxen sodium (ALEVE) 220 MG tablet Take 440 mg by mouth daily as needed (pain/headache).    [provider]  nitrofurantoin (MACRODANTIN) 50 MG capsule Take 50 mg by mouth daily.      [provider]  nortriptyline (PAMELOR) 25 MG capsule TAKE 1 CAPSULE (25 MG TOTAL) BY MOUTH AT BEDTIME. Patient taking differently: Take 25 mg by mouth daily.  10/31/17   Jessy Oto, MD  Omega-3 Fatty Acids (FISH OIL) 1000 MG CPDR Take 1,000 mg by mouth daily.     [provider]  ondansetron (ZOFRAN ODT) 4 MG disintegrating tablet Take 1 tablet (4 mg total) by mouth every 8 (eight) hours as needed for nausea or vomiting. 11/06/18   [place], Carley D, DO  OVER THE COUNTER MEDICATION Take 1 capsule by mouth daily. FlameRX - proprietary blend of turmeric, resveratrol, B vitamins    [provider]  Polyethyl Glycol-Propyl Glycol (SYSTANE OP) Place 1 drop into both eyes daily as needed (dry eyes).    [provider]  tolnaftate (TINACTIN) 1 % cream Apply 1 application topically 2 (two) times daily.    [provider]    Family History Family History  Problem Relation Age of Onset  . Cancer Brother        unknown  . Kidney disease Son   . Diabetes Brother   . Heart disease Brother   . Colon cancer Neg Hx     Social History Social History   Tobacco Use  . Smoking status: Former Smoker    Years: 22.00    Types: Cigarettes    Last attempt to quit: 03/21/2002    Years since quitting: 16.6  . Smokeless tobacco: Never Used  Substance Use Topics  . Alcohol use: Yes    Comment: rare  . Drug use: No     Allergies   Patient has no known allergies.   Review of Systems Review of Systems  All other systems reviewed and are negative.    Physical Exam Updated Vital Signs BP (!) 115/58   Pulse 85   Temp (!) 100.4 F (38 C) (Rectal)   Resp 16   SpO2 100%   Physical Exam Vitals signs and nursing note reviewed.    73 year old female, mildly diaphoretic and weak, but in no acute distress. Vital signs are significant for low blood pressure and low-grade fever. Oxygen saturation is 100%, which is normal. Head is normocephalic and  atraumatic. PERRLA, EOMI. Oropharynx is clear. Neck is nontender and supple without adenopathy or JVD. Back is nontender and there is no CVA tenderness. Lungs are clear without rales, wheezes, or rhonchi. Chest is nontender. Heart has regular rate and rhythm without murmur. Abdomen is soft, flat, nontender without masses or hepatosplenomegaly and peristalsis is hypoactive.  Biliary drain present right upper quadrant. Extremities have trace edema, full range of motion is present. Skin is warm and dry without rash. Neurologic: Mental status is normal, cranial nerves are intact, there are no motor or sensory deficits.  ED Treatments / Results  Labs (all labs ordered are listed, but only abnormal results are displayed) Labs Reviewed  COMPREHENSIVE METABOLIC PANEL - Abnormal; Notable for the following  components:      Result Value   Chloride 96 (*)    CO2 15 (*)    Glucose, Bld 184 (*)    BUN 34 (*)    Creatinine, Ser 6.04 (*)    Total Protein 9.6 (*)    AST 66 (*)    ALT 103 (*)    Alkaline Phosphatase 429 (*)    Total Bilirubin 2.5 (*)    GFR calc non Af Amer 6 (*)    GFR calc Af Amer 7 (*)    Anion gap 28 (*)    All other components within normal limits  CBC WITH DIFFERENTIAL/PLATELET - Abnormal; Notable for the following components:   WBC 13.0 (*)    RBC 5.64 (*)    MCH 25.0 (*)    RDW 16.6 (*)    Platelets 608 (*)    Neutro Abs 9.9 (*)    Abs Immature Granulocytes 0.11 (*)    All other components within normal limits  LACTIC ACID, PLASMA - Abnormal; Notable for the following components:   Lactic Acid, Venous 3.7 (*)    All other components within normal limits  TROPONIN I - Abnormal; Notable for the following components:   Troponin I 0.03 (*)    All other components within normal limits  CULTURE, BLOOD (ROUTINE X 2)  CULTURE, BLOOD (ROUTINE X 2)  URINE CULTURE  MAGNESIUM  URINALYSIS, ROUTINE W REFLEX MICROSCOPIC  LACTIC ACID, PLASMA  CBC  COMPREHENSIVE METABOLIC  PANEL  URINALYSIS, ROUTINE W REFLEX MICROSCOPIC    EKG EKG Interpretation  Date/Time:  Sunday November 08 2018 00:28:07 EST Ventricular Rate:  96 PR Interval:    QRS Duration: 94 QT Interval:  391 QTC Calculation: 495 R Axis:   28 Text Interpretation:  Sinus rhythm Probable left atrial enlargement Borderline prolonged QT interval When compared with ECG of 01/17/2005, QT has lengthened Confirmed by Delora Fuel ([id number]) on 11/08/2018 12:30:57 AM   Radiology Dg Chest Port 1 View  Result Date: 11/08/2018 CLINICAL DATA:  Hypotension. Weakness with nausea and vomiting since yesterday. Recent percutaneous biliary drain placement. EXAM: PORTABLE CHEST 1 VIEW COMPARISON:  04/10/2017 FINDINGS: Midline trachea. Normal heart size. Small to moderate hiatal hernia. Atherosclerosis in the transverse aorta. No pleural effusion or pneumothorax. Clear lungs. IMPRESSION: 1. No acute cardiopulmonary disease. 2. Hiatal hernia. 3. Aortic Atherosclerosis (ICD10-I70.0). Electronically Signed   By: Abigail Miyamoto M.D.   On: 11/08/2018 00:47    Procedures Procedures  CRITICAL CARE Performed by: Delora Fuel Total critical care time: 45 minutes Critical care time was exclusive of separately billable procedures and treating other patients. Critical care was necessary to treat or prevent imminent or life-threatening deterioration. Critical care was time spent personally by me on the following activities: development of treatment plan with patient and/or surrogate as well as nursing, discussions with consultants, evaluation of patient's response to treatment, examination of patient, obtaining history from patient or surrogate, ordering and performing treatments and interventions, ordering and review of laboratory studies, ordering and review of radiographic studies, pulse oximetry and re-evaluation of patient's condition.)  Medications Ordered in ED Medications  ondansetron (ZOFRAN) injection 4 mg (has no  administration in time range)  sodium chloride 0.9 % bolus 2,000 mL (has no administration in time range)     Initial Impression / Assessment and Plan / ED Course  I have reviewed the triage vital signs and the nursing notes.  Pertinent labs & imaging results that were available during my care of  the patient were reviewed by me and considered in my medical decision making (see chart for details).  Vomiting, weakness, hypotension which is probably secondary to hypovolemia.  Will initiate septic work-up.  Old records reviewed confirming recent hospitalization for obstructive jaundice with placement of biliary drain.  She will be given IV fluids and ondansetron.  Screening labs are obtained.  Of note, biopsies at time of recent ERCP were negative for cancer.  Blood pressure is come up with IV fluids.  Lactic acid level was significantly elevated and this is felt likely due to dehydration rather than sepsis.  Temperatures come back borderline elevated, so she is started on sepsis pathway and is started on antibiotics for sepsis of undetermined origin.  Labs have come back showing acute renal failure with creatinine 6.04 and BUN 34 compared with creatinine 1.14 and BUN 11 on January 17.  This does seem to be an unusually rapid rise in creatinine and will need to be followed closely.  Elevation of alkaline phosphatase, AST, ALT, bilirubin consistent with known obstructive jaundice.  Chest x-ray shows no evidence of pneumonia.  ECG shows borderline QT prolongation.  Case is discussed with Dr. Maricela Bo of internal medicine teaching service who agrees to admit the patient.  Final Clinical Impressions(s) / ED Diagnoses   Final diagnoses:  Acute renal failure, unspecified acute renal failure type ([place])  Non-intractable vomiting with nausea, unspecified vomiting type  Dehydration  Obstructive jaundice  Sepsis due to undetermined organism [facility])    ED Discharge Orders    None       Delora Fuel,  MD 74/82/70 [phone]

## 2018-11-08 NOTE — ED Triage Notes (Signed)
Pt states she had recent drain placed in her liver. Pt endorses increasing weakness and n/v since yesterday. Pt is pale, diaphoretic, cool to touch, and unable to hold herself up on arrival to room. Manual BP taken of 74/52.  EDP at bedside. IVs placed bialteral ACs.

## 2018-11-08 NOTE — ED Notes (Addendum)
Pt asked about providing a urine sample. Pt bladder scanned. Highest amount scanned was 118 ml. Pt stated that she will self cath. Informed Cindy - RN.

## 2018-11-08 NOTE — ED Notes (Signed)
Pt states she self caths twice a day. Yesterday she wasn't able to get only a very small amount.  Today she got none. States she makes very little urine. In and out cath performed with no urine return.

## 2018-11-08 NOTE — Progress Notes (Signed)
KUB shows biliary drain in appropriate position - flushes easily on exam. External biliary drain capped today at 12:00 prior to departure from ED for floor.   Do not flush drain at this time - if drain remains capped at discharge will need to flush daily and be provided with gravity bag prior to d/c.   CMP ordered for 0500 tomorrow to assess t.bili. IR PA will follow up tomorrow.  Please call with questions or concerns.   Candiss Norse, PA-C

## 2018-11-08 NOTE — ED Notes (Signed)
Patient transported to X-ray 

## 2018-11-08 NOTE — ED Notes (Signed)
Attempted report x1. 

## 2018-11-08 NOTE — ED Notes (Signed)
Delay in patient to floor. Need verification of new lactic result that meets criteria for the floor assigned.

## 2018-11-08 NOTE — ED Notes (Signed)
Breakfast trays ordered  

## 2018-11-08 NOTE — Progress Notes (Signed)
   Subjective:  Kimberly Vazquez is feeling better this AM when evaluated by the team. She continues to feel nauseous and is not tolerating any PO intake. Denies abdominal pain. Remains anuric.   Objective:  Vital signs in last 24 hours: Vitals:   11/08/18 0815 11/08/18 0900 11/08/18 0945 11/08/18 1100  BP: 107/62   117/66  Pulse: 83 87 81 78  Resp: 14 20 16 18   Temp:      TempSrc:      SpO2: 97% 96% 98% 97%   General: elderly female, sleepy but in NAD  CV: RRR, no mrg  Pulm: appeared comfortable in RA  Abd: drain in place at LUQ, no signs of infection, dressing c/d/i, abdomen is soft, NTND with normoactive bowel sounds   Assessment/Plan:  Active Problems:   Acute renal failure (ARF) (HCC)  # Acute renal failure 2/2 dehydration and likely ATN from hypotension: Kimberly Vazquez remains oliguric, but she is euvolemic on exam. We will continue bladder scans q8h hours and ordered I/O caths PRN as this is what she does at home. No electrolytes abnormalities noted on most recent labs. We will continue to hydrate with IVF. I expect her renal function to improve over the next few days. No indications for HD at this time. Will repeat CMP later today to ensure renal function continues to improve. Low suspicion for lupus nephritis at this time. Most recent workup for this 1 week ago was negative (ds-DNA and complement levels). Would consider repeating if renal function does not improve.   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 Dr. Ardis Hughs from GI reached out to me. He recommended consulting IR to evaluate drain placement, education on drain management, and a cap trial to address output. They have been consulted and have ordered a KUB to assess placement. From a GI standpoint, patient remains unobstructed and no further intervention is required at this time  other than her surgical follow up as an outpatient. He does not foresee patient needed surgical consultation during this admission. I have discontinued vancomycin. We will continue cefepime and flagyl for today given low grade fever, hypotension, and leukocytosis though I suspect this is all a stress reaction from acute renal failure and dehydration. Will plan to d/c tomorrow if patient remains stable.   # HTN: holding home medication in the setting of severe dehydration and hypotension.   # SLE: Holding Plaquenil in the setting of acute renal failure.   Dispo: Anticipated discharge pending improvement in renal function and in drain output.  Welford Roche, MD 11/08/2018, 11:37 AM Pager: [phone]

## 2018-11-08 NOTE — ED Notes (Signed)
Bladder scan shows 20 mL

## 2018-11-08 NOTE — Consult Note (Signed)
Chief Complaint: Patient was seen in consultation today for internal/external biliary drain with significant output  Referring Physician(s): Dr. Beryle Beams  Supervising Physician: Markus Daft  Patient Status: [facility] - ED  History of Present Illness: Kimberly Vazquez is a 73 y.o. 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 Vernard Gambles. She was discharged on 11/06/18 with close follow up in place and plans to discuss possible Whipple procedure. Patient reports ever since discharged from the hospital she has been unable to tolerate nearly anything by mouth and also been experiencing nausea/vomiting not helped by antiemetics, decreased urinary output, weakness and sweating. She called the internal medicine service last evening regarding her symptoms and was instructed to come to the ED. In the ED she was found to be hypotensive, febrile with leukocytosis, elevated lactic acid and AKI. She has been given IV fluids, cefepime, vancomycin and metronidazole with plans for admission for further evaluation and management. IR has been consulted for drain evaluation.  Patient and husband report significant output from drain since discharge ranging from 1-2 L of clear yellow output daily. Her husband reports that the output appears somewhat darker than when it was initially placed, they have not noted any purulent or bloody drainage in the collection bag or from the drain insertion site. She denies any abdominal pain or jaundice.   Past Medical History:  Diagnosis Date  . Allergic rhinitis   . Anemia   . Arthritis   . Bladder disorder    "lazy" , caths twice a day  . Fatty liver   . GERD (gastroesophageal reflux disease)   . Hypertension   . Seasonal allergies   .  Systemic lupus erythematosus ([place])   . UTI (urinary tract infection)   . Vitamin D deficiency     Past Surgical History:  Procedure Laterality Date  . ABDOMINAL HYSTERECTOMY    . BIOPSY  10/31/2018   Procedure: BIOPSY;  Surgeon: Gatha Mayer, MD;  Location: [place];  Service: Endoscopy;;  . ENDOSCOPIC RETROGRADE CHOLANGIOPANCREATOGRAPHY (ERCP) WITH PROPOFOL N/A 10/31/2018   Procedure: ENDOSCOPIC RETROGRADE CHOLANGIOPANCREATOGRAPHY (ERCP) WITH PROPOFOL;  Surgeon: Gatha Mayer, MD;  Location: [facility] ENDOSCOPY;  Service: Endoscopy;  Laterality: N/A;  . IR INT EXT BILIARY DRAIN WITH CHOLANGIOGRAM  11/03/2018  . ROTATOR CUFF REPAIR Right 2012  . TONSILLECTOMY  1965    Allergies: Patient has no known allergies.  Medications: Prior to Admission medications   Medication Sig Start Date End Date Taking? Authorizing Provider  budesonide-formoterol (SYMBICORT) 160-4.5 MCG/ACT inhaler Inhale 2 puffs into the lungs 2 (two) times daily as needed (cough/congestion).   Yes [provider]  cholecalciferol (VITAMIN D) 1000 UNITS tablet Take 1,000 Units by mouth daily.    Yes [provider]  hydroxychloroquine (PLAQUENIL) 200 MG tablet TAKE ONE TABLET BY MOUTH TWICE A DAY MONDAY THROUGH FRIDAY. Patient taking differently: Take 200 mg by mouth See admin instructions. Take one tablet (200 mg) by mouth twice daily on Monday thru Friday (skip Saturday and Sunday) 10/22/18  Yes Deveshwar, Abel Presto, MD  Multiple Vitamin (MULTIVITAMIN WITH MINERALS) TABS tablet Take 1 tablet by mouth daily.   Yes [provider]  naproxen sodium (ALEVE) 220 MG tablet Take 440 mg by mouth daily as needed (pain/headache).   Yes [provider]  nitrofurantoin (MACRODANTIN) 50 MG capsule  Take 50 mg by mouth daily.    Yes [provider]  nortriptyline (PAMELOR) 25 MG capsule TAKE 1 CAPSULE (25 MG TOTAL) BY MOUTH AT BEDTIME. Patient taking differently: Take 25 mg by mouth daily.   10/31/17  Yes Jessy Oto, MD  Omega-3 Fatty Acids (FISH OIL) 1000 MG CPDR Take 1,000 mg by mouth daily.    Yes [provider]  ondansetron (ZOFRAN ODT) 4 MG disintegrating tablet Take 1 tablet (4 mg total) by mouth every 8 (eight) hours as needed for nausea or vomiting. 11/06/18  Yes [place], Carley D, DO  OVER THE COUNTER MEDICATION Take 1 capsule by mouth daily. FlameRX - proprietary blend of turmeric, resveratrol, B vitamins   Yes [provider]  Polyethyl Glycol-Propyl Glycol (SYSTANE OP) Place 1 drop into both eyes daily as needed (dry eyes).   Yes [provider]  tolnaftate (TINACTIN) 1 % cream Apply 1 application topically 2 (two) times daily as needed (foot fungus).    Yes [provider]     Family History  Problem Relation Age of Onset  . Cancer Brother        unknown  . Kidney disease Son   . Diabetes Brother   . Heart disease Brother   . Colon cancer Neg Hx     Social History   Socioeconomic History  . Marital status: Married    Spouse name: Not on file  . Number of children: 2  . Years of education: Not on file  . Highest education level: Not on file  Occupational History  . Occupation: Retired [employer] Professor    Employer: RETIRED  Social Needs  . Financial resource strain: Not on file  . Food insecurity:    Worry: Not on file    Inability: Not on file  . Transportation needs:    Medical: Not on file    Non-medical: Not on file  Tobacco Use  . Smoking status: Former Smoker    Years: 22.00    Types: Cigarettes    Last attempt to quit: 03/21/2002    Years since quitting: 16.6  . Smokeless tobacco: Never Used  Substance and Sexual Activity  . Alcohol use: Yes    Comment: rare  . Drug use: No  . Sexual activity: Not on file  Lifestyle  . Physical activity:    Days per week: Not on file    Minutes per session: Not on file  . Stress: Not on file  Relationships  . Social connections:    Talks on phone: Not on  file    Gets together: Not on file    Attends religious service: Not on file    Active member of club or organization: Not on file    Attends meetings of clubs or organizations: Not on file    Relationship status: Not on file  Other Topics Concern  . Not on file  Social History Narrative  . Not on file     Review of Systems: A 12 point ROS discussed and pertinent positives are indicated in the HPI above.  All other systems are negative.  Review of Systems  Constitutional: Positive for appetite change, diaphoresis, fatigue and fever. Negative for chills.  Respiratory: Negative for cough and shortness of breath.   Cardiovascular: Negative for chest pain.  Gastrointestinal: Positive for nausea and vomiting. Negative for abdominal pain and diarrhea.  Genitourinary: Positive for decreased urine volume.    Vital Signs: BP 107/62   Pulse 87  Temp 99 F (37.2 C) (Oral)   Resp 20   SpO2 96%   Physical Exam Vitals signs and nursing note reviewed.  Constitutional:      General: She is not in acute distress.    Comments: Husband at bedside. Patient laying supine on bed, not in acute distress, able to speak in full sentences easily.   HENT:     Head: Normocephalic.  Eyes:     General: No scleral icterus. Cardiovascular:     Rate and Rhythm: Normal rate.  Pulmonary:     Effort: Pulmonary effort is normal.  Abdominal:     General: There is no distension.     Palpations: Abdomen is soft.     Comments: (+) RUQ drain to gravity bag with approximately 300 mL clear, bilious output. Stat lock in place, suture in tact. Insertion site clean, dry, dressed appropriately without erythema, edema, drainage or active bleeding.    Skin:    General: Skin is warm and dry.     Coloration: Skin is not jaundiced.  Neurological:     Mental Status: She is alert. Mental status is at baseline.  Psychiatric:        Mood and Affect: Mood normal.        Behavior: Behavior normal.        Thought  Content: Thought content normal.      Imaging: Ct Abdomen Pelvis Wo Contrast  Result Date: 10/29/2018 CLINICAL DATA:  Nausea, vomiting. EXAM: CT ABDOMEN AND PELVIS WITHOUT CONTRAST TECHNIQUE: Multidetector CT imaging of the abdomen and pelvis was performed following the standard protocol without IV contrast. COMPARISON:  CT scan of February 17, 2006. FINDINGS: Lower chest: No acute abnormality. Hepatobiliary: No focal abnormality is seen in the liver on these unenhanced images. No gallstones are noted. However, dilated gallbladder is noted with severe dilatation of common bile duct without definite evidence of obstructing calculus. Pancreas: No definite inflammation is noted. Dilatation of pancreatic duct in head is noted of unknown etiology. Spleen: Normal in size without focal abnormality. Adrenals/Urinary Tract: Adrenal glands are unremarkable. Kidneys are normal, without renal calculi, focal lesion, or hydronephrosis. Bladder is unremarkable. Stomach/Bowel: Stomach is within normal limits. Appendix appears normal. No evidence of bowel wall thickening, distention, or inflammatory changes. Vascular/Lymphatic: Aortic atherosclerosis. No enlarged abdominal or pelvic lymph nodes. Reproductive: Status post hysterectomy. No adnexal masses. Other: No abdominal wall hernia or abnormality. No abdominopelvic ascites. Musculoskeletal: No acute or significant osseous findings. IMPRESSION: Gallbladder dilatation is now noted with severe dilatation of common bile duct without definite evidence of obstructing calculus. Some degree of pancreatic ductal dilatation is noted as well. Possible pancreatic mass can not be excluded. Further evaluation with MRCP is recommended to evaluate for possible cause of obstruction. Aortic Atherosclerosis (ICD10-I70.0). Electronically Signed   By: Marijo Conception, M.D.   On: 10/29/2018 16:32   Mr Abdomen Mrcp Wo Contrast  Result Date: 10/30/2018 CLINICAL DATA:  Abdominal pain. Nausea and  vomiting. Dilated common bile duct and pancreatic ducts. EXAM: MRI ABDOMEN WITHOUT CONTRAST  (INCLUDING MRCP) TECHNIQUE: Multiplanar multisequence MR imaging of the abdomen was performed. Heavily T2-weighted images of the biliary and pancreatic ducts were obtained, and three-dimensional MRCP images were rendered by post processing. COMPARISON:  CT AP 10/29/2018 FINDINGS: Lower chest: No acute findings. Hepatobiliary: No focal liver abnormality identified. The gallbladder appears normal. No sludge or stones identified. Marked intrahepatic bile duct dilatation. The common bile duct measures 1.7 cm in maximum diameter. No choledocholithiasis or mass  identified. Pancreas: There is diffuse main duct dilatation to the level of the ampulla. This measures up to 8 mm. Within the limitations of unenhanced technique no discrete mass is identified. No pancreatic inflammation or free fluid identified. Spleen:  Within normal limits in size and appearance. Adrenals/Urinary Tract: The adrenal glands are unremarkable. No kidney mass or hydronephrosis identified. Stomach/Bowel: Visualized portions within the abdomen are unremarkable. Vascular/Lymphatic: Upper abdominal porta hepatic node measures 1.1 cm, image 15/8. No abdominal aortic aneurysm demonstrated. Other:  No free fluid or fluid collections. Musculoskeletal: No suspicious bone lesions identified. IMPRESSION: 1. Again seen is marked dilatation of the common bile duct and intrahepatic ducts. There is also significant dilatation of the main pancreatic duct up to the level of the ampulla. No obstructing stone or mass identified. A small ampullary tumor or stricture can not be excluded. Further investigation with ERCP is recommended. 2. Borderline enlarged porta hepatic node measures 1.1 cm. Electronically Signed   By: Kerby Moors M.D.   On: 10/30/2018 11:59   Mr 3d Recon At Scanner  Result Date: 10/30/2018 CLINICAL DATA:  Abdominal pain. Nausea and vomiting. Dilated  common bile duct and pancreatic ducts. EXAM: MRI ABDOMEN WITHOUT CONTRAST  (INCLUDING MRCP) TECHNIQUE: Multiplanar multisequence MR imaging of the abdomen was performed. Heavily T2-weighted images of the biliary and pancreatic ducts were obtained, and three-dimensional MRCP images were rendered by post processing. COMPARISON:  CT AP 10/29/2018 FINDINGS: Lower chest: No acute findings. Hepatobiliary: No focal liver abnormality identified. The gallbladder appears normal. No sludge or stones identified. Marked intrahepatic bile duct dilatation. The common bile duct measures 1.7 cm in maximum diameter. No choledocholithiasis or mass identified. Pancreas: There is diffuse main duct dilatation to the level of the ampulla. This measures up to 8 mm. Within the limitations of unenhanced technique no discrete mass is identified. No pancreatic inflammation or free fluid identified. Spleen:  Within normal limits in size and appearance. Adrenals/Urinary Tract: The adrenal glands are unremarkable. No kidney mass or hydronephrosis identified. Stomach/Bowel: Visualized portions within the abdomen are unremarkable. Vascular/Lymphatic: Upper abdominal porta hepatic node measures 1.1 cm, image 15/8. No abdominal aortic aneurysm demonstrated. Other:  No free fluid or fluid collections. Musculoskeletal: No suspicious bone lesions identified. IMPRESSION: 1. Again seen is marked dilatation of the common bile duct and intrahepatic ducts. There is also significant dilatation of the main pancreatic duct up to the level of the ampulla. No obstructing stone or mass identified. A small ampullary tumor or stricture can not be excluded. Further investigation with ERCP is recommended. 2. Borderline enlarged porta hepatic node measures 1.1 cm. Electronically Signed   By: Kerby Moors M.D.   On: 10/30/2018 11:59   US Abdomen Limited  Result Date: 10/29/2018 CLINICAL DATA:  Hepatitis with nausea and vomiting x5 days EXAM: ULTRASOUND ABDOMEN  LIMITED RIGHT UPPER QUADRANT COMPARISON:  Eight 6 2009 renal ultrasound, CT 02/17/2006 FINDINGS: Gallbladder: The gallbladder is distended without stones. No wall thickening or pericholecystic fluid is noted. Single wall thickness is 2.6 mm. Common bile duct: Diameter: Dilated to 12 mm without choledocholithiasis identified. No stricture nor mass is identified and there is no intrahepatic ductal dilatation suggesting that this may be ectatic as opposed to obstructive dilatation. Liver: No focal lesion identified. The left lobe was obscured by bowel gas. Normal echogenicity of the liver parenchyma. Portal vein is patent on color Doppler imaging with normal direction of blood flow towards the liver. IMPRESSION: 1. Homogeneous appearance of the liver without space-occupying mass. No  sonographic evidence of cirrhosis. 2. Physiologic distention of the gallbladder without stones. 3. Ectatic/dilated common bile duct without calculus. Electronically Signed   By: Ashley Royalty M.D.   On: 10/29/2018 14:27   Dg Chest Port 1 View  Result Date: 11/08/2018 CLINICAL DATA:  Hypotension. Weakness with nausea and vomiting since yesterday. Recent percutaneous biliary drain placement. EXAM: PORTABLE CHEST 1 VIEW COMPARISON:  04/10/2017 FINDINGS: Midline trachea. Normal heart size. Small to moderate hiatal hernia. Atherosclerosis in the transverse aorta. No pleural effusion or pneumothorax. Clear lungs. IMPRESSION: 1. No acute cardiopulmonary disease. 2. Hiatal hernia. 3. Aortic Atherosclerosis (ICD10-I70.0). Electronically Signed   By: Abigail Miyamoto M.D.   On: 11/08/2018 00:47   Dg C-arm 1-60 Min-no Report  Result Date: 10/31/2018 Fluoroscopy was utilized by the requesting physician.  No radiographic interpretation.   Ir Int Lianne Cure Biliary Drain With Cholangiogram  Result Date: 11/03/2018 INDICATION: Biliary obstruction. Ampullary lesion precluded endoscopic decompression. EXAM: PERCUTANEOUS TRANSHEPATIC CHOLANGIOGRAM  PERCUTANEOUS INTERNAL/EXTERNAL BILIARY DRAIN CATHETER PLACEMENT MEDICATIONS: Patient was already receiving adequate prophylactic antibiotic coverage as an inpatient. ANESTHESIA/SEDATION: Intravenous Fentanyl and Versed were administered as conscious sedation during continuous monitoring of the patient's level of consciousness and physiological / cardiorespiratory status by the radiology RN, with a total moderate sedation time of 45 minutes. PROCEDURE: Informed written consent was obtained from the patient after a thorough discussion of the procedural risks, benefits and alternatives. All questions were addressed. Maximal Sterile Barrier Technique was utilized including caps, mask, sterile gowns, sterile gloves, sterile drape, hand hygiene and skin antiseptic. A timeout was performed prior to the initiation of the procedure. Dilated intrahepatic biliary tree was identified on ultrasound and a peripheral left lower lobe duct was selected for approach. Skin and subcutaneous tissues down to the liver capsule infiltrated with 1% lidocaine. Under real-time ultrasound guidance, a 21 gauge micropuncture needle was advanced into the duct. A 018 guidewire advanced centrally. A 3 French micro dilator was placed. Small contrast injection under fluoroscopy confirmed appropriate positioning. Micro dilator exchanged over guidewire for a 5 French micropuncture dilator placed. Contrast injection for percutaneous transhepatic cholangiogram was performed. Catheter exchanged over an angled Glidewire for a 5 [place] Kumpe catheter, advanced to the distal CBD for additional cholangiographic images. The Kumpe was then advanced into the distal duodenum and exchanged over an Amplatz wire for vascular dilator which facilitated placement of a 10 French internal/external biliary drain catheter, placed with sideholes spanning the CBD obstruction, and holes in the distal duodenum. Confirmatory contrast injection under fluoroscopy performed. The  catheter was then flushed with saline and secured externally with 0 Prolene suture and StatLock, placed to external drain bag to maximize biliary decompression. The patient tolerated the procedure well. FLUOROSCOPY TIME:  3.3 minutes, 245  uGym2 DAP COMPLICATIONS: None immediate. FINDINGS: The percutaneous transhepatic cholangiogram confirms dilated intrahepatic biliary tree and CBD, down to the ampulla. Duodenum decompressed. 10 French internal/external biliary drainage catheter placed as above. IMPRESSION: 1. Distal CBD obstruction with intrahepatic and extrahepatic biliary ductal dilatation. 2. Technically successful internal/external biliary drain catheter placement. Electronically Signed   By: Lucrezia Europe M.D.   On: 11/03/2018 17:17    Labs:  CBC: Recent Labs    11/04/18 0642 11/05/18 0348 11/08/18 0022 11/08/18 0415  WBC 8.8 7.4 13.0* 14.8*  HGB 9.0* 8.7* 14.1 12.0  HCT 28.8* 27.5* 45.8 39.0  PLT 256 275 608* 409*    COAGS: Recent Labs    10/30/18 0906  INR 1.12    BMP: Recent Labs  11/05/18 0348 11/06/18 0343 11/08/18 0022 11/08/18 0415  NA 137 136 139 141  K 3.8 3.6 3.9 5.0  CL 101 100 96* 103  CO2 27 27 15* 19*  GLUCOSE 115* 122* 184* 79  BUN 7* 11 34* 36*  CALCIUM 9.1 9.1 10.1 8.4*  CREATININE 0.94 1.14* 6.04* 5.78*  GFRNONAA >60 48* 6* 7*  GFRAA >60 56* 7* 8*    LIVER FUNCTION TESTS: Recent Labs    11/05/18 0348 11/06/18 0343 11/08/18 0022 11/08/18 0415  BILITOT 2.1* 1.9* 2.5* 1.9*  AST 28 25 66* 59*  ALT 70* 63* 103* 89*  ALKPHOS 332* 322* 429* 296*  PROT 6.1* 6.3* 9.6* 7.6  ALBUMIN 2.1* 2.4* 3.8 2.9*    TUMOR MARKERS: No results for input(s): AFPTM, CEA, CA199, CHROMGRNA in the last 8760 hours.  Assessment and Plan:  Patient s/p internal/external biliary drain placement 11/03/18 by Dr. Vernard Gambles due to biliary obstruction. Patient d/ced to home on 1/17 however has had very poor PO intake associated with intractable nausea/vomiting, fever,  diaphoresis and weakness. She presented to [facility] ED today for same and was found to have leukocytosis, elevated lactic acid and AKI. She has been started on antibiotics, normal saline and is being admitted for further evaluation and management. Concern for drain malpositioning - request made to IR for drain evaluation.  Patient reports output from drain between 1-2 L QD, clear bilious output on exam today. Drain tubing, suture and stat lock are in place as well. No leakage of fluid from insertion site reported.  Will order KUB to assess drain is in appropriate position - if drain is in appropriate position may start capping trial to avoid further volume loss in the setting of dehydration/AKI. Will discuss further with on call IR MD after KUB resulted.  IR will continue to follow.   Please call with questions or concerns.  Thank you for this interesting consult.  I greatly enjoyed meeting Kimberly Vazquez and look forward to participating in their care.  A copy of this report was sent to the requesting provider on this date.  Electronically Signed: Joaquim Nam, PA-C 11/08/2018, 10:46 AM   I spent a total of 40 Minutes  in face to face in clinical consultation, greater than 50% of which was counseling/coordinating care for internal/external biliary drain follow up.

## 2018-11-08 NOTE — ED Notes (Signed)
Delay in lab draw,  MD at bedside. 

## 2018-11-09 DIAGNOSIS — K831 Obstruction of bile duct: Principal | ICD-10-CM

## 2018-11-09 LAB — URINE CULTURE: Culture: 60000 — AB

## 2018-11-09 LAB — COMPREHENSIVE METABOLIC PANEL
ALT: 81 U/L — ABNORMAL HIGH (ref 0–44)
ANION GAP: 15 (ref 5–15)
AST: 52 U/L — ABNORMAL HIGH (ref 15–41)
Albumin: 2.5 g/dL — ABNORMAL LOW (ref 3.5–5.0)
Alkaline Phosphatase: 232 U/L — ABNORMAL HIGH (ref 38–126)
BUN: 45 mg/dL — ABNORMAL HIGH (ref 8–23)
CO2: 15 mmol/L — AB (ref 22–32)
Calcium: 7.9 mg/dL — ABNORMAL LOW (ref 8.9–10.3)
Chloride: 108 mmol/L (ref 98–111)
Creatinine, Ser: 4.18 mg/dL — ABNORMAL HIGH (ref 0.44–1.00)
GFR calc Af Amer: 12 mL/min — ABNORMAL LOW (ref >60)
GFR calc non Af Amer: 10 mL/min — ABNORMAL LOW (ref >60)
Glucose, Bld: 75 mg/dL (ref 70–99)
Potassium: 4 mmol/L (ref 3.5–5.1)
SODIUM: 138 mmol/L (ref 135–145)
Total Bilirubin: 1.7 mg/dL — ABNORMAL HIGH (ref 0.3–1.2)
Total Protein: 6.6 g/dL (ref 6.5–8.1)

## 2018-11-09 LAB — CBC
HCT: 29.3 % — ABNORMAL LOW (ref 36.0–46.0)
Hemoglobin: 9.1 g/dL — ABNORMAL LOW (ref 12.0–15.0)
MCH: 24.9 pg — ABNORMAL LOW (ref 26.0–34.0)
MCHC: 31.1 g/dL (ref 30.0–36.0)
MCV: 80.3 fL (ref 80.0–100.0)
Platelets: 360 10*3/uL (ref 150–400)
RBC: 3.65 MIL/uL — ABNORMAL LOW (ref 3.87–5.11)
RDW: 16.4 % — ABNORMAL HIGH (ref 11.5–15.5)
WBC: 9.7 10*3/uL (ref 4.0–10.5)
nRBC: 0 % (ref 0.0–0.2)

## 2018-11-09 LAB — PHOSPHORUS: Phosphorus: 4.6 mg/dL (ref 2.5–4.6)

## 2018-11-09 MED ORDER — LACTATED RINGERS IV SOLN
INTRAVENOUS | Status: DC
  Administered 2018-11-09 (×2): via INTRAVENOUS

## 2018-11-09 MED ORDER — BOOST / RESOURCE BREEZE PO LIQD CUSTOM
1.0000 | Freq: Three times a day (TID) | ORAL | Status: DC
  Administered 2018-11-09 (×2): 1 via ORAL

## 2018-11-09 NOTE — Progress Notes (Signed)
Supervising Physician: Markus Daft  Patient Status: [facility] - In-pt  Subjective: S/p I/E bili drain 1/14. Readmitted yesterday with AKI Feeling better today. Drain was capped yesterday after flushing easily.  Objective: Physical Exam: BP 120/63 (BP Location: Left Arm)   Pulse 83   Temp 98.2 F (36.8 C) (Oral)   Resp 16   Ht 5' 1.5" (1.562 m)   Wt 82.9 kg   SpO2 100%   BMI 33.97 kg/m  Drain intact, site clean, NT   Current Facility-Administered Medications:  .  acetaminophen (TYLENOL) tablet 650 mg, 650 mg, Oral, Q6H PRN **OR** acetaminophen (TYLENOL) suppository 650 mg, 650 mg, Rectal, Q6H PRN, Chundi, Vahini, MD .  feeding supplement (ENSURE ENLIVE) (ENSURE ENLIVE) liquid 237 mL, 237 mL, Oral, BID BM, Annia Belt, MD .  heparin injection 5,000 Units, 5,000 Units, Subcutaneous, Q8H, Chundi, Vahini, MD, 5,000 Units at 11/09/18 [phone] .  lactated ringers infusion, , Intravenous, Continuous, Bloomfield, Carley D, DO, Last Rate: 125 mL/hr at 11/09/18 3086 .  mometasone-formoterol (DULERA) 200-5 MCG/ACT inhaler 2 puff, 2 puff, Inhalation, BID, Chundi, Vahini, MD .  nortriptyline (PAMELOR) capsule 25 mg, 25 mg, Oral, QHS, Chundi, Vahini, MD, 25 mg at 11/08/18 2242 .  promethazine (PHENERGAN) tablet 12.5 mg, 12.5 mg, Oral, Q6H PRN, Chundi, Vahini, MD, 12.5 mg at 11/08/18 1825 .  senna-docusate (Senokot-S) tablet 1 tablet, 1 tablet, Oral, QHS PRN, Lars Mage, MD  Labs: CBC Recent Labs    11/08/18 0415 11/09/18 0417  WBC 14.8* 9.7  HGB 12.0 9.1*  HCT 39.0 29.3*  PLT 409* 360   BMET Recent Labs    11/08/18 1627 11/09/18 0417  NA 140 138  K 4.3 4.0  CL 108 108  CO2 18* 15*  GLUCOSE 73 75  BUN 38* 45*  CREATININE 4.94* 4.18*  CALCIUM 8.0* 7.9*   LFT Recent Labs    11/09/18 0417  PROT 6.6  ALBUMIN 2.5*  AST 52*  ALT 81*  ALKPHOS 232*  BILITOT 1.7*   PT/INR No results for input(s): LABPROT, INR in the last 72 hours.   Studies/Results: Dg Abd  1 View  Result Date: 11/08/2018 CLINICAL DATA:  Biliary drain placement with vomiting EXAM: ABDOMEN - 1 VIEW COMPARISON:  CT abdomen and pelvis October 29, 2018; biliary drain placement images November 03, 2018 FINDINGS: Biliary drain is seen in the medial right mid abdomen, not felt to be appreciably changed from most recent prior study. There is moderate stool in the colon. There is no bowel dilatation or air-fluid level to suggest bowel obstruction. No free air. IMPRESSION: Biliary drain appears to extend to the level of the duodenum. Position is felt to be essentially stable compared to 5 days prior. No bowel obstruction or free air evident. Electronically Signed   By: Lowella Grip III M.D.   On: 11/08/2018 11:55   Dg Chest Port 1 View  Result Date: 11/08/2018 CLINICAL DATA:  Hypotension. Weakness with nausea and vomiting since yesterday. Recent percutaneous biliary drain placement. EXAM: PORTABLE CHEST 1 VIEW COMPARISON:  04/10/2017 FINDINGS: Midline trachea. Normal heart size. Small to moderate hiatal hernia. Atherosclerosis in the transverse aorta. No pleural effusion or pneumothorax. Clear lungs. IMPRESSION: 1. No acute cardiopulmonary disease. 2. Hiatal hernia. 3. Aortic Atherosclerosis (ICD10-I70.0). Electronically Signed   By: Abigail Miyamoto M.D.   On: 11/08/2018 00:47    Assessment/Plan: S/p I/E bili drain 1/14 Drain function well, pt tolerating capping. Bili down to 1.7    LOS: 1  day   I spent a total of 15 minutes in face to face in clinical consultation, greater than 50% of which was counseling/coordinating care for bili drain  Ascencion Dike PA-C 11/09/2018 10:02 AM

## 2018-11-09 NOTE — Progress Notes (Signed)
Initial Nutrition Assessment  DOCUMENTATION CODES:   Severe malnutrition in context of acute illness/injury  INTERVENTION:   - Ordered Boost Breeze TID (each contains 250 kcal and 9 gr protein) - D/c Ensure Enlive per pt request; may resume when nausea abates   NUTRITION DIAGNOSIS:   Severe Malnutrition related to acute illness as evidenced by energy intake < or equal to 50% for > or equal to 5 days, percent weight loss.   GOAL:   Patient will meet greater than or equal to 90% of their needs   MONITOR:   PO intake, Diet advancement, Supplement acceptance, Labs, I & O's, Weight trends  REASON FOR ASSESSMENT:   Malnutrition Screening Tool    ASSESSMENT:   73 yo female, admitted with acute renal failure. PMH significant for SLE, HTN, recent admission to [facility] for obstructive jaundice.  1/11 ERCP found duodenal polyp 1/14 Per transhepatic biliary drain catheter 1/17 d/c from [facility] 1/19 Pt returned to [facility] ED with weakness  Labs: BUN 45, Creatinine 4.18, ALP 232, AST 52, ALT 81, tBili 1.7, GFR 10%/12%, Hgb 9.1, Hct 29.3% Meds: Ensure Enlive BID, LR at 125 mL/hr  Pt asleep at time of visit, woke easily when addressed. Daughter present at bedside.  Pt reports poor appetite for the past 8 days. Pt has experienced nausea and vomiting for the past 2 weeks, and had the flu right before her recent hospital admissions. When well, pt typically eats 3 meals daily. Does not follow any special diet. Takes MVI and fish oil supplements.  Per chart, pt wt down 8.4 kg over 1 week --> 9.2% is clinically significant. Family and pt acknowledge that wt loss and attribute it in part to fluid loss, vomiting.  Pt denies difficulty chewing or swallowing, constipation, or diarrhea. Last BM Fri 1/17, per pt.   Encouraged pt to include protein-rich foods with all meals and snacks, and to eat those foods first if not feeling hungry. Pt has Ensure Enlive ordered, but would prefer to receive Boost Breeze  given continued n/v.    NUTRITION - FOCUSED PHYSICAL EXAM on 11/06/18: Deferred today given physical discomfort   Most Recent Value  Orbital Region  No depletion  Upper Arm Region  No depletion  Thoracic and Lumbar Region  No depletion  Buccal Region  No depletion  Temple Region  No depletion  Clavicle Bone Region  No depletion  Clavicle and Acromion Bone Region  No depletion  Scapular Bone Region  No depletion  Dorsal Hand  Mild depletion  Patellar Region  No depletion  Anterior Thigh Region  No depletion  Posterior Calf Region  No depletion  Edema (RD Assessment)  None  Hair  Reviewed  Eyes  Reviewed  Mouth  Reviewed  Skin  Reviewed  Nails  Reviewed     Diet Order:  No intake noted, per nsg documentation Diet Order            Diet renal with fluid restriction Fluid restriction: 1200 mL Fluid; Room service appropriate? Yes; Fluid consistency: Thin  Diet effective now              EDUCATION NEEDS:   Not appropriate for education at this time  Skin:  Skin Assessment: Reviewed RN Assessment  Last BM:  1/18  Height:   Ht Readings from Last 1 Encounters:  11/08/18 5' 1.5" (1.562 m)    Weight:   Wt Readings from Last 1 Encounters:  11/09/18 82.9 kg    Ideal Body Weight:  48.8 kg  BMI:  Body mass index is 33.97 kg/m.  Estimated Nutritional Needs:   Kcal:  1675 calories daily (MSJ x 1.3)  Protein:  60-75 gm daily  Fluid:  1200 mL  Althea Grimmer, MS, RDN, LDN Pager: [phone] Available Mondays and Fridays, 9am-2pm

## 2018-11-09 NOTE — Progress Notes (Signed)
Medicine attending: I examined this patient today together with resident physician Modena Nunnery and I concur with her evaluation and management plan. Patient symptomatically improved with aggressive hydration.  Trend for improvement in her creatinine but BUN up.  We believe she likely had some acute renal injury related to dehydration. We discussed biliary drain management with interventional radiologist.  Drain now capped. Bilirubin down to 1.7 and transaminases continue to improve.  Will need to monitor.  Open drain if bilirubin starts to rise again. We will continue parenteral hydration until clear trend for further improvement in renal function before hospital discharge.

## 2018-11-09 NOTE — Progress Notes (Signed)
   Subjective: Kimberly Vazquez was seen and evaluated at bedside. No acute events overnight. She did not sleep well due to her mind racing. She endorses improvement in her appetite yesterday and was able to eat a small amount. She denies fevers, chills, abdominal pain, nausea or vomiting. She I&O cathed x2 yesterday without difficulty.   Objective:  Vital signs in last 24 hours: Vitals:   11/08/18 1354 11/08/18 2018 11/08/18 2241 11/09/18 0547  BP: 138/68 (!) 107/58  120/63  Pulse: 67 98  83  Resp: 18 16  16   Temp: 97.9 F (36.6 C) 98.4 F (36.9 C)  98.2 F (36.8 C)  TempSrc: Oral Oral  Oral  SpO2: 100% 100%  100%  Weight:    82.9 kg  Height:   5' 1.5" (1.562 m)    General: awake, alert, lying in bed in NAD CV: RRR Pulm: Normal respiratory effort; lungs CTA bilaterally Abd: BS+; Abdomen is soft, non-tender, non-distended; biliary drain capped, dressing clean, dry intact   Assessment/Plan:  Principal Problem:   Acute renal failure (ARF) (HCC) Active Problems:   Dehydration   Obstructive jaundice   Tubulovillous adenoma of small intestine   Essential hypertension  1. Acute renal failure 2/2 dehydration and likely ATN from hypotension:  - urine output improved - electrolytes remain stable; creatinine trending down with IVF; suspect it will take up to 2 weeks for creatinine to normalize due to ATN - continue bladder scans q 8 hours and I&O caths BID - will continue IVF until PO intake improves    2. Obstructive jaundice 2/2 tubulovillous adenoma with HGD s/p PCT biliary drain on 1/14:  - IR capped drain yesterday, and patient's bilirubin has remained stable - antibiotics have been discontinued; patient is afebrile without pain on exam, and leukocytosis has resolved. Suspect this was reactionary from acute renal failure.   - appreciate IR continuing to follow as we monitor CMP with capped drain   # HTN: holding home medication in the setting of acute renal failure and softer  blood pressures  # SLE: Holding Plaquenil in the setting of acute renal failure.   Dispo: Anticipated discharge pending improvement in renal function and stable bilirubin with capped biliary drain.   Kimberly Vazquez D, DO 11/09/2018, 10:23 AM Pager: [phone]

## 2018-11-10 DIAGNOSIS — Z978 Presence of other specified devices: Secondary | ICD-10-CM

## 2018-11-10 DIAGNOSIS — N170 Acute kidney failure with tubular necrosis: Secondary | ICD-10-CM

## 2018-11-10 DIAGNOSIS — D1330 Benign neoplasm of unspecified part of small intestine: Secondary | ICD-10-CM

## 2018-11-10 LAB — COMPREHENSIVE METABOLIC PANEL
ALK PHOS: 208 U/L — AB (ref 38–126)
ALT: 65 U/L — ABNORMAL HIGH (ref 0–44)
ANION GAP: 9 (ref 5–15)
AST: 37 U/L (ref 15–41)
Albumin: 2.3 g/dL — ABNORMAL LOW (ref 3.5–5.0)
BUN: 39 mg/dL — ABNORMAL HIGH (ref 8–23)
CO2: 21 mmol/L — ABNORMAL LOW (ref 22–32)
Calcium: 8.3 mg/dL — ABNORMAL LOW (ref 8.9–10.3)
Chloride: 108 mmol/L (ref 98–111)
Creatinine, Ser: 2.58 mg/dL — ABNORMAL HIGH (ref 0.44–1.00)
GFR calc Af Amer: 21 mL/min — ABNORMAL LOW (ref >60)
GFR calc non Af Amer: 18 mL/min — ABNORMAL LOW (ref >60)
Glucose, Bld: 89 mg/dL (ref 70–99)
Potassium: 3.6 mmol/L (ref 3.5–5.1)
Sodium: 138 mmol/L (ref 135–145)
Total Bilirubin: 1.7 mg/dL — ABNORMAL HIGH (ref 0.3–1.2)
Total Protein: 5.8 g/dL — ABNORMAL LOW (ref 6.5–8.1)

## 2018-11-10 MED ORDER — ENOXAPARIN SODIUM 30 MG/0.3ML ~~LOC~~ SOLN
30.0000 mg | SUBCUTANEOUS | Status: DC
  Administered 2018-11-10: 30 mg via SUBCUTANEOUS
  Filled 2018-11-10: qty 0.3

## 2018-11-10 MED ORDER — HYDROXYCHLOROQUINE SULFATE 200 MG PO TABS
200.0000 mg | ORAL_TABLET | ORAL | Status: DC
  Administered 2018-11-10 – 2018-11-11 (×3): 200 mg via ORAL
  Filled 2018-11-10 (×3): qty 1

## 2018-11-10 NOTE — Progress Notes (Signed)
Referring Physician(s): Dr. Beryle Beams  Supervising Physician: Arne Cleveland  Patient Status:  [facility] - In-pt  Chief Complaint:  Jaundice, biliary obstruction,duodenal mass Technically successful internal/external biliary drain catheter placement on 11/03/18. Readmitted with worsening renal function due to excessive output from biliary drain.  Subjective:  Patient looks and feels much better today. Tolerating drain capping well so far.  Allergies: Patient has no known allergies.  Medications: Prior to Admission medications   Medication Sig Start Date End Date Taking? Authorizing Provider  budesonide-formoterol (SYMBICORT) 160-4.5 MCG/ACT inhaler Inhale 2 puffs into the lungs 2 (two) times daily as needed (cough/congestion).   Yes [provider]  cholecalciferol (VITAMIN D) 1000 UNITS tablet Take 1,000 Units by mouth daily.    Yes [provider]  hydroxychloroquine (PLAQUENIL) 200 MG tablet TAKE ONE TABLET BY MOUTH TWICE A DAY MONDAY THROUGH FRIDAY. Patient taking differently: Take 200 mg by mouth See admin instructions. Take one tablet (200 mg) by mouth twice daily on Monday thru Friday (skip Saturday and Sunday) 10/22/18  Yes Deveshwar, Abel Presto, MD  Multiple Vitamin (MULTIVITAMIN WITH MINERALS) TABS tablet Take 1 tablet by mouth daily.   Yes [provider]  naproxen sodium (ALEVE) 220 MG tablet Take 440 mg by mouth daily as needed (pain/headache).   Yes [provider]  nitrofurantoin (MACRODANTIN) 50 MG capsule Take 50 mg by mouth daily.    Yes [provider]  nortriptyline (PAMELOR) 25 MG capsule TAKE 1 CAPSULE (25 MG TOTAL) BY MOUTH AT BEDTIME. Patient taking differently: Take 25 mg by mouth daily.  10/31/17  Yes Jessy Oto, MD  Omega-3 Fatty Acids (FISH OIL) 1000 MG CPDR Take 1,000 mg by mouth daily.    Yes [provider]  ondansetron (ZOFRAN ODT) 4 MG disintegrating tablet Take 1 tablet (4 mg total) by mouth every  8 (eight) hours as needed for nausea or vomiting. 11/06/18  Yes Bloomfield, Carley D, DO  OVER THE COUNTER MEDICATION Take 1 capsule by mouth daily. FlameRX - proprietary blend of turmeric, resveratrol, B vitamins   Yes [provider]  Polyethyl Glycol-Propyl Glycol (SYSTANE OP) Place 1 drop into both eyes daily as needed (dry eyes).   Yes [provider]  tolnaftate (TINACTIN) 1 % cream Apply 1 application topically 2 (two) times daily as needed (foot fungus).    Yes [provider]     Vital Signs: BP 132/72 (BP Location: Left Arm)   Pulse 80   Temp 98 F (36.7 C) (Oral)   Resp 16   Ht 5' 1.5" (1.562 m)   Wt 96.3 kg   SpO2 100%   BMI 39.46 kg/m   Physical Exam Awake and alert NAD Lying in bed. Drain remains capped No abdominal tenderness.  Imaging: Dg Abd 1 View  Result Date: 11/08/2018 CLINICAL DATA:  Biliary drain placement with vomiting EXAM: ABDOMEN - 1 VIEW COMPARISON:  CT abdomen and pelvis October 29, 2018; biliary drain placement images November 03, 2018 FINDINGS: Biliary drain is seen in the medial right mid abdomen, not felt to be appreciably changed from most recent prior study. There is moderate stool in the colon. There is no bowel dilatation or air-fluid level to suggest bowel obstruction. No free air. IMPRESSION: Biliary drain appears to extend to the level of the duodenum. Position is felt to be essentially stable compared to 5 days prior. No bowel obstruction or free air evident. Electronically Signed   By: Lowella Grip III M.D.   On:  11/08/2018 11:55   Dg Chest Port 1 View  Result Date: 11/08/2018 CLINICAL DATA:  Hypotension. Weakness with nausea and vomiting since yesterday. Recent percutaneous biliary drain placement. EXAM: PORTABLE CHEST 1 VIEW COMPARISON:  04/10/2017 FINDINGS: Midline trachea. Normal heart size. Small to moderate hiatal hernia. Atherosclerosis in the transverse aorta. No pleural effusion or pneumothorax. Clear  lungs. IMPRESSION: 1. No acute cardiopulmonary disease. 2. Hiatal hernia. 3. Aortic Atherosclerosis (ICD10-I70.0). Electronically Signed   By: Abigail Miyamoto M.D.   On: 11/08/2018 00:47    Labs:  CBC: Recent Labs    11/05/18 0348 11/08/18 0022 11/08/18 0415 11/09/18 0417  WBC 7.4 13.0* 14.8* 9.7  HGB 8.7* 14.1 12.0 9.1*  HCT 27.5* 45.8 39.0 29.3*  PLT 275 608* 409* 360    COAGS: Recent Labs    10/30/18 0906  INR 1.12    BMP: Recent Labs    11/08/18 0415 11/08/18 1627 11/09/18 0417 11/10/18 0441  NA 141 140 138 138  K 5.0 4.3 4.0 3.6  CL 103 108 108 108  CO2 19* 18* 15* 21*  GLUCOSE 79 73 75 89  BUN 36* 38* 45* 39*  CALCIUM 8.4* 8.0* 7.9* 8.3*  CREATININE 5.78* 4.94* 4.18* 2.58*  GFRNONAA 7* 8* 10* 18*  GFRAA 8* 9* 12* 21*    LIVER FUNCTION TESTS: Recent Labs    11/08/18 0022 11/08/18 0415 11/08/18 1627 11/09/18 0417 11/10/18 0441  BILITOT 2.5* 1.9*  --  1.7* 1.7*  AST 66* 59*  --  52* 37  ALT 103* 89*  --  81* 65*  ALKPHOS 429* 296*  --  232* 208*  PROT 9.6* 7.6  --  6.6 5.8*  ALBUMIN 3.8 2.9* 2.6* 2.5* 2.3*    Assessment and Plan:  Jaundice, biliary obstruction,duodenal mass  Technically successful internal/external biliary drain catheter placement.  Now capped due to excessive output causing acute renal failure.  Tbili stable at 1.7.  If patient develops worsening jaundice/icterus, or abdominal pain, can put back to gravity bag.  Hopefully discharge tomorrow per IM note.   Electronically Signed: Murrell Redden, PA-C 11/10/2018, 1:03 PM    I spent a total of 15 Minutes at the the patient's bedside AND on the patient's hospital floor or unit, greater than 50% of which was counseling/coordinating care for f/u biliary drain.

## 2018-11-10 NOTE — Progress Notes (Signed)
ANTICOAGULATION CONSULT NOTE - Initial Consult  Pharmacy Consult for enoxparin Indication: VTE prophylaxis  No Known Allergies  Patient Measurements: Height: 5' 1.5" (156.2 cm) Weight: 212 lb 4.9 oz (96.3 kg) IBW/kg (Calculated) : 48.95   Vital Signs: Temp: 98 F (36.7 C) (01/21 0955) Temp Source: Oral (01/21 0955) BP: 136/77 (01/21 0955) Pulse Rate: 80 (01/21 0955)  Labs: Recent Labs    11/08/18 0022 11/08/18 0415 11/08/18 1627 11/09/18 0417 11/10/18 0441  HGB 14.1 12.0  --  9.1*  --   HCT 45.8 39.0  --  29.3*  --   PLT 608* 409*  --  360  --   CREATININE 6.04* 5.78* 4.94* 4.18* 2.58*  TROPONINI 0.03*  --   --   --   --     Estimated Creatinine Clearance: 21.1 mL/min (A) (by C-G formula based on SCr of 2.58 mg/dL (H)).   Medical History: Past Medical History:  Diagnosis Date  . Allergic rhinitis   . Anemia   . Arthritis   . Bladder disorder    "lazy" , caths twice a day  . Depression   . Fatty liver   . GERD (gastroesophageal reflux disease)   . Hypertension   . Seasonal allergies   . Systemic lupus erythematosus ([place])   . UTI (urinary tract infection)   . Vitamin D deficiency      Assessment: 73 yo female with AKI that is now resoving ([place] 2.58) CrCl still <30 ml/min. Pharmacy consulted for enoxaparin for VTE prophylaxis.    Goal of Therapy:  Monitor platelets by anticoagulation protocol: Yes   Plan:  Enoxaparin 30mg  SQ q24h Monitor for renal function changes and bleeding.   Gorgeous [place] A. Levada Dy, PharmD, [place] Pager: [phone] Please utilize Amion for appropriate phone number to reach the unit pharmacist ([place])   11/10/2018,10:59 AM

## 2018-11-10 NOTE — Progress Notes (Signed)
PT refuse bladder scan, stated that she know when she has urine retention so after cath herself she is ok and don't need to be bladder scan. Educated pt.

## 2018-11-10 NOTE — Progress Notes (Signed)
   Subjective: Kimberly Vazquez was seen and evaluated at bedside. No acute events overnight. She is feeling much better today. Her appetite is increasing, and she is tolerating PO intake well. Denies nausea, vomiting, or abdominal pain. She is having good urine output with self-caths. She has not had a BM in 2 days and would like to start with prune juice to help her with this. Discussed that if she continues to do well with eating today that she can likely go home tomorrow and follow-up with GI, general surgery as scheduled.    Objective:  Vital signs in last 24 hours: Vitals:   11/09/18 1519 11/09/18 2111 11/10/18 0646 11/10/18 0726  BP: 137/84 140/75 (!) 147/86   Pulse: 92 84 78   Resp: 16 16    Temp:  98.1 F (36.7 C) 97.9 F (36.6 C)   TempSrc:  Oral    SpO2: 100% 99% 100% 98%  Weight:   96.3 kg   Height:       General: awake, alert, sitting up in her chair in NAD CV: RRR Abd: BS+; abdomen is soft, non-distended, non-tender   Assessment/Plan:  Principal Problem:   Acute renal failure (ARF) (HCC) Active Problems:   Dehydration   Obstructive jaundice   Biliary obstruction   Tubulovillous adenoma of small intestine   Essential hypertension  1. Acute renal failure 2/2 dehydration and likely ATN from hypotension:  - having good urine output  - bicarb improved and creatinine has improved to 2.58 with IVF; will likely return to baseline within the next week or 2  - continue bladder scans q 8 hours and I&O caths BID - will d/c IVF since she is tolerating PO intake and monitor for another day   2. Obstructive jaundice 2/2 tubulovillous adenoma with HGD s/p PCT biliary drain on 1/14:  - bilirubin has remained stable with drain capped x 48 hours  - blood cx NGTD; leukocytosis resolved; this was reactionary from acute renal failure  - appreciate IR following - will plan to monitor another day with capped drain prior to discharge with close follow-up   3. HTN: blood pressure  stable; Triamterene-HCTZ was held at discharge which we have continued to hold in the setting of AKI   4. SLE: will resume Plaquenil today given improved renal function   Dispo: Anticipated discharge in approximately 1 day(s).   Modena Nunnery D, DO 11/10/2018, 9:43 AM Pager: [phone]

## 2018-11-11 DIAGNOSIS — R1110 Vomiting, unspecified: Secondary | ICD-10-CM

## 2018-11-11 DIAGNOSIS — E43 Unspecified severe protein-calorie malnutrition: Secondary | ICD-10-CM

## 2018-11-11 LAB — COMPREHENSIVE METABOLIC PANEL
ALK PHOS: 203 U/L — AB (ref 38–126)
ALT: 56 U/L — AB (ref 0–44)
AST: 34 U/L (ref 15–41)
Albumin: 2.6 g/dL — ABNORMAL LOW (ref 3.5–5.0)
Anion gap: 9 (ref 5–15)
BUN: 31 mg/dL — ABNORMAL HIGH (ref 8–23)
CALCIUM: 8.3 mg/dL — AB (ref 8.9–10.3)
CO2: 21 mmol/L — ABNORMAL LOW (ref 22–32)
Chloride: 108 mmol/L (ref 98–111)
Creatinine, Ser: 1.72 mg/dL — ABNORMAL HIGH (ref 0.44–1.00)
GFR calc Af Amer: 34 mL/min — ABNORMAL LOW (ref >60)
GFR calc non Af Amer: 29 mL/min — ABNORMAL LOW (ref >60)
Glucose, Bld: 89 mg/dL (ref 70–99)
Potassium: 3.8 mmol/L (ref 3.5–5.1)
Sodium: 138 mmol/L (ref 135–145)
Total Bilirubin: 1.5 mg/dL — ABNORMAL HIGH (ref 0.3–1.2)
Total Protein: 6.4 g/dL — ABNORMAL LOW (ref 6.5–8.1)

## 2018-11-11 MED ORDER — NORMAL SALINE FLUSH 0.9 % IV SOLN: 5.0000 mL | Freq: Every day | INTRAVENOUS | 0 refills | Status: AC

## 2018-11-11 NOTE — Progress Notes (Signed)
Nsg Discharge Note  Admit Date:  11/08/2018 Discharge date: 11/11/2018   Kimberly Vazquez to be D/C'd Home per MD order.  AVS completed.  Copy for chart, and copy for patient signed, and dated. Patient/caregiver able to verbalize understanding.  Discharge Medication: Allergies as of 11/11/2018   No Known Allergies     Medication List    STOP taking these medications   naproxen sodium 220 MG tablet Commonly known as:  ALEVE     TAKE these medications   budesonide-formoterol 160-4.5 MCG/ACT inhaler Commonly known as:  SYMBICORT Inhale 2 puffs into the lungs 2 (two) times daily as needed (cough/congestion).   cholecalciferol 1000 units tablet Commonly known as:  VITAMIN D Take 1,000 Units by mouth daily.   Fish Oil 1000 MG Cpdr Take 1,000 mg by mouth daily.   hydroxychloroquine 200 MG tablet Commonly known as:  PLAQUENIL TAKE ONE TABLET BY MOUTH TWICE A DAY Kimberly Vazquez. What changed:    how much to take  how to take this  when to take this  additional instructions   multivitamin with minerals Tabs tablet Take 1 tablet by mouth daily.   nitrofurantoin 50 MG capsule Commonly known as:  MACRODANTIN Take 50 mg by mouth daily.   nortriptyline 25 MG capsule Commonly known as:  PAMELOR TAKE 1 CAPSULE (25 MG TOTAL) BY MOUTH AT BEDTIME. What changed:  when to take this   ondansetron 4 MG disintegrating tablet Commonly known as:  ZOFRAN ODT Take 1 tablet (4 mg total) by mouth every 8 (eight) hours as needed for nausea or vomiting.   OVER THE COUNTER MEDICATION Take 1 capsule by mouth daily. FlameRX - proprietary blend of turmeric, resveratrol, B vitamins   SYSTANE OP Place 1 drop into both eyes daily as needed (dry eyes).   tolnaftate 1 % cream Commonly known as:  TINACTIN Apply 1 application topically 2 (two) times daily as needed (foot fungus).       Discharge Assessment: Vitals:   11/11/18 0516 11/11/18 0836  BP: (!) 144/80   Pulse: 80    Resp: 20   Temp: 98.2 F (36.8 C)   SpO2: 97% 97%   Skin clean, dry and intact without evidence of skin break down, no evidence of skin tears noted. IV catheter discontinued intact. Site without signs and symptoms of complications - no redness or edema noted at insertion site, patient denies c/o pain - only slight tenderness at site.  Dressing with slight pressure applied.  D/c Instructions-Education: Discharge instructions given to patient/family with verbalized understanding. D/c education completed with patient/family including follow up instructions, medication list, d/c activities limitations if indicated, with other d/c instructions as indicated by MD - patient able to verbalize understanding, all questions fully answered. Patient instructed to return to ED, call 911, or call MD for any changes in condition.  Patient escorted via [place], and D/C home via private auto.  Erasmo Leventhal, RN 11/11/2018 10:45 AM

## 2018-11-11 NOTE — Progress Notes (Signed)
Referring Physician(s): Dr. Beryle Beams  Supervising Physician: Marybelle Killings  Patient Status:  [facility] - In-pt  Chief Complaint: Follow up biliary drain.  Subjective:  Patient sitting at sink using laptop, fully dressed and ready to go home. Denies any complaints today - is not excited about having to flush drain. Daughter at bedside states will be helping with drain care.   Allergies: Patient has no known allergies.  Medications: Prior to Admission medications   Medication Sig Start Date End Date Taking? Authorizing Provider  budesonide-formoterol (SYMBICORT) 160-4.5 MCG/ACT inhaler Inhale 2 puffs into the lungs 2 (two) times daily as needed (cough/congestion).   Yes [provider]  cholecalciferol (VITAMIN D) 1000 UNITS tablet Take 1,000 Units by mouth daily.    Yes [provider]  hydroxychloroquine (PLAQUENIL) 200 MG tablet TAKE ONE TABLET BY MOUTH TWICE A DAY MONDAY THROUGH FRIDAY. Patient taking differently: Take 200 mg by mouth See admin instructions. Take one tablet (200 mg) by mouth twice daily on Monday thru Friday (skip Saturday and Sunday) 10/22/18  Yes Deveshwar, Abel Presto, MD  Multiple Vitamin (MULTIVITAMIN WITH MINERALS) TABS tablet Take 1 tablet by mouth daily.   Yes [provider]  naproxen sodium (ALEVE) 220 MG tablet Take 440 mg by mouth daily as needed (pain/headache).   Yes [provider]  nitrofurantoin (MACRODANTIN) 50 MG capsule Take 50 mg by mouth daily.    Yes [provider]  nortriptyline (PAMELOR) 25 MG capsule TAKE 1 CAPSULE (25 MG TOTAL) BY MOUTH AT BEDTIME. Patient taking differently: Take 25 mg by mouth daily.  10/31/17  Yes Jessy Oto, MD  Omega-3 Fatty Acids (FISH OIL) 1000 MG CPDR Take 1,000 mg by mouth daily.    Yes [provider]  ondansetron (ZOFRAN ODT) 4 MG disintegrating tablet Take 1 tablet (4 mg total) by mouth every 8 (eight) hours as needed for nausea or vomiting. 11/06/18  Yes  [place], Carley D, DO  OVER THE COUNTER MEDICATION Take 1 capsule by mouth daily. FlameRX - proprietary blend of turmeric, resveratrol, B vitamins   Yes [provider]  Polyethyl Glycol-Propyl Glycol (SYSTANE OP) Place 1 drop into both eyes daily as needed (dry eyes).   Yes [provider]  tolnaftate (TINACTIN) 1 % cream Apply 1 application topically 2 (two) times daily as needed (foot fungus).    Yes [provider]     Vital Signs: BP (!) 144/80 (BP Location: Left Arm)   Pulse 80   Temp 98.2 F (36.8 C) (Oral)   Resp 20   Ht 5' 1.5" (1.562 m)   Wt 212 lb 4.9 oz (96.3 kg)   SpO2 97%   BMI 39.46 kg/m   Physical Exam Vitals signs and nursing note reviewed.  Constitutional:      General: She is not in acute distress.    Appearance: She is not ill-appearing.  HENT:     Head: Normocephalic.  Cardiovascular:     Rate and Rhythm: Normal rate.  Pulmonary:     Effort: Pulmonary effort is normal.  Abdominal:     Comments: (+) RUQ drain in place, capped. Flushes easily during exam. Insertion site clean, dry, dressed appropriately without erythema, edema, drainage or active bleeding.    Skin:    General: Skin is warm and dry.     Coloration: Skin is not jaundiced.  Neurological:     Mental Status: She is alert. Mental status is at baseline.  Psychiatric:  Mood and Affect: Mood normal.        Behavior: Behavior normal.        Thought Content: Thought content normal.        Judgment: Judgment normal.     Imaging: Dg Abd 1 View  Result Date: 11/08/2018 CLINICAL DATA:  Biliary drain placement with vomiting EXAM: ABDOMEN - 1 VIEW COMPARISON:  CT abdomen and pelvis October 29, 2018; biliary drain placement images November 03, 2018 FINDINGS: Biliary drain is seen in the medial right mid abdomen, not felt to be appreciably changed from most recent prior study. There is moderate stool in the colon. There is no bowel dilatation or air-fluid level to  suggest bowel obstruction. No free air. IMPRESSION: Biliary drain appears to extend to the level of the duodenum. Position is felt to be essentially stable compared to 5 days prior. No bowel obstruction or free air evident. Electronically Signed   By: Lowella Grip III M.D.   On: 11/08/2018 11:55   Dg Chest Port 1 View  Result Date: 11/08/2018 CLINICAL DATA:  Hypotension. Weakness with nausea and vomiting since yesterday. Recent percutaneous biliary drain placement. EXAM: PORTABLE CHEST 1 VIEW COMPARISON:  04/10/2017 FINDINGS: Midline trachea. Normal heart size. Small to moderate hiatal hernia. Atherosclerosis in the transverse aorta. No pleural effusion or pneumothorax. Clear lungs. IMPRESSION: 1. No acute cardiopulmonary disease. 2. Hiatal hernia. 3. Aortic Atherosclerosis (ICD10-I70.0). Electronically Signed   By: Abigail Miyamoto M.D.   On: 11/08/2018 00:47    Labs:  CBC: Recent Labs    11/05/18 0348 11/08/18 0022 11/08/18 0415 11/09/18 0417  WBC 7.4 13.0* 14.8* 9.7  HGB 8.7* 14.1 12.0 9.1*  HCT 27.5* 45.8 39.0 29.3*  PLT 275 608* 409* 360    COAGS: Recent Labs    10/30/18 0906  INR 1.12    BMP: Recent Labs    11/08/18 1627 11/09/18 0417 11/10/18 0441 11/11/18 0357  NA 140 138 138 138  K 4.3 4.0 3.6 3.8  CL 108 108 108 108  CO2 18* 15* 21* 21*  GLUCOSE 73 75 89 89  BUN 38* 45* 39* 31*  CALCIUM 8.0* 7.9* 8.3* 8.3*  CREATININE 4.94* 4.18* 2.58* 1.72*  GFRNONAA 8* 10* 18* 29*  GFRAA 9* 12* 21* 34*    LIVER FUNCTION TESTS: Recent Labs    11/08/18 0415 11/08/18 1627 11/09/18 0417 11/10/18 0441 11/11/18 0357  BILITOT 1.9*  --  1.7* 1.7* 1.5*  AST 59*  --  52* 37 34  ALT 89*  --  81* 65* 56*  ALKPHOS 296*  --  232* 208* 203*  PROT 7.6  --  6.6 5.8* 6.4*  ALBUMIN 2.9* 2.6* 2.5* 2.3* 2.6*    Assessment and Plan:  Patient s/p biliary drain placement 11/03/18 by Dr. Vernard Gambles for obstructive jaundice who presented to [facility] ED on 11/08/18 due to poor PO intake,  vomiting, fevers, weakness and decreased urinary output. She was found to have AKI like 2/2 to dehydration - biliary drain with 1-2 L of clear yellow output QD since d/c. Patient was admitted for further management, KUB showed biliary drain in appropriate position and capping trial was initiated. Patient has done well so far with capping trial - is afebrile, WBC improved to 9.7, t.bili 1.5, creatinine improved to 1.72.  Patient to be d/ced home today - given gravity bag and instructed that if she develops jaundice, scleral icterus, abdominal pain, nausea, vomiting, fever or chills she is to connect gravity bag to drain and  call IR clinic. She has also been given orange drain cards with instructions to flush drain once daily with 5 cc NS while capped OR to gravity bag and record output once daily - flushing was demonstrated today at bedside by RN during my visit, daughter and patient stated understanding.   IR scheduler will call patient with appointment date and time - she will see [place] in approximately 6 weeks for routine follow up. She has been encouraged to call our clinic with any questions or concerns prior to her appointment.  Electronically Signed: Joaquim Nam, PA-C 11/11/2018, 10:46 AM   I spent a total of 25 Minutes at the the patient's bedside AND on the patient's hospital floor or unit, greater than 50% of which was counseling/coordinating care for biliary drain follow up.

## 2018-11-11 NOTE — Discharge Instructions (Signed)
Biliary Drainage Catheter Home Guide  A biliary drainage catheter is a thin, flexible tube that is inserted through your skin into the bile ducts in your liver. Bile is a thick yellow or green fluid that helps digest fat in foods. The purpose of a biliary drainage catheter is to keep bile from backing up into your liver. Backup of bile can occur when there is a blockage that prevents bile from moving from the bile ducts into the small intestine as it should. The blockage can be caused by gallstones, a tumor, or scar tissue. There are three types of biliary drainage:  · External biliary drainage. With this type, bile is only drained into a collection bag outside your body (external collection bag).  · Internal-external biliary drainage. With this type, bile is drained to an external collection bag as well as into your small intestine.  · Internal biliary drainage. With this type, bile is only drained into your small intestine.  General home care includes these daily actions:    · Inspection of your drainage catheter.  · Flushing your drainage catheter with saline.  · Emptying drainage from the collection bag (if present).  · Recording the amount of drainage.  · Checking the catheter insertion site for signs of infection. Check for:  ? Redness, swelling, or pain.  ? Fluid or blood.  ? Warmth.  ? Pus or a bad smell.  How do I inspect my drainage catheter?  · Check the dressing to make sure that it is dry and clean.  · Look at the skin around the drainage catheter when changing the dressing for any problems such as redness, rash, or skin breakdown.  · Check the drainage bag to make sure that drainage fluid is flowing into the bag well. Note the color and amount compared to other days.  · Check the drainage catheter and bag for any cracks or kinks in the tubing.  How do I change my dressing?  The dressing over the drainage catheter should be changed every other day, or more often if needed to keep the dressing dry. Your  health care provider will instruct you about how often to change your dressing.  Supplies needed:  · Mild soap and warm water.  · Split gauze pads, 4 x 4 inches (10 x 10 cm) to use as a dressing sponge.  · Gauze pads, 4 x 4 inches (10 x 10 cm) or adhesive dressing cover.  · Paper tape.  How to change the dressing:  1. Wash your hands with soap and water.  2. Gently remove the old dressing. Avoid using scissors to remove the dressing because they may damage the drainage catheter.  3. Wash the skin around the insertion site with mild soap and warm water, rinse well, then pat the area dry with a clean cloth.  4. Check the skin around the drainage catheter for redness or swelling, or for yellow or green discharge that has a bad smell.  5. If the drainage catheter was stitched (sutured) to the skin, inspect the suture to make sure it is still anchored in the skin.  6. Do not apply creams, ointments, or alcohol to the site. Allow the skin to air-dry completely before you apply a new dressing.  7. Place the drainage catheter through the slit in a dressing sponge. The dressing sponge should slide under the disk that holds the drainage catheter in place.  8. Cover the drainage catheter and the dressing sponge with a 4   x 4 inch (10 x 10 cm) gauze. The drainage catheter should rest on the gauze and not on the skin.  9. Tape the dressing to the skin.  10. You may be instructed to use an adhesive dressing covering over the top of this in place of the gauze and tape.  11. Wash your hands with soap and water.  How do I flush my drainage catheter?  Biliary drainagecatheters should be flushed daily, or as often as told by your health care provider. The end of the drainage catheter is closed using an IV cap. A syringe can be directly connected to the IV cap.  Supplies needed:  · Alcohol swab.  · 10 mL prefilled normal saline syringe.  How to flush the drainage catheter:  1. Wash your hands with soap and water.  2. If your drainage  catheter has a stopcock attached to it, turn the stopcock toward the drainage bag. This will allow the saline to flow in the direction of your body.  3. Clean the IV cap with an alcohol swab.  4. Screw the tip of a 10 mL normal saline syringe onto the IV cap.  5. Inject the saline over 5-10 seconds. If you feel resistance while injecting, stop immediately. Avoid  pulling back on the plunger. Doing that could increase your risk of infection.  6. Remove the syringe from the cap. Turn the stopcock so that fluid flows from your body into the drainage bag. You may notice more fluid flowing into the bag after you have completed the flush.  How do I attach a bag to my drainage catheter?  If you are having trouble with your internal biliary drain, you may be directed by your health care provider to use bag drainage until you can be seen to fix the problem. For this reason, you should always have a collection bag and connecting tubing at home. If you do not have these supplies, remember to ask for them at your next appointment.  1. Remove the bag and the connecting tubing from their packaging.  2. Connect the funnel end of the tubing to the bag's cone-shaped stem.  3. Remove the IV cap from the biliary drain. To do this, unscrew it and replace it with the screw-on end of the tubing.  4. Save the IV cap in a plastic storage bag that can be sealed.  How do I empty my collection bag?  Empty the collection bag whenever it becomes 2/3 full. Also empty it before you go to sleep. Most collection bags have a drainage valve at the bottom so the bag can be that allows them to be emptied easily.  1. Wash your hands with soap and water.  2. Hold the collection bag over the toilet, basin, or collection container. Use a measuring container if your health care provider told you to measure the drainage.  3. Unscrew the valve to open it, and allow the bag to drain.  4. Close the valve securely to avoid leakage.  5. Use a tissue or disposable  napkin to wipe the valve clean.  6. Wash the measuring container with soap and water.  7. Record the amount of drainage as told by your health care provider.  Contact a health care provider if:  · Your pain gets worse after it had improved, and it is not relieved with pain medicines.  · You have any questions about caring for your drainage catheter or collection bag.  · You have any of   these around your catheter insertion site or coming from it:  ? Skin breakdown.  ? Redness, swelling, or pain.  ? Fluid or blood.  ? Warmth to the touch.  ? Pus or a bad smell.  Get help right away if:  · You have a fever or chills.  · Your redness, swelling, or pain at the catheter insertion site gets worse, even though you are cleaning it well.  · You have leakage of bile around the drainage catheter.  · Your drainage catheter becomes blocked or clogged.  · Your drainage catheter comes out.  This information is not intended to replace advice given to you by your health care provider. Make sure you discuss any questions you have with your health care provider.  Document Released: 07/28/2013 Document Revised: 08/26/2016 Document Reviewed: 08/26/2016  Elsevier Interactive Patient Education © 2019 [employer]

## 2018-11-11 NOTE — Progress Notes (Signed)
   Subjective: Kimberly Vazquez was seen and evaluated at bedside. No acute events overnight. She was able to have a BM yesterday. Her appetite has returned to normal. She denies any abdominal pain, n/v, lightheadedness with standing. Discussed that her labs are looking much better and she will be stable for discharge today. She is scheduled for follow-up with Dr. Ardis Hughs for repeat labs.   Objective:  Vital signs in last 24 hours: Vitals:   11/10/18 1341 11/10/18 2030 11/10/18 2142 11/11/18 0516  BP: (!) 142/80  (!) 149/74 (!) 144/80  Pulse: 90  83 80  Resp: 20  16 20   Temp: 97.6 F (36.4 C)  98 F (36.7 C) 98.2 F (36.8 C)  TempSrc: Oral  Oral Oral  SpO2: 100% 98% 99% 97%  Weight:      Height:       General: awake, alert, lying comfortably in bed CV: RRR; no murmurs, rubs or gallops Abd: BS+; abdomen is soft, non-distended, non-tender   Assessment/Plan:  Principal Problem:   Acute renal failure (ARF) (HCC) Active Problems:   Dehydration   Obstructive jaundice   Biliary obstruction   Tubulovillous adenoma of small intestine   Essential hypertension   1.Acute renal failure 2/2 dehydration and likely ATN from hypotension:  - creatinine continues to improve off of IVF; now down to 1.7 - electrolytes stable and having normal urine output - she should return to normal in the next few days - she is scheduled for follow-up with GI in the next few days for repeat labs   2.Obstructive jaundice 2/2 tubulovillous adenoma with HGD s/p PCT biliary drain on 1/14: - bilirubin improved to 1.5 indicating internal biliary drain is functioning well - she will go home with external drain capped; she has been given gravity bag to re-attach if she were to develop recurrent symptoms  - appreciate IR following - she is stable for discharge home with scheduled follow-up with GI and general surgery   3. HTN: blood pressure stable; will continue to hold Triamterene-HCTZ and Valsartan at  discharge. These can be resumed outpatient when renal function returns to baseline.   4. SLE: Continue Plaquenil 200 mg BID M-F  Dispo: Patient is medically stable for discharge home today.   Modena Nunnery D, DO 11/11/2018, 8:19 AM Pager: [phone]

## 2018-11-13 LAB — CULTURE, BLOOD (ROUTINE X 2)
Culture: NO GROWTH
Culture: NO GROWTH
Special Requests: ADEQUATE
Special Requests: ADEQUATE

## 2018-11-16 ENCOUNTER — Other Ambulatory Visit: Payer: Self-pay | Admitting: General Surgery

## 2018-11-16 DIAGNOSIS — D1330 Benign neoplasm of unspecified part of small intestine: Secondary | ICD-10-CM

## 2018-11-16 DIAGNOSIS — K831 Obstruction of bile duct: Secondary | ICD-10-CM

## 2018-11-16 NOTE — Discharge Summary (Signed)
Name: Kimberly Vazquez MRN: [id number] DOB: 12-27-1945 73 y.o. PCP: Willey Blade, MD  Date of Admission: 11/08/2018 12:07 AM Date of Discharge: 11/11/2018 Attending Physician: Lucious Groves, DO  Discharge Diagnosis: 1. Acute renal failure secondary to severe dehydration 2. Obstructive jaundice 2/2 tubulovillous adenoma with HGD s/p PCT biliary drain on 1/14 3. HTN   Discharge Medications: Allergies as of 11/11/2018   No Known Allergies     Medication List    STOP taking these medications   naproxen sodium 220 MG tablet Commonly known as:  ALEVE     TAKE these medications   budesonide-formoterol 160-4.5 MCG/ACT inhaler Commonly known as:  SYMBICORT Inhale 2 puffs into the lungs 2 (two) times daily as needed (cough/congestion).   cholecalciferol 1000 units tablet Commonly known as:  VITAMIN D Take 1,000 Units by mouth daily.   Fish Oil 1000 MG Cpdr Take 1,000 mg by mouth daily.   hydroxychloroquine 200 MG tablet Commonly known as:  PLAQUENIL TAKE ONE TABLET BY MOUTH TWICE A DAY [place]. What changed:    how much to take  how to take this  when to take this  additional instructions   multivitamin with minerals Tabs tablet Take 1 tablet by mouth daily.   nitrofurantoin 50 MG capsule Commonly known as:  MACRODANTIN Take 50 mg by mouth daily.   Normal Saline Flush 0.9 % Soln Inject 5 mLs into the vein daily for 30 days.   nortriptyline 25 MG capsule Commonly known as:  PAMELOR TAKE 1 CAPSULE (25 MG TOTAL) BY MOUTH AT BEDTIME. What changed:  when to take this   ondansetron 4 MG disintegrating tablet Commonly known as:  ZOFRAN ODT Take 1 tablet (4 mg total) by mouth every 8 (eight) hours as needed for nausea or vomiting.   OVER THE COUNTER MEDICATION Take 1 capsule by mouth daily. FlameRX - proprietary blend of turmeric, resveratrol, B vitamins   SYSTANE OP Place 1 drop into both eyes daily as needed (dry eyes).   tolnaftate 1 %  cream Commonly known as:  TINACTIN Apply 1 application topically 2 (two) times daily as needed (foot fungus).       Disposition and follow-up:   [name] was discharged from [facility] in Good condition.  At the hospital follow up visit please address:  1.  Acute renal failure: Creatine improved from 6>1.7 at discharge. Please recheck at follow-up and ensure she is having normal urine output with self-catheterization.  Obstructive jaundice 2/2 tubulovillous adenoma with HGD s/p PCT biliary drain on 1/14: Patient had external drain capped and was given instructions for daily flushing. Please evaluate drain and ensure she has had follow-up with GI and general surgery regarding long-term plan for tubulovillous adenoma.  HTN:   2.  Labs / imaging needed at time of follow-up: CMP  3.  Pending labs/ test needing follow-up: none   Follow-up Appointments: Follow-up Information    [place] Follow up.   Why:  IR scheduler will call you with appointment date and time -- appointment will be approximately 6 weeks from now. Please call with any questions or concerns.  Contact information: [place] [id number] [phone]           Hospital Course by problem list: 1. Acute renal failure 2/2 severe dehydration: [name] is a 73 y/o female who recently presented on 1/9 with obstructive jaundice which was found to be secondary to tubulovillous adenoma  in her duodenum. She had biliary decompression via placement of percutaneous biliary drain and was discharged on 1/17. She presented 48 hours later with nausea, vomiting and significant drain output since discharge. Found to have creatinine of 6.0 compared to 1.1 at discharge. She was started on aggressive IVF. IR was consulted and externally capped her biliary drain after ensuring internal biliary drain was functioning appropriately. Creatinine at discharge was 1.7.   2.  Obstructive jaundice 2/2 tubulovillous adenoma with HGD s/p PCT biliary drain on 1/14: Patient presented with voluminous drain output as mentioned above. IR capped external drain after ensuring internal drain was functioning well. Bili improved from 1.9>1.5 at discharge. She was given instructions for daily drain flushes and discharged home with planned follow-up after she meets with general surgery to discuss options for management of tubulovillous adenoma.   3. HTN: her home Triamtere-HCTZ and Valsartan were held at discharge due to ARF. These can be resumed as outpatient when renal function has returned to baseline.    Discharge Vitals:   BP (!) 144/80 (BP Location: Left Arm)   Pulse 80   Temp 98.2 F (36.8 C) (Oral)   Resp 20   Ht 5' 1.5" (1.562 m)   Wt 96.3 kg   SpO2 97%   BMI 39.46 kg/m   Pertinent Labs, Studies, and Procedures:  CMP Latest Ref Rng & Units 11/11/2018 11/10/2018 11/09/2018  Glucose 70 - 99 mg/dL 89 89 75  BUN 8 - 23 mg/dL 31(H) 39(H) 45(H)  Creatinine 0.44 - 1.00 mg/dL 1.72(H) 2.58(H) 4.18(H)  Sodium 135 - 145 mmol/L 138 138 138  Potassium 3.5 - 5.1 mmol/L 3.8 3.6 4.0  Chloride 98 - 111 mmol/L 108 108 108  CO2 22 - 32 mmol/L 21(L) 21(L) 15(L)  Calcium 8.9 - 10.3 mg/dL 8.3(L) 8.3(L) 7.9(L)  Total Protein 6.5 - 8.1 g/dL 6.4(L) 5.8(L) 6.6  Total Bilirubin 0.3 - 1.2 mg/dL 1.5(H) 1.7(H) 1.7(H)  Alkaline Phos 38 - 126 U/L 203(H) 208(H) 232(H)  AST 15 - 41 U/L 34 37 52(H)  ALT 0 - 44 U/L 56(H) 65(H) 81(H)     Discharge Instructions: Discharge Instructions    Call MD for:  persistant nausea and vomiting   Complete by:  As directed    Call MD for:  redness, tenderness, or signs of infection (pain, swelling, redness, odor or green/yellow discharge around incision site)   Complete by:  As directed    Call MD for:  severe uncontrolled pain   Complete by:  As directed    Call MD for:  temperature >100.4   Complete by:  As directed    Diet - low sodium heart healthy    Complete by:  As directed    Discharge instructions   Complete by:  As directed    [name], it was a pleasure taking care of you again. I am so glad you are feeling better. Your kidney function has greatly improved since receiving fluids and having your drain capped. You will be sent home with a bag to put on the drain if you start developing symptoms. Otherwise, it is set up to drain through you intestines. You can continue drinking warm prune juice as well as using stool softeners to help regulate your bowel movements.  Make sure you see Dr. Ardis Hughs for repeat labs in the next few days. We will continue holding your blood pressure medications for now until your kidney function returns to normal.  I wish you all the best with  your appointment with Dr. Barry Dienes.  Take care! Dr. Koleen Distance   Increase activity slowly   Complete by:  As directed       Signed: Delice Bison, DO 11/19/2018, 4:06 PM   Pager: [phone]

## 2018-11-23 ENCOUNTER — Other Ambulatory Visit: Payer: Self-pay | Admitting: Internal Medicine

## 2018-11-23 DIAGNOSIS — Z1231 Encounter for screening mammogram for malignant neoplasm of breast: Secondary | ICD-10-CM

## 2018-12-01 ENCOUNTER — Telehealth: Payer: Self-pay | Admitting: Gastroenterology

## 2018-12-01 NOTE — Telephone Encounter (Signed)
Disregard

## 2018-12-01 NOTE — Telephone Encounter (Signed)
I received a referral for the PT to get a ERCP she is a patient of Dr. Fuller Plan. Would you like for me to schedule for a OV or will you schedule the ERCP.

## 2018-12-21 ENCOUNTER — Ambulatory Visit
Admission: RE | Admit: 2018-12-21 | Discharge: 2018-12-21 | Disposition: A | Discharge status: Home / Self Care | Payer: Medicare Other | Source: Ambulatory Visit | Attending: Internal Medicine | Admitting: Internal Medicine

## 2018-12-21 DIAGNOSIS — Z1231 Encounter for screening mammogram for malignant neoplasm of breast: Secondary | ICD-10-CM

## 2018-12-22 ENCOUNTER — Other Ambulatory Visit: Payer: Self-pay | Admitting: Rheumatology

## 2018-12-22 ENCOUNTER — Ambulatory Visit
Admission: RE | Admit: 2018-12-22 | Discharge: 2018-12-22 | Disposition: A | Discharge status: Home / Self Care | Payer: Medicare Other | Source: Ambulatory Visit | Attending: Physician Assistant | Admitting: Physician Assistant

## 2018-12-22 ENCOUNTER — Ambulatory Visit
Admission: RE | Admit: 2018-12-22 | Discharge: 2018-12-22 | Disposition: A | Discharge status: Home / Self Care | Payer: Medicare Other | Source: Ambulatory Visit | Attending: General Surgery | Admitting: General Surgery

## 2018-12-22 ENCOUNTER — Encounter: Payer: Self-pay | Admitting: Gastroenterology

## 2018-12-22 ENCOUNTER — Other Ambulatory Visit (INDEPENDENT_AMBULATORY_CARE_PROVIDER_SITE_OTHER): Payer: Medicare Other

## 2018-12-22 ENCOUNTER — Ambulatory Visit: Payer: Medicare Other | Admitting: Gastroenterology

## 2018-12-22 VITALS — BP 120/80 | HR 100 | Ht 61.5 in | Wt 182.0 lb

## 2018-12-22 DIAGNOSIS — K831 Obstruction of bile duct: Secondary | ICD-10-CM

## 2018-12-22 DIAGNOSIS — M359 Systemic involvement of connective tissue, unspecified: Secondary | ICD-10-CM

## 2018-12-22 DIAGNOSIS — D1330 Benign neoplasm of unspecified part of small intestine: Secondary | ICD-10-CM

## 2018-12-22 DIAGNOSIS — D132 Benign neoplasm of duodenum: Secondary | ICD-10-CM

## 2018-12-22 HISTORY — PX: IR RADIOLOGIST EVAL & MGMT: IMG5224

## 2018-12-22 LAB — CBC WITH DIFFERENTIAL/PLATELET
BASOS PCT: 1.1 % (ref 0.0–3.0)
Basophils Absolute: 0.1 10*3/uL (ref 0.0–0.1)
Eosinophils Absolute: 0.5 10*3/uL (ref 0.0–0.7)
Eosinophils Relative: 9.1 % — ABNORMAL HIGH (ref 0.0–5.0)
HCT: 32.8 % — ABNORMAL LOW (ref 36.0–46.0)
Hemoglobin: 10.5 g/dL — ABNORMAL LOW (ref 12.0–15.0)
Lymphocytes Relative: 22.7 % (ref 12.0–46.0)
Lymphs Abs: 1.2 10*3/uL (ref 0.7–4.0)
MCHC: 32.1 g/dL (ref 30.0–36.0)
MCV: 79.2 fl (ref 78.0–100.0)
MONOS PCT: 6.8 % (ref 3.0–12.0)
Monocytes Absolute: 0.4 10*3/uL (ref 0.1–1.0)
Neutro Abs: 3.3 10*3/uL (ref 1.4–7.7)
Neutrophils Relative %: 60.3 % (ref 43.0–77.0)
Platelets: 269 10*3/uL (ref 150.0–400.0)
RBC: 4.14 Mil/uL (ref 3.87–5.11)
RDW: 16.2 % — ABNORMAL HIGH (ref 11.5–15.5)
WBC: 5.4 10*3/uL (ref 4.0–10.5)

## 2018-12-22 LAB — COMPREHENSIVE METABOLIC PANEL
ALT: 8 U/L (ref 0–35)
AST: 12 U/L (ref 0–37)
Albumin: 3.9 g/dL (ref 3.5–5.2)
Alkaline Phosphatase: 108 U/L (ref 39–117)
BUN: 13 mg/dL (ref 6–23)
CHLORIDE: 102 meq/L (ref 96–112)
CO2: 29 mEq/L (ref 19–32)
Calcium: 9.4 mg/dL (ref 8.4–10.5)
Creatinine, Ser: 0.89 mg/dL (ref 0.40–1.20)
GFR: 75.31 mL/min (ref >60.00)
Glucose, Bld: 83 mg/dL (ref 70–99)
POTASSIUM: 3.9 meq/L (ref 3.5–5.1)
Sodium: 138 mEq/L (ref 135–145)
Total Bilirubin: 0.8 mg/dL (ref 0.2–1.2)
Total Protein: 7.2 g/dL (ref 6.0–8.3)

## 2018-12-22 NOTE — Telephone Encounter (Signed)
Last Visit: 09/01/18 Next Visit: 02/02/19  Labs:09/01/18 Creat. 0.97 Hgb 11.1 HCT 34.8 PLQ Eye Exam: 09/01/18 WNL  Okay to refill per Dr. Estanislado Pandy

## 2018-12-22 NOTE — Patient Instructions (Addendum)
Your provider has requested that you go to the basement level for lab work before leaving today. Press "B" on the elevator. The lab is located at the first door on the left as you exit the elevator.  Keep appointment with Dr. Gayleen Orem at [facility].  Also keep appointment with [facility] for management of drain.   No follow up with [place] is planned.

## 2018-12-22 NOTE — Progress Notes (Signed)
Chief Complaint: Billiary obstruction  Referring Physician(s): Barry Dienes, F.  Supervising Physician: Sandi Mariscal  History of Present Illness: Kimberly Vazquez is a 73 y.o. 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    She was admitted on 10/29/2018 with 5 days of abdominal pain, nausea and vomiting.  CT scan done showed= Gallbladder dilatation is now noted with severe dilatation of common bile duct without definite evidence of obstructing calculus. Some degree of pancreatic ductal dilatation is noted as well. Possible pancreatic mass can not be excluded. Further evaluation with MRCP is recommended to evaluate for possible cause of obstruction.  ERCP by Dr. Carlean Purl was not successful.    She had mild post ERCP pancreatitis.    She had placement of a biliary drain on 11/03/2018 by Dr. Vernard Gambles.    Since discharge from the hospital she has had a second opinion with [facility] and had an office evaluation with Dr. Barry Dienes.  Dr. Barry Dienes recommended a Whipple (or similar) procedure.   She has scheduled an appointment with Dr. Gayleen Orem at [facility] for an opinion.   She is here today for follow up CT scan.  Her drain is currently capped. She denies any abdominal pain, no N/V,  No fever/chills.  Past Medical History:  Diagnosis Date  . Allergic rhinitis   . Anemia   . Arthritis   . Bladder disorder    "lazy" , caths twice a day  . Depression   . Fatty liver   . GERD (gastroesophageal reflux disease)   . Hypertension   . Seasonal allergies   . Systemic lupus erythematosus ([place])   . UTI (urinary tract infection)   . Vitamin D deficiency     Past Surgical History:  Procedure Laterality Date  . ABDOMINAL HYSTERECTOMY    . BIOPSY  10/31/2018   Procedure: BIOPSY;  Surgeon: Gatha Mayer, MD;  Location: [place] [facility];  Service: Endoscopy;;  . ENDOSCOPIC RETROGRADE CHOLANGIOPANCREATOGRAPHY (ERCP) WITH PROPOFOL  N/A 10/31/2018   Procedure: ENDOSCOPIC RETROGRADE CHOLANGIOPANCREATOGRAPHY (ERCP) WITH PROPOFOL;  Surgeon: Gatha Mayer, MD;  Location: [facility] ENDOSCOPY;  Service: Endoscopy;  Laterality: N/A;  . IR INT EXT BILIARY DRAIN WITH CHOLANGIOGRAM  11/03/2018  . ROTATOR CUFF REPAIR Right 2012  . TONSILLECTOMY  1965    Allergies: Patient has no known allergies.  Medications: Prior to Admission medications   Medication Sig Start Date End Date Taking? Authorizing Provider  cholecalciferol (VITAMIN D) 1000 UNITS tablet Take 1,000 Units by mouth daily.     [provider]  hydroxychloroquine (PLAQUENIL) 200 MG tablet TAKE ONE TABLET BY MOUTH TWICE A DAY MONDAY THROUGH FRIDAY. 12/22/18   Bo Merino, MD  Multiple Vitamin (MULTIVITAMIN WITH MINERALS) TABS tablet Take 1 tablet by mouth daily.    [provider]  nitrofurantoin (MACRODANTIN) 50 MG capsule Take 50 mg by mouth daily.     [provider]  nortriptyline (PAMELOR) 25 MG capsule TAKE 1 CAPSULE (25 MG TOTAL) BY MOUTH AT BEDTIME. Patient taking differently: Take 25 mg by mouth daily.  10/31/17   Jessy Oto, MD  Omega-3 Fatty Acids (FISH OIL) 1000 MG CPDR Take 1,000 mg by mouth daily.     [provider]  OVER THE COUNTER MEDICATION Take 1 capsule by mouth daily. FlameRX - proprietary blend of turmeric, resveratrol, B vitamins    [provider]  valsartan (DIOVAN) 160 MG tablet Take 160 mg by mouth daily. 11/18/18  [provider]     Family History  Problem Relation Age of Onset  . Cancer Brother        unknown  . Kidney disease Son   . Diabetes Brother   . Heart disease Brother   . Colon cancer Neg Hx     Social History   Socioeconomic History  . Marital status: Married    Spouse name: Not on file  . Number of children: 2  . Years of education: Not on file  . Highest education level: Not on file  Occupational History  . Occupation: Retired [employer] Professor     Employer: RETIRED  Social Needs  . Financial resource strain: Not very hard  . Food insecurity:    Worry: Never true    Inability: Never true  . Transportation needs:    Medical: No    Non-medical: No  Tobacco Use  . Smoking status: Former Smoker    Years: 22.00    Types: Cigarettes    Last attempt to quit: 03/21/2002    Years since quitting: 16.7  . Smokeless tobacco: Never Used  Substance and Sexual Activity  . Alcohol use: Yes    Comment: rare  . Drug use: No  . Sexual activity: Not on file  Lifestyle  . Physical activity:    Days per week: 5 days    Minutes per session: Not on file  . Stress: Only a little  Relationships  . Social connections:    Talks on phone: More than three times a week    Gets together: Three times a week    Attends religious service: More than 4 times per year    Active member of club or organization: Yes    Attends meetings of clubs or organizations: More than 4 times per year    Relationship status: Married  Other Topics Concern  . Not on file  Social History Narrative  . Not on file    Review of Systems  Vital Signs: BP 131/78   Pulse 96   Temp 98 F (36.7 C) (Oral)   Resp 14   SpO2 100%   Physical Exam Vitals signs reviewed.  Constitutional:      Appearance: Normal appearance.  Pulmonary:     Effort: Pulmonary effort is normal.  Abdominal:     Palpations: Abdomen is soft.     Tenderness: There is no abdominal tenderness.     Comments: Drain in place. Currently capped.  Neurological:     General: No focal deficit present.     Mental Status: She is alert and oriented to person, place, and time.  Psychiatric:        Mood and Affect: Mood normal.        Behavior: Behavior normal.        Thought Content: Thought content normal.        Judgment: Judgment normal.        Imaging: Mm 3d Screen Breast Bilateral  Result Date: 12/22/2018 CLINICAL DATA:  Screening. EXAM: DIGITAL SCREENING BILATERAL MAMMOGRAM WITH TOMO AND CAD  COMPARISON:  Previous exam(s). ACR Breast Density Category b: There are scattered areas of fibroglandular density. FINDINGS: There are no findings suspicious for malignancy. Images were processed with CAD. IMPRESSION: No mammographic evidence of malignancy. A result letter of this screening mammogram will be mailed directly to the patient. RECOMMENDATION: Screening mammogram in one year. (Code:SM-B-01Y) BI-RADS CATEGORY  1: Negative. Electronically Signed   By: Ammie Ferrier M.D.  On: 12/22/2018 11:19    Labs:  CBC: Recent Labs    11/05/18 0348 11/08/18 0022 11/08/18 0415 11/09/18 0417  WBC 7.4 13.0* 14.8* 9.7  HGB 8.7* 14.1 12.0 9.1*  HCT 27.5* 45.8 39.0 29.3*  PLT 275 608* 409* 360    COAGS: Recent Labs    10/30/18 0906  INR 1.12    BMP: Recent Labs    11/08/18 1627 11/09/18 0417 11/10/18 0441 11/11/18 0357  NA 140 138 138 138  K 4.3 4.0 3.6 3.8  CL 108 108 108 108  CO2 18* 15* 21* 21*  GLUCOSE 73 75 89 89  BUN 38* 45* 39* 31*  CALCIUM 8.0* 7.9* 8.3* 8.3*  CREATININE 4.94* 4.18* 2.58* 1.72*  GFRNONAA 8* 10* 18* 29*  GFRAA 9* 12* 21* 34*    LIVER FUNCTION TESTS: Recent Labs    11/08/18 0415 11/08/18 1627 11/09/18 0417 11/10/18 0441 11/11/18 0357  BILITOT 1.9*  --  1.7* 1.7* 1.5*  AST 59*  --  52* 37 34  ALT 89*  --  81* 65* 56*  ALKPHOS 296*  --  232* 208* 203*  PROT 7.6  --  6.6 5.8* 6.4*  ALBUMIN 2.9* 2.6* 2.5* 2.3* 2.6*    TUMOR MARKERS: No results for input(s): AFPTM, CEA, CA199, CHROMGRNA in the last 8760 hours.  Assessment:  Large laterally spreading duodenal tubulovillous adenoma with HGD that led to biliary obstruction.    Plan:  Dr. Barry Dienes recommended a Whipple (or similar) procedure.   She has scheduled an appointment with Dr. Gayleen Orem at [facility] for an opinion.   Drain to remain in place until after this visit. We will follow up with her via phone and inquire about the plan.  If she is to proceed with surgery in a timely  manner, we will leave the drain in place.  If she has NOT had surgery by mid to late March, we will need to exchange the drain.   Electronically Signed: Murrell Redden PA-C 12/22/2018, 1:37 PM    Please refer to Dr. Deniece Portela attestation of this note for management and plan.

## 2018-12-22 NOTE — Progress Notes (Signed)
    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  ERCP by Dr. Carlean Purl was not successful.  She had mild post ERCP pancreatitis.  Interventional radiology placed a biliary drain.  Since discharge from the hospital she has had a second opinion with [facility] and had an office evaluation with Dr. Barry Dienes - unfortunately I do not have those records available to me today. Patient related that Dr. Barry Dienes recommended a Whipple (or similar) procedure. She has scheduled an appointment with Dr. Gayleen Orem at [facility] for an opinion.   Current Medications, Allergies, Past Medical History, Past Surgical History, Family History and Social History were reviewed in [employer] record.  Physical Exam: General: Well developed, well nourished, no acute distress Head: Normocephalic and atraumatic Eyes:  sclerae anicteric, EOMI Ears: Normal auditory acuity Mouth: No deformity or lesions Lungs: Clear throughout to auscultation Heart: Regular rate and rhythm; no murmurs, rubs or bruits Abdomen: Soft, non tender and non distended. Catheter site is clean, dry. No masses, hepatosplenomegaly or hernias noted. Normal Bowel sounds Rectal: not done Musculoskeletal: Symmetrical with no gross deformities  Pulses:  Normal pulses noted Extremities: No clubbing, cyanosis, edema or deformities noted Neurological: Alert oriented x 4, grossly nonfocal Psychological:  Alert and cooperative. Normal mood and affect   Assessment and Recommendations:  1.  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 IR internal/external biliary drain placed on January 14.  IR appointment today for ongoing management of biliary drain.  The duodenal polyp does not appear amenable to endoscopic removal.  Surgical management has been recommended. I reinforced the  recommendation for surgical management.  Patient has scheduled another GI opinion at [facility] with Dr. Gayleen Orem.  Request records from [place] and Dr. Barry Dienes.  I suggested she consider a second surgical opinion at [facility]. CMP, CBC today. Follow-up is planned with IR, Dr. Lysle Rubens, Dr. Barry Dienes and her PCP.

## 2018-12-22 NOTE — Progress Notes (Signed)
Please let the patient know that the obstruction looks improved on the CT scan.

## 2018-12-30 ENCOUNTER — Other Ambulatory Visit (INDEPENDENT_AMBULATORY_CARE_PROVIDER_SITE_OTHER): Payer: Self-pay | Admitting: Specialist

## 2018-12-30 ENCOUNTER — Telehealth: Payer: Self-pay

## 2018-12-30 NOTE — Telephone Encounter (Signed)
Please advise patient has not been seen since 11/2014

## 2018-12-30 NOTE — Telephone Encounter (Signed)
Received fax from [facility] stating that they have searched their files and find no record of this person being treated at that facility.

## 2019-01-23 MED ORDER — HEPARIN SODIUM (PORCINE) 5000 UNIT/ML IJ SOLN
5000.00 | INTRAMUSCULAR | Status: DC
Start: 2019-01-23 — End: 2019-01-23

## 2019-01-23 MED ORDER — ACETAMINOPHEN 500 MG PO TABS
1000.00 | ORAL_TABLET | ORAL | Status: DC
Start: 2019-01-23 — End: 2019-01-23

## 2019-01-23 MED ORDER — SODIUM CHLORIDE FLUSH 0.9 % IV SOLN
10.00 | INTRAVENOUS | Status: DC
Start: 2019-01-23 — End: 2019-01-23

## 2019-01-23 MED ORDER — METOCLOPRAMIDE HCL 5 MG/5ML PO SOLN
10.00 | ORAL | Status: DC
Start: 2019-01-23 — End: 2019-01-23

## 2019-01-23 MED ORDER — LABETALOL HCL 5 MG/ML IV SOLN
10.00 | INTRAVENOUS | Status: DC
Start: ? — End: 2019-01-23

## 2019-01-23 MED ORDER — HYDRALAZINE HCL 20 MG/ML IJ SOLN
10.00 | INTRAMUSCULAR | Status: DC
Start: ? — End: 2019-01-23

## 2019-01-23 MED ORDER — PANTOPRAZOLE SODIUM 40 MG PO TBEC
40.00 | DELAYED_RELEASE_TABLET | ORAL | Status: DC
Start: 2019-01-24 — End: 2019-01-23

## 2019-01-23 MED ORDER — CRITIKON CUFF NEO SOFT #5 MISC
1.00 | Status: DC
Start: ? — End: 2019-01-23

## 2019-01-23 MED ORDER — VALSARTAN 160 MG PO TABS
160.00 | ORAL_TABLET | ORAL | Status: DC
Start: 2019-01-24 — End: 2019-01-23

## 2019-01-23 MED ORDER — PHENOL 1.4 % MT LIQD
2.00 | OROMUCOSAL | Status: DC
Start: ? — End: 2019-01-23

## 2019-01-23 MED ORDER — ONDANSETRON HCL 4 MG/2ML IJ SOLN
4.00 | INTRAMUSCULAR | Status: DC
Start: ? — End: 2019-01-23

## 2019-01-23 MED ORDER — GENERIC EXTERNAL MEDICATION
Status: DC
Start: ? — End: 2019-01-23

## 2019-01-23 MED ORDER — SUCRALFATE 1 GM/10ML PO SUSP
1.00 | ORAL | Status: DC
Start: 2019-01-23 — End: 2019-01-23

## 2019-01-23 MED ORDER — MELATONIN 3 MG PO TABS
3.00 | ORAL_TABLET | ORAL | Status: DC
Start: ? — End: 2019-01-23

## 2019-01-23 MED ORDER — LORATADINE 10 MG PO TABS
10.00 | ORAL_TABLET | ORAL | Status: DC
Start: 2019-01-24 — End: 2019-01-23

## 2019-01-23 MED ORDER — NORTRIPTYLINE HCL 25 MG PO CAPS
25.00 | ORAL_CAPSULE | ORAL | Status: DC
Start: 2019-01-23 — End: 2019-01-23

## 2019-01-23 MED ORDER — HYDROXYCHLOROQUINE SULFATE 200 MG PO TABS
200.00 | ORAL_TABLET | ORAL | Status: DC
Start: 2019-01-23 — End: 2019-01-23

## 2019-01-23 MED ORDER — TRIAMTERENE-HCTZ 37.5-25 MG PO TABS
1.00 | ORAL_TABLET | ORAL | Status: DC
Start: 2019-01-24 — End: 2019-01-23

## 2019-02-02 ENCOUNTER — Ambulatory Visit: Payer: Medicare Other | Admitting: Physician Assistant

## 2019-02-19 MED ORDER — SELECT BRAND INSULIN SYRINGE 29G X 1/2" 1 ML MISC
1.00 | Status: DC
Start: 2019-02-20 — End: 2019-02-19

## 2019-02-19 MED ORDER — TRIAMTERENE-HCTZ 37.5-25 MG PO TABS
1.00 | ORAL_TABLET | ORAL | Status: DC
Start: 2019-02-20 — End: 2019-02-19

## 2019-02-19 MED ORDER — VALSARTAN 160 MG PO TABS
160.00 | ORAL_TABLET | ORAL | Status: DC
Start: 2019-02-20 — End: 2019-02-19

## 2019-02-19 MED ORDER — OXYCODONE HCL 5 MG/5ML PO SOLN
5.00 | ORAL | Status: DC
Start: ? — End: 2019-02-19

## 2019-02-19 MED ORDER — ONDANSETRON HCL 4 MG/2ML IJ SOLN
4.00 | INTRAMUSCULAR | Status: DC
Start: ? — End: 2019-02-19

## 2019-02-19 MED ORDER — PANTOPRAZOLE SODIUM 40 MG PO TBEC
40.00 | DELAYED_RELEASE_TABLET | ORAL | Status: DC
Start: 2019-02-20 — End: 2019-02-19

## 2019-02-19 MED ORDER — HYDROXYCHLOROQUINE SULFATE 200 MG PO TABS
200.00 | ORAL_TABLET | ORAL | Status: DC
Start: 2019-02-19 — End: 2019-02-19

## 2019-02-19 MED ORDER — NORTRIPTYLINE HCL 25 MG PO CAPS
25.00 | ORAL_CAPSULE | ORAL | Status: DC
Start: 2019-02-19 — End: 2019-02-19

## 2019-02-19 MED ORDER — GENERIC EXTERNAL MEDICATION
12.50 | Status: DC
Start: ? — End: 2019-02-19

## 2019-02-19 MED ORDER — CEFDINIR 250 MG/5ML PO SUSR
300.00 | ORAL | Status: DC
Start: 2019-02-19 — End: 2019-02-19

## 2019-02-19 MED ORDER — MELATONIN 3 MG PO TABS
3.00 | ORAL_TABLET | ORAL | Status: DC
Start: 2019-02-19 — End: 2019-02-19

## 2019-02-19 MED ORDER — HEPARIN SODIUM (PORCINE) 5000 UNIT/ML IJ SOLN
5000.00 | INTRAMUSCULAR | Status: DC
Start: 2019-02-19 — End: 2019-02-19

## 2019-02-19 MED ORDER — LORATADINE 10 MG PO TABS
10.00 | ORAL_TABLET | ORAL | Status: DC
Start: 2019-02-20 — End: 2019-02-19

## 2019-02-19 MED ORDER — EQ LUBRICANT EYE DROPS OP
1000.00 | OPHTHALMIC | Status: DC
Start: 2019-02-19 — End: 2019-02-19

## 2019-02-19 MED ORDER — LORAZEPAM 0.5 MG PO TABS
0.50 | ORAL_TABLET | ORAL | Status: DC
Start: 2019-02-19 — End: 2019-02-19

## 2019-02-19 MED ORDER — ERYTHROMYCIN BASE 250 MG PO TABS
250.00 | ORAL_TABLET | ORAL | Status: DC
Start: 2019-02-19 — End: 2019-02-19

## 2019-02-19 MED ORDER — Medication
1.00 | Status: DC
Start: 2019-02-20 — End: 2019-02-19

## 2019-02-19 MED ORDER — GENERIC EXTERNAL MEDICATION
80.00 | Status: DC
Start: ? — End: 2019-02-19

## 2019-02-23 ENCOUNTER — Telehealth: Payer: Self-pay | Admitting: Rheumatology

## 2019-02-23 ENCOUNTER — Other Ambulatory Visit: Payer: Self-pay | Admitting: Rheumatology

## 2019-02-23 DIAGNOSIS — M359 Systemic involvement of connective tissue, unspecified: Secondary | ICD-10-CM

## 2019-02-23 NOTE — Telephone Encounter (Signed)
FYI: I called patient to schedule a follow up appt with Dr. Estanislado Pandy. I spoke with patient's daughter. 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 Patient is going to [facility] for treatments. They will call back at a later date to schedule patient a follow up.

## 2019-02-23 NOTE — Telephone Encounter (Signed)
Last Visit: 09/01/2018 Next Visit: message sent to the front desk to schedule.  Labs: 12/22/2018  Eye exam:  09/01/2018  Okay to refill per Dr. Estanislado Pandy.

## 2019-02-23 NOTE — Telephone Encounter (Signed)
-----   Message from [place] sent at 02/23/2019 11:22 AM EDT ----- Please call to schedule follow up appointment. Patient is due now. Thanks!

## 2019-03-02 MED ORDER — ERYTHROMYCIN BASE 250 MG PO TABS
250.00 | ORAL_TABLET | ORAL | Status: DC
Start: 2019-03-02 — End: 2019-03-02

## 2019-03-02 MED ORDER — SODIUM CHLORIDE FLUSH 0.9 % IV SOLN
10.00 | INTRAVENOUS | Status: DC
Start: 2019-03-01 — End: 2019-03-02

## 2019-03-02 MED ORDER — ONDANSETRON HCL 4 MG/2ML IJ SOLN
4.00 | INTRAMUSCULAR | Status: DC
Start: ? — End: 2019-03-02

## 2019-03-02 MED ORDER — LORAZEPAM 0.5 MG PO TABS
0.50 | ORAL_TABLET | ORAL | Status: DC
Start: 2019-03-02 — End: 2019-03-02

## 2019-03-02 MED ORDER — SODIUM CHLORIDE 0.9 % IV SOLN
1000.00 | INTRAVENOUS | Status: DC
Start: 2019-03-02 — End: 2019-03-02

## 2019-03-02 MED ORDER — LIDOCAINE 5 % EX PTCH
1.00 | MEDICATED_PATCH | CUTANEOUS | Status: DC
Start: ? — End: 2019-03-02

## 2019-03-02 MED ORDER — NORTRIPTYLINE HCL 10 MG/5ML PO SOLN
25.00 | ORAL | Status: DC
Start: 2019-03-01 — End: 2019-03-02

## 2019-03-02 MED ORDER — SODIUM CHLORIDE 0.9 % IV SOLN
INTRAVENOUS | Status: DC
Start: ? — End: 2019-03-02

## 2019-03-02 MED ORDER — MELATONIN 3 MG PO TABS
3.00 | ORAL_TABLET | ORAL | Status: DC
Start: 2019-03-01 — End: 2019-03-02

## 2019-03-02 MED ORDER — ACETAMINOPHEN 650 MG/20.3ML PO SOLN
1000.00 | ORAL | Status: DC
Start: ? — End: 2019-03-02

## 2019-03-02 MED ORDER — OXYCODONE HCL 5 MG/5ML PO SOLN
5.00 | ORAL | Status: DC
Start: ? — End: 2019-03-02

## 2019-03-02 MED ORDER — HEPARIN SODIUM (PORCINE) 5000 UNIT/ML IJ SOLN
5000.00 | INTRAMUSCULAR | Status: DC
Start: 2019-03-01 — End: 2019-03-02

## 2019-03-02 MED ORDER — GENERIC EXTERNAL MEDICATION
40.00 | Status: DC
Start: 2019-03-02 — End: 2019-03-02

## 2019-03-02 MED ORDER — GENERIC EXTERNAL MEDICATION
200.00 | Status: DC
Start: 2019-03-01 — End: 2019-03-02

## 2019-04-30 ENCOUNTER — Other Ambulatory Visit: Payer: Self-pay | Admitting: Rheumatology

## 2019-04-30 DIAGNOSIS — M359 Systemic involvement of connective tissue, unspecified: Secondary | ICD-10-CM

## 2019-04-30 NOTE — Telephone Encounter (Signed)
Last Visit: 09/01/2018 Next Visit: message sent to the front desk to schedule.( patient has not scheduled at this time due to going to [facility] for cancer treatments. Labs: 12/22/2018  Eye exam:  09/01/2018  Okay to refill PLQ?

## 2019-04-30 NOTE — Telephone Encounter (Signed)
Ok to refill 

## 2019-05-21 MED ORDER — PANTOPRAZOLE SODIUM 40 MG PO TBEC
40.00 | DELAYED_RELEASE_TABLET | ORAL | Status: DC
Start: 2019-05-22 — End: 2019-05-21

## 2019-05-21 MED ORDER — GENERIC EXTERNAL MEDICATION
1.00 | Status: DC
Start: 2019-05-21 — End: 2019-05-21

## 2019-05-21 MED ORDER — NORTRIPTYLINE HCL 50 MG PO CAPS
50.00 | ORAL_CAPSULE | ORAL | Status: DC
Start: 2019-05-21 — End: 2019-05-21

## 2019-05-21 MED ORDER — HYDRALAZINE HCL 50 MG PO TABS
25.00 | ORAL_TABLET | ORAL | Status: DC
Start: 2019-05-21 — End: 2019-05-21

## 2019-05-21 MED ORDER — HYDROXYCHLOROQUINE SULFATE 200 MG PO TABS
200.00 | ORAL_TABLET | ORAL | Status: DC
Start: 2019-05-21 — End: 2019-05-21

## 2019-05-21 MED ORDER — ONDANSETRON HCL 4 MG/2ML IJ SOLN
4.00 | INTRAMUSCULAR | Status: DC
Start: ? — End: 2019-05-21

## 2019-05-21 MED ORDER — TRAMADOL HCL 50 MG PO TABS
50.00 | ORAL_TABLET | ORAL | Status: DC
Start: ? — End: 2019-05-21

## 2019-05-21 MED ORDER — MELATONIN 3 MG PO TABS
3.00 | ORAL_TABLET | ORAL | Status: DC
Start: ? — End: 2019-05-21

## 2019-05-21 MED ORDER — ENOXAPARIN SODIUM 40 MG/0.4ML ~~LOC~~ SOLN
40.00 | SUBCUTANEOUS | Status: DC
Start: 2019-05-21 — End: 2019-05-21

## 2019-05-21 MED ORDER — MIRTAZAPINE 15 MG PO TABS
30.00 | ORAL_TABLET | ORAL | Status: DC
Start: 2019-05-21 — End: 2019-05-21

## 2019-05-21 MED ORDER — METOPROLOL TARTRATE 25 MG PO TABS
25.00 | ORAL_TABLET | ORAL | Status: DC
Start: 2019-05-21 — End: 2019-05-21

## 2019-06-30 ENCOUNTER — Telehealth: Payer: Self-pay

## 2019-06-30 NOTE — Telephone Encounter (Signed)
She called and left a message. She needs date and times of appts with Dr. Alvy Bimler.  Called back and given date and times. She verbalized understanding.

## 2019-07-14 ENCOUNTER — Other Ambulatory Visit: Payer: Self-pay

## 2019-07-14 ENCOUNTER — Telehealth: Payer: Self-pay | Admitting: Hematology and Oncology

## 2019-07-14 ENCOUNTER — Inpatient Hospital Stay: Payer: Medicare Other | Attending: Hematology and Oncology

## 2019-07-14 DIAGNOSIS — D472 Monoclonal gammopathy: Secondary | ICD-10-CM

## 2019-07-14 LAB — CMP (CANCER CENTER ONLY)
ALT: 20 U/L (ref 0–44)
AST: 19 U/L (ref 15–41)
Albumin: 3.5 g/dL (ref 3.5–5.0)
Alkaline Phosphatase: 119 U/L (ref 38–126)
Anion gap: 7 (ref 5–15)
BUN: 16 mg/dL (ref 8–23)
CO2: 26 mmol/L (ref 22–32)
Calcium: 9.1 mg/dL (ref 8.9–10.3)
Chloride: 106 mmol/L (ref 98–111)
Creatinine: 0.86 mg/dL (ref 0.44–1.00)
GFR, Est AFR Am: >60 mL/min (ref >60)
GFR, Estimated: >60 mL/min (ref >60)
Glucose, Bld: 96 mg/dL (ref 70–99)
Potassium: 4.3 mmol/L (ref 3.5–5.1)
Sodium: 139 mmol/L (ref 135–145)
Total Bilirubin: 0.5 mg/dL (ref 0.3–1.2)
Total Protein: 6.7 g/dL (ref 6.5–8.1)

## 2019-07-14 LAB — CBC WITH DIFFERENTIAL (CANCER CENTER ONLY)
Abs Immature Granulocytes: 0.05 10*3/uL (ref 0.00–0.07)
Basophils Absolute: 0 10*3/uL (ref 0.0–0.1)
Basophils Relative: 0 %
Eosinophils Absolute: 0.2 10*3/uL (ref 0.0–0.5)
Eosinophils Relative: 3 %
HCT: 34.5 % — ABNORMAL LOW (ref 36.0–46.0)
Hemoglobin: 10.7 g/dL — ABNORMAL LOW (ref 12.0–15.0)
Immature Granulocytes: 1 %
Lymphocytes Relative: 23 %
Lymphs Abs: 1.2 10*3/uL (ref 0.7–4.0)
MCH: 25.7 pg — ABNORMAL LOW (ref 26.0–34.0)
MCHC: 31 g/dL (ref 30.0–36.0)
MCV: 82.9 fL (ref 80.0–100.0)
Monocytes Absolute: 0.4 10*3/uL (ref 0.1–1.0)
Monocytes Relative: 8 %
Neutro Abs: 3.6 10*3/uL (ref 1.7–7.7)
Neutrophils Relative %: 65 %
Platelet Count: 198 10*3/uL (ref 150–400)
RBC: 4.16 MIL/uL (ref 3.87–5.11)
RDW: 14.7 % (ref 11.5–15.5)
WBC Count: 5.5 10*3/uL (ref 4.0–10.5)
nRBC: 0 % (ref 0.0–0.2)

## 2019-07-14 NOTE — Telephone Encounter (Signed)
Rescheduled patients lab appointment for 09/23 per patient's request.

## 2019-07-15 ENCOUNTER — Inpatient Hospital Stay: Payer: Medicare Other

## 2019-07-15 LAB — KAPPA/LAMBDA LIGHT CHAINS
Kappa free light chain: 104.3 mg/L — ABNORMAL HIGH (ref 3.3–19.4)
Kappa, lambda light chain ratio: 4.26 — ABNORMAL HIGH (ref 0.26–1.65)
Lambda free light chains: 24.5 mg/L (ref 5.7–26.3)

## 2019-07-16 LAB — MULTIPLE MYELOMA PANEL, SERUM
Albumin SerPl Elph-Mcnc: 3.3 g/dL (ref 2.9–4.4)
Albumin/Glob SerPl: 1.2 (ref 0.7–1.7)
Alpha 1: 0.2 g/dL (ref 0.0–0.4)
Alpha2 Glob SerPl Elph-Mcnc: 0.8 g/dL (ref 0.4–1.0)
B-Globulin SerPl Elph-Mcnc: 0.8 g/dL (ref 0.7–1.3)
Gamma Glob SerPl Elph-Mcnc: 1.1 g/dL (ref 0.4–1.8)
Globulin, Total: 3 g/dL (ref 2.2–3.9)
IgA: 182 mg/dL (ref 64–422)
IgG (Immunoglobin G), Serum: 1357 mg/dL (ref 586–1602)
IgM (Immunoglobulin M), Srm: 16 mg/dL — ABNORMAL LOW (ref 26–217)
M Protein SerPl Elph-Mcnc: 0.4 g/dL — ABNORMAL HIGH
Total Protein ELP: 6.3 g/dL (ref 6.0–8.5)

## 2019-07-22 ENCOUNTER — Other Ambulatory Visit: Payer: Self-pay | Admitting: *Deleted

## 2019-07-22 ENCOUNTER — Encounter: Payer: Self-pay | Admitting: Hematology and Oncology

## 2019-07-22 ENCOUNTER — Inpatient Hospital Stay: Payer: Medicare Other | Attending: Hematology and Oncology | Admitting: Hematology and Oncology

## 2019-07-22 ENCOUNTER — Other Ambulatory Visit: Payer: Self-pay

## 2019-07-22 VITALS — BP 143/73 | HR 68 | Temp 98.0°F | Resp 18 | Wt 153.2 lb

## 2019-07-22 DIAGNOSIS — E559 Vitamin D deficiency, unspecified: Secondary | ICD-10-CM | POA: Diagnosis not present

## 2019-07-22 DIAGNOSIS — D638 Anemia in other chronic diseases classified elsewhere: Secondary | ICD-10-CM | POA: Insufficient documentation

## 2019-07-22 DIAGNOSIS — Z23 Encounter for immunization: Secondary | ICD-10-CM | POA: Insufficient documentation

## 2019-07-22 DIAGNOSIS — C241 Malignant neoplasm of ampulla of Vater: Secondary | ICD-10-CM | POA: Insufficient documentation

## 2019-07-22 DIAGNOSIS — D472 Monoclonal gammopathy: Secondary | ICD-10-CM | POA: Insufficient documentation

## 2019-07-22 DIAGNOSIS — Z79899 Other long term (current) drug therapy: Secondary | ICD-10-CM | POA: Diagnosis not present

## 2019-07-22 DIAGNOSIS — M329 Systemic lupus erythematosus, unspecified: Secondary | ICD-10-CM | POA: Diagnosis not present

## 2019-07-22 MED ORDER — INFLUENZA VAC A&B SA ADJ QUAD 0.5 ML IM PRSY
PREFILLED_SYRINGE | INTRAMUSCULAR | Status: AC
  Filled 2019-07-22: qty 0.5

## 2019-07-22 MED ORDER — INFLUENZA VAC A&B SA ADJ QUAD 0.5 ML IM PRSY: 0.5000 mL | PREFILLED_SYRINGE | Freq: Once | INTRAMUSCULAR | Status: DC

## 2019-07-22 MED ORDER — INFLUENZA VAC A&B SA ADJ QUAD 0.5 ML IM PRSY
0.5000 mL | PREFILLED_SYRINGE | Freq: Once | INTRAMUSCULAR | Status: AC
  Administered 2019-07-22: 0.5 mL via INTRAMUSCULAR

## 2019-07-22 NOTE — Assessment & Plan Note (Addendum)
Her recent myeloma panel did not show evidence of disease progression We discussed the natural history of MGUS. I will see her on a yearly basis with repeat blood work.  We discussed the importance of preventive care and reviewed the vaccination programs. She does not have any prior allergic reactions to influenza vaccination. She agrees to proceed with influenza vaccination today and we will administer it today at the clinic.

## 2019-07-22 NOTE — Assessment & Plan Note (Signed)
I have reviewed her outside records extensively Postoperatively, she developed multiple complications after her Whipple's procedure with severe malnutrition, bowel obstruction, weight loss and others She was not able to receive adjuvant chemotherapy Her most recent CT imaging on May 12, 2019 showed nonspecific lymphadenopathy I would defer to her local surgeon for follow-up and recommendations in regards to adjuvant treatment She has appointment pending next month including imaging study If the patient needs adjuvant chemotherapy, we can certainly offer her treatment locally.  She will call me and let me know the final recommendations and decisions.

## 2019-07-22 NOTE — Progress Notes (Signed)
[facility] OFFICE PROGRESS NOTE  Patient Care Team: Willey Blade, MD as PCP - General (Internal Medicine) Bo Merino, MD as Consulting Physician (Rheumatology) Heath Lark, MD as Consulting Physician (Hematology and Oncology)  ASSESSMENT & PLAN:  MGUS (monoclonal gammopathy of unknown significance) Her recent myeloma panel did not show evidence of disease progression We discussed the natural history of MGUS. I will see her on a yearly basis with repeat blood work.  We discussed the importance of preventive care and reviewed the vaccination programs. She does not have any prior allergic reactions to influenza vaccination. She agrees to proceed with influenza vaccination today and we will administer it today at the clinic.   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[facility]) I have reviewed her outside records extensively Postoperatively, she developed multiple complications after her Whipple's procedure with severe malnutrition, bowel obstruction, weight loss and others She was not able to receive adjuvant chemotherapy Her most recent CT imaging on May 12, 2019 showed nonspecific lymphadenopathy I would defer to her local surgeon for follow-up and recommendations in regards to adjuvant treatment She has appointment pending next month including imaging study If the patient needs adjuvant chemotherapy, we can certainly offer her treatment locally.  She will call me and let me know the final recommendations and decisions.  Anemia, chronic disease This is likely anemia of chronic disease. The patient denies recent history of bleeding such as epistaxis, hematuria or hematochezia. She is asymptomatic from the anemia. We will observe for now.    Orders Placed This Encounter  Procedures  . Comprehensive metabolic panel    Standing Status:   Future    Standing Expiration Date:   08/25/2020  . CBC with Differential/Platelet    Standing Status:   Future    Standing Expiration Date:    08/25/2020  . Kappa/lambda light chains    Standing Status:   Future    Standing Expiration Date:   08/25/2020  . Multiple Myeloma Panel (SPEP&IFE w/QIG)    Standing Status:   Future    Standing Expiration Date:   08/25/2020    INTERVAL HISTORY: Please see below for problem oriented charting. She returns for further follow-up I have reviewed her electronic records She underwent Whipple's procedure for ampullary cancer, complicated by bowel obstruction as well as malnutrition She has lost a lot of weight She is doing well now She is able to tolerate oral intake and her weight has stabilized Denies recent infection, fever or chills  SUMMARY OF ONCOLOGIC HISTORY:  Kimberly Vazquez is here because of recently discovered IgG kappa MGUS. This patient has diffuse joint pain and was recently diagnosed with systemic lupus. She was on hydroxychloroquine a week ago. The patient is a high-dose vitamin D supplement for vitamin D deficiency. She has joint pain throughout her body, worse in the lumbar region, shoulders, hips and her right leg. The patient also had bladder prolapse with neurologic bladder and she self catheterize twice a day for many years The patient was diagnosed with ampullary cancer earlier this year and underwent Whipple's procedure Postoperatively, she developed severe complications requiring feeding tube placement and TPN She is recovering well Unfortunately, due to complications, she was not able to receive adjuvant treatment  REVIEW OF SYSTEMS:   Constitutional: Denies fevers, chills or abnormal weight loss Eyes: Denies blurriness of vision Ears, nose, mouth, throat, and face: Denies mucositis or sore throat Respiratory: Denies cough, dyspnea or wheezes Cardiovascular: Denies palpitation, chest discomfort or lower extremity swelling Gastrointestinal:  Denies nausea, heartburn or change in bowel habits Skin: Denies abnormal skin rashes Lymphatics: Denies new lymphadenopathy  or easy bruising Neurological:Denies numbness, tingling or new weaknesses Behavioral/Psych: Mood is stable, no new changes  All other systems were reviewed with the patient and are negative.  I have reviewed the past medical history, past surgical history, social history and family history with the patient and they are unchanged from previous note.  ALLERGIES:  has No Known Allergies.  MEDICATIONS:  Current Outpatient Medications  Medication Sig Dispense Refill  . cholecalciferol (VITAMIN D) 1000 UNITS tablet Take 1,000 Units by mouth daily.     . hydroxychloroquine (PLAQUENIL) 200 MG tablet TAKE ONE TABLET BY MOUTH TWICE A DAY MONDAY THROUGH FRIDAY. 120 tablet 0  . Multiple Vitamin (MULTIVITAMIN WITH MINERALS) TABS tablet Take 1 tablet by mouth daily.    . nitrofurantoin (MACRODANTIN) 50 MG capsule Take 50 mg by mouth daily.     . nortriptyline (PAMELOR) 25 MG capsule Take 1 capsule (25 mg total) by mouth daily. 90 capsule 4  . Omega-3 Fatty Acids (FISH OIL) 1000 MG CPDR Take 1,000 mg by mouth daily.     Marland Kitchen OVER THE COUNTER MEDICATION Take 1 capsule by mouth daily. FlameRX - proprietary blend of turmeric, resveratrol, B vitamins    . valsartan (DIOVAN) 160 MG tablet Take 160 mg by mouth daily.     No current facility-administered medications for this visit.     PHYSICAL EXAMINATION: ECOG PERFORMANCE STATUS: 1 - Symptomatic but completely ambulatory  Vitals:   07/22/19 1135  BP: (!) 143/73  Pulse: 68  Resp: 18  Temp: 98 F (36.7 C)  SpO2: 100%   Filed Weights   07/22/19 1135  Weight: 153 lb 3.2 oz (69.5 kg)    GENERAL:alert, no distress and comfortable SKIN: skin color, texture, turgor are normal, no rashes or significant lesions EYES: normal, Conjunctiva are pink and non-injected, sclera clear OROPHARYNX:no exudate, no erythema and lips, buccal mucosa, and tongue normal  NECK: supple, thyroid normal size, non-tender, without nodularity LYMPH:  no palpable lymphadenopathy  in the cervical, axillary or inguinal LUNGS: clear to auscultation and percussion with normal breathing effort HEART: regular rate & rhythm and no murmurs and no lower extremity edema ABDOMEN:abdomen soft, non-tender and normal bowel sounds Musculoskeletal:no cyanosis of digits and no clubbing  NEURO: alert & oriented x 3 with fluent speech, no focal motor/sensory deficits  LABORATORY DATA:  I have reviewed the data as listed    Component Value Date/Time   NA 139 07/14/2019 1136   NA 139 07/04/2017 1120   K 4.3 07/14/2019 1136   K 3.7 07/04/2017 1120   CL 106 07/14/2019 1136   CO2 26 07/14/2019 1136   CO2 26 07/04/2017 1120   GLUCOSE 96 07/14/2019 1136   GLUCOSE 82 07/04/2017 1120   BUN 16 07/14/2019 1136   BUN 15.8 07/04/2017 1120   CREATININE 0.86 07/14/2019 1136   CREATININE 0.97 (H) 09/01/2018 1424   CREATININE 1.0 07/04/2017 1120   CALCIUM 9.1 07/14/2019 1136   CALCIUM 9.5 07/04/2017 1120   PROT 6.7 07/14/2019 1136   PROT 7.6 07/04/2017 1120   PROT 6.7 07/04/2017 1120   ALBUMIN 3.5 07/14/2019 1136   ALBUMIN 3.4 (L) 07/04/2017 1120   AST 19 07/14/2019 1136   AST 17 07/04/2017 1120   ALT 20 07/14/2019 1136   ALT 12 07/04/2017 1120   ALKPHOS 119 07/14/2019 1136   ALKPHOS 90 07/04/2017 1120   BILITOT 0.5 07/14/2019  1136   BILITOT 0.56 07/04/2017 1120   GFRNONAA >60 07/14/2019 1136   GFRNONAA 58 (L) 09/01/2018 1424   GFRAA >60 07/14/2019 1136   GFRAA 68 09/01/2018 1424    No results found for: SPEP, UPEP  Lab Results  Component Value Date   WBC 5.5 07/14/2019   NEUTROABS 3.6 07/14/2019   HGB 10.7 (L) 07/14/2019   HCT 34.5 (L) 07/14/2019   MCV 82.9 07/14/2019   PLT 198 07/14/2019      Chemistry      Component Value Date/Time   NA 139 07/14/2019 1136   NA 139 07/04/2017 1120   K 4.3 07/14/2019 1136   K 3.7 07/04/2017 1120   CL 106 07/14/2019 1136   CO2 26 07/14/2019 1136   CO2 26 07/04/2017 1120   BUN 16 07/14/2019 1136   BUN 15.8 07/04/2017 1120    CREATININE 0.86 07/14/2019 1136   CREATININE 0.97 (H) 09/01/2018 1424   CREATININE 1.0 07/04/2017 1120      Component Value Date/Time   CALCIUM 9.1 07/14/2019 1136   CALCIUM 9.5 07/04/2017 1120   ALKPHOS 119 07/14/2019 1136   ALKPHOS 90 07/04/2017 1120   AST 19 07/14/2019 1136   AST 17 07/04/2017 1120   ALT 20 07/14/2019 1136   ALT 12 07/04/2017 1120   BILITOT 0.5 07/14/2019 1136   BILITOT 0.56 07/04/2017 1120      All questions were answered. The patient knows to call the clinic with any problems, questions or concerns. No barriers to learning was detected.  I spent 15 minutes counseling the patient face to face. The total time spent in the appointment was 20 minutes and more than 50% was on counseling and review of test results  Heath Lark, MD 07/22/2019 12:37 PM

## 2019-07-22 NOTE — Assessment & Plan Note (Signed)
This is likely anemia of chronic disease. The patient denies recent history of bleeding such as epistaxis, hematuria or hematochezia. She is asymptomatic from the anemia. We will observe for now.  

## 2019-07-23 ENCOUNTER — Telehealth: Payer: Self-pay | Admitting: Hematology and Oncology

## 2019-07-23 NOTE — Telephone Encounter (Signed)
I talk with patient regarding schedule  

## 2019-08-26 ENCOUNTER — Other Ambulatory Visit: Payer: Self-pay | Admitting: Physician Assistant

## 2019-08-26 DIAGNOSIS — M359 Systemic involvement of connective tissue, unspecified: Secondary | ICD-10-CM

## 2019-08-26 NOTE — Telephone Encounter (Signed)
ok 

## 2019-08-26 NOTE — Telephone Encounter (Signed)
Last Visit:09/01/2018 Next Visit:message sent to the front desk to schedule.( patient has not scheduled at this time due to going to [facility] for cancer treatments. Labs:07/14/19 Hgb 10.7, Hct 34.5, MCH 25.7  Eye exam:09/01/2018  Okay to refillPLQ?

## 2019-09-30 ENCOUNTER — Telehealth: Payer: Self-pay | Admitting: *Deleted

## 2019-09-30 DIAGNOSIS — M359 Systemic involvement of connective tissue, unspecified: Secondary | ICD-10-CM

## 2019-09-30 MED ORDER — HYDROXYCHLOROQUINE SULFATE 200 MG PO TABS: ORAL_TABLET | ORAL | 0 refills | Status: DC

## 2019-09-30 NOTE — Addendum Note (Signed)
Addended by: Earnestine Mealing on: 09/30/2019 02:19 PM   Modules accepted: Orders

## 2019-09-30 NOTE — Progress Notes (Signed)
Virtual Visit via Telephone Note  I connected with Kimberly Vazquez on 10/01/19 at  9:55 AM EST by telephone and verified that I am speaking with the correct person using two identifiers.  Location: Patient: Home  Provider: Clinic  This service was conducted via virtual visit.  The patient was located at home. I was located in my office.  Consent was obtained prior to the virtual visit and is aware of possible charges through their insurance for this visit.  The patient is an established patient.  Dr. Estanislado Pandy, MD conducted the virtual visit and Hazel Sams, PA-C acted as scribe during the service.  Office staff helped with scheduling follow up visits after the service was conducted.     I discussed the limitations, risks, security and privacy concerns of performing an evaluation and management service by telephone and the availability of in person appointments. I also discussed with the patient that there may be a patient responsible charge related to this service. The patient expressed understanding and agreed to proceed.  CC: medication monitoring  History of Present Illness: Patient is a 73 year old female with a past medical history of autoimmune disease, osteoarthritis, and DDD.  She is taking plaquenil 200 mg 1 tablet by mouth twice daily M-F.  She has not had any signs or symptoms of a flare recently. She denies any joint pain or joint swelling. She has rashes on both elbows and uses triamcinolone cream topically.  She wears sunscreen daily. She denies any sores in mouth or nose.  She denies any sicca symptoms.  She continues to have chronic fatigue. She denies any SOB or chest pain.   She had a whipple procedure in March 2020 at [facility].  She has been progressing slowly.  Her nausea and vomiting has resolved.  According to the patient there are no signs of cancer at this time.    Review of Systems  Constitutional: Positive for malaise/fatigue. Negative for fever.  Eyes: Negative  for photophobia, pain, discharge and redness.  Respiratory: Negative for cough, shortness of breath and wheezing.   Cardiovascular: Negative for chest pain and palpitations.  Gastrointestinal: Negative for blood in stool, constipation and diarrhea.  Genitourinary: Negative for dysuria.  Musculoskeletal: Positive for myalgias. Negative for back pain, joint pain and neck pain.  Skin: Positive for rash (On bilateral elbows).  Neurological: Negative for dizziness and headaches.  Psychiatric/Behavioral: Negative for depression. The patient has insomnia. The patient is not nervous/anxious.       Observations/Objective: Physical Exam  Constitutional: She is oriented to person, place, and time.  Neurological: She is alert and oriented to person, place, and time.  Psychiatric: Mood, memory, affect and judgment normal.    Patient reports morning stiffness for 0 minutes.   Patient denies nocturnal pain.  Difficulty dressing/grooming: Denies Difficulty climbing stairs: Reports Difficulty getting out of chair: Denies Difficulty using hands for taps, buttons, cutlery, and/or writing: Denies   Assessment and Plan: Visit Diagnoses: Autoimmune disease ([place]) - +ANA: She has not had any signs or symptoms of a flare recently. She is clinically doing well on Plaquenil 200 mg 1 tablet by mouth twice daily M-F. She has not missed any doses recently. She has no joint pain, joint swelling or morning stiffness at this time.  She has not had any symptoms of Raynaud's, oral or nasal ulcerations, or sicca symptoms.  She has a rash on both elbows, and she was advised to apply triamcinolone topically as needed.  She has  not had any chest pain or SOB.  She has chronic fatigue but no enlarged lymph nodes or fevers recently.  She underwent the whipple procedure in march 2020, and she has slowly been progressing and is cancer free at this time according to the patient. She will continue taking plaquenil 200 mg 1 tablet by  mouth twice daily M-F.  She does not need a refill at this time.  Autoimmune labs will be ordered today.  She was advised to notify us if she develops any new or worsening symptoms.  She will follow in 3-4 months.     High risk medication use - Plaquenil 200 mg 1 tablet BID M-F. CBC and CMP were drawn on 07/14/19.  PLQ eye exam: Last Visit: 09/01/18 WNL Dr. Clent Jacks.  She is due to update PLQ eye exam.   Rheumatoid factor positive: She has not had any joint inflammation recently.   Primary osteoarthritis of both hands: She has no hand pain or joint swelling currently.  Joint protection and muscle strengthening were discussed.  Primary osteoarthritis of both knees: She has difficulty climbing steps.  She has muscle deconditioning since she has been more sedentary s/p whipple procedure.  We will mail her a handout of knee joint exercises.  The importance of lower extremity and fall prevention were discussed.   DDD (degenerative disc disease), cervical: She is not having any neck pain or stiffness at this time.  No symptoms of radiculopathy.  DDD (degenerative disc disease), lumbar: She has chronic lower back pain. We will mail her back exercises to start performing.   History of vitamin D deficiency: She is taking vitamin D 2,000 units by mouth daily.   Other medical conditions are listed as follows:   Primary insomnia  History of hypertension  History of recurrent UTIs  CKD (chronic kidney disease), stage III (HCC)-stable.  MGUS (monoclonal gammopathy of unknown significance) -she is followed by Dr. Alvy Bimler.   Follow Up Instructions: She will follow up in 3-4 months.    I discussed the assessment and treatment plan with the patient. The patient was provided an opportunity to ask questions and all were answered. The patient agreed with the plan and demonstrated an understanding of the instructions.   The patient was advised to call back or seek an in-person evaluation  if the symptoms worsen or if the condition fails to improve as anticipated.  I provided 25 minutes of non-face-to-face time during this encounter.   Bo Merino, MD   Scribed by-  Hazel Sams, PA-C

## 2019-09-30 NOTE — Telephone Encounter (Signed)
Last Visit: 09/01/2018 Next Visit: 10/01/2019  Labs: 07/14/2019 hemoglobin 10.7, HCT 34.5. MCH 25.7 Eye exam: 09/01/2018    Advised patient she is due to update eye exam, patient verbalized understanding and will get it scheduled.   Okay to refill per Dr. Estanislado Pandy.

## 2019-09-30 NOTE — Telephone Encounter (Signed)
Patient LMOM requesting RF on PLQ [phone].

## 2019-10-01 ENCOUNTER — Other Ambulatory Visit: Payer: Self-pay

## 2019-10-01 ENCOUNTER — Telehealth (INDEPENDENT_AMBULATORY_CARE_PROVIDER_SITE_OTHER): Payer: Medicare Other | Admitting: Rheumatology

## 2019-10-01 ENCOUNTER — Encounter: Payer: Self-pay | Admitting: Rheumatology

## 2019-10-01 DIAGNOSIS — R768 Other specified abnormal immunological findings in serum: Secondary | ICD-10-CM | POA: Diagnosis not present

## 2019-10-01 DIAGNOSIS — M5136 Other intervertebral disc degeneration, lumbar region: Secondary | ICD-10-CM

## 2019-10-01 DIAGNOSIS — M359 Systemic involvement of connective tissue, unspecified: Secondary | ICD-10-CM

## 2019-10-01 DIAGNOSIS — Z79899 Other long term (current) drug therapy: Secondary | ICD-10-CM

## 2019-10-01 DIAGNOSIS — M170 Bilateral primary osteoarthritis of knee: Secondary | ICD-10-CM

## 2019-10-01 DIAGNOSIS — Z8679 Personal history of other diseases of the circulatory system: Secondary | ICD-10-CM

## 2019-10-01 DIAGNOSIS — D472 Monoclonal gammopathy: Secondary | ICD-10-CM

## 2019-10-01 DIAGNOSIS — M5030 Other cervical disc degeneration, unspecified cervical region: Secondary | ICD-10-CM

## 2019-10-01 DIAGNOSIS — F5101 Primary insomnia: Secondary | ICD-10-CM

## 2019-10-01 DIAGNOSIS — Z8639 Personal history of other endocrine, nutritional and metabolic disease: Secondary | ICD-10-CM

## 2019-10-01 DIAGNOSIS — Z87440 Personal history of urinary (tract) infections: Secondary | ICD-10-CM

## 2019-10-01 DIAGNOSIS — M51369 Other intervertebral disc degeneration, lumbar region without mention of lumbar back pain or lower extremity pain: Secondary | ICD-10-CM

## 2019-10-01 DIAGNOSIS — M19042 Primary osteoarthritis, left hand: Secondary | ICD-10-CM

## 2019-10-01 DIAGNOSIS — M19041 Primary osteoarthritis, right hand: Secondary | ICD-10-CM | POA: Diagnosis not present

## 2019-10-12 ENCOUNTER — Telehealth: Payer: Self-pay | Admitting: Rheumatology

## 2019-10-12 NOTE — Telephone Encounter (Signed)
LMOM for patient to call and schedule 3-4 month follow-up appointment.

## 2019-10-12 NOTE — Telephone Encounter (Signed)
-----   Message from Shona Needles, RT sent at 10/01/2019 12:46 PM EST ----- Regarding: 3-4 MONTH F/U

## 2019-12-10 ENCOUNTER — Other Ambulatory Visit: Payer: Self-pay | Admitting: Internal Medicine

## 2019-12-10 DIAGNOSIS — Z1231 Encounter for screening mammogram for malignant neoplasm of breast: Secondary | ICD-10-CM

## 2020-01-13 ENCOUNTER — Ambulatory Visit: Payer: Medicare Other

## 2020-01-31 ENCOUNTER — Other Ambulatory Visit: Payer: Self-pay

## 2020-01-31 ENCOUNTER — Ambulatory Visit
Admission: RE | Admit: 2020-01-31 | Discharge: 2020-01-31 | Disposition: A | Discharge status: Home / Self Care | Payer: Medicare PPO | Source: Ambulatory Visit | Attending: Internal Medicine | Admitting: Internal Medicine

## 2020-01-31 DIAGNOSIS — Z1231 Encounter for screening mammogram for malignant neoplasm of breast: Secondary | ICD-10-CM

## 2020-05-27 ENCOUNTER — Other Ambulatory Visit: Payer: Self-pay | Admitting: Rheumatology

## 2020-05-27 ENCOUNTER — Other Ambulatory Visit (INDEPENDENT_AMBULATORY_CARE_PROVIDER_SITE_OTHER): Payer: Self-pay | Admitting: Specialist

## 2020-05-27 DIAGNOSIS — M359 Systemic involvement of connective tissue, unspecified: Secondary | ICD-10-CM

## 2020-05-29 ENCOUNTER — Other Ambulatory Visit: Payer: Self-pay | Admitting: Rheumatology

## 2020-05-29 DIAGNOSIS — M359 Systemic involvement of connective tissue, unspecified: Secondary | ICD-10-CM

## 2020-05-29 NOTE — Telephone Encounter (Signed)
Last Visit: 10/01/2019 Next Visit: due April 2021.  Labs: 07/13/2020 Hgb 10.7, HCT 34.5, MCH 25.7 Eye exam: 10/07/2019 WNL   Current Dose per office note 10/01/2019: - Plaquenil 200 mg 1 tablet BID M-F. OY:DXAJOINOMV disease   Attempted to contact patient and left message to advise patient she id due for a follow up visit and labs.  Okay to refill Plaquenil?

## 2020-05-29 NOTE — Telephone Encounter (Signed)
We have not seen patient since December.  This will be the last refill on Plaquenil.  Please schedule an appointment for this month and send her a letter to notify about the appointment.  If she does not keep the appointment we will discharge the patient.

## 2020-05-29 NOTE — Telephone Encounter (Signed)
Left message for patient today to advise patient she is due for a follow up visit.

## 2020-06-22 ENCOUNTER — Other Ambulatory Visit: Payer: Self-pay | Admitting: Rheumatology

## 2020-06-22 DIAGNOSIS — M359 Systemic involvement of connective tissue, unspecified: Secondary | ICD-10-CM

## 2020-07-13 ENCOUNTER — Other Ambulatory Visit: Payer: Medicare Other

## 2020-07-14 ENCOUNTER — Inpatient Hospital Stay: Payer: Medicare PPO | Attending: Internal Medicine

## 2020-07-20 ENCOUNTER — Other Ambulatory Visit: Payer: Self-pay | Admitting: Rheumatology

## 2020-07-20 ENCOUNTER — Ambulatory Visit: Payer: Medicare Other | Admitting: Hematology and Oncology

## 2020-07-20 ENCOUNTER — Other Ambulatory Visit: Payer: Self-pay

## 2020-07-20 DIAGNOSIS — M359 Systemic involvement of connective tissue, unspecified: Secondary | ICD-10-CM

## 2020-07-20 MED ORDER — HYDROXYCHLOROQUINE SULFATE 200 MG PO TABS: ORAL_TABLET | ORAL | 0 refills | Status: DC

## 2020-07-20 NOTE — Telephone Encounter (Signed)
Lab work was reviewed from 07/14/20 today.  She is followed closely by oncology  and had a recent appointment on 07/13/20.    Please clarify if the patient would like a 30 day supply or 90 day supply?

## 2020-07-20 NOTE — Telephone Encounter (Signed)
Patient left a voicemail requesting prescription refill of Hydroxychloroquine to be sent to [facility] at [address]

## 2020-07-20 NOTE — Telephone Encounter (Signed)
Last Visit: 10/01/2019 Next Visit: 07/24/2020 Labs: 07/13/2020 RBC 2.72, Hgb 7.9, Hct 25.2, RDW 19.9, Potassium 3.3, Chloride 109, Calcium 7.8 Eye exam: 10/07/2019 WNL   Current Dose per office note 10/01/2019: plaquenil 200 mg 1 tablet by mouth twice daily M-F DX: Autoimmune disease   Okay to refill Plaquenil?

## 2020-07-21 ENCOUNTER — Encounter: Payer: Self-pay | Admitting: Hematology and Oncology

## 2020-07-21 ENCOUNTER — Telehealth: Payer: Self-pay

## 2020-07-21 ENCOUNTER — Inpatient Hospital Stay: Payer: Medicare PPO | Attending: Hematology and Oncology | Admitting: Hematology and Oncology

## 2020-07-21 ENCOUNTER — Other Ambulatory Visit: Payer: Self-pay

## 2020-07-21 VITALS — BP 130/75 | HR 91 | Temp 97.5°F | Resp 18 | Ht 61.5 in | Wt 135.8 lb

## 2020-07-21 DIAGNOSIS — M25551 Pain in right hip: Secondary | ICD-10-CM | POA: Insufficient documentation

## 2020-07-21 DIAGNOSIS — M25519 Pain in unspecified shoulder: Secondary | ICD-10-CM | POA: Diagnosis not present

## 2020-07-21 DIAGNOSIS — E559 Vitamin D deficiency, unspecified: Secondary | ICD-10-CM | POA: Insufficient documentation

## 2020-07-21 DIAGNOSIS — Z23 Encounter for immunization: Secondary | ICD-10-CM | POA: Diagnosis not present

## 2020-07-21 DIAGNOSIS — Z299 Encounter for prophylactic measures, unspecified: Secondary | ICD-10-CM | POA: Diagnosis not present

## 2020-07-21 DIAGNOSIS — D472 Monoclonal gammopathy: Secondary | ICD-10-CM | POA: Insufficient documentation

## 2020-07-21 DIAGNOSIS — C241 Malignant neoplasm of ampulla of Vater: Secondary | ICD-10-CM

## 2020-07-21 DIAGNOSIS — M25552 Pain in left hip: Secondary | ICD-10-CM | POA: Diagnosis not present

## 2020-07-21 DIAGNOSIS — Z79899 Other long term (current) drug therapy: Secondary | ICD-10-CM | POA: Diagnosis not present

## 2020-07-21 MED ORDER — INFLUENZA VAC A&B SA ADJ QUAD 0.5 ML IM PRSY
0.5000 mL | PREFILLED_SYRINGE | Freq: Once | INTRAMUSCULAR | Status: AC
  Administered 2020-07-21: 0.5 mL via INTRAMUSCULAR

## 2020-07-21 MED ORDER — INFLUENZA VAC A&B SA ADJ QUAD 0.5 ML IM PRSY
PREFILLED_SYRINGE | INTRAMUSCULAR | Status: AC
  Filled 2020-07-21: qty 0.5

## 2020-07-21 NOTE — Assessment & Plan Note (Signed)
I have reviewed her outside records Apparently, the patient have evidence of disease in the liver and is receiving palliative chemotherapy over the last few months Based on last review, her imaging study was stable She is due for upcoming repeat imaging study I will defer to her oncologist at [facility] for further follow-up She is getting excellent care and does not need to transfer her care to locally

## 2020-07-21 NOTE — Telephone Encounter (Signed)
Patient left a voicemail last night 07/20/20 at 4:55 pm checking on the status of her prescription refill of Hydroxychloroquine.

## 2020-07-21 NOTE — Assessment & Plan Note (Signed)
We discussed the importance of preventive care and reviewed the vaccination programs. She does not have any prior allergic reactions to influenza vaccination. She agrees to proceed with influenza vaccination today and we will administer it today at the clinic.  

## 2020-07-21 NOTE — Progress Notes (Deleted)
Office Visit Note  Patient: Kimberly Vazquez             Date of Birth: [birth year]           MRN: [id number]             PCP: Willey Blade, MD Referring: Willey Blade, MD Visit Date: 07/24/2020 Occupation: @GUAROCC @  Subjective:  No chief complaint on file.   History of Present Illness: Kimberly Vazquez is a 74 y.o. female ***   Activities of Daily Living:  Patient reports morning stiffness for *** {minute/hour:19697}.   Patient {ACTIONS;DENIES/REPORTS:21021675::"Denies"} nocturnal pain.  Difficulty dressing/grooming: {ACTIONS;DENIES/REPORTS:21021675::"Denies"} Difficulty climbing stairs: {ACTIONS;DENIES/REPORTS:21021675::"Denies"} Difficulty getting out of chair: {ACTIONS;DENIES/REPORTS:21021675::"Denies"} Difficulty using hands for taps, buttons, cutlery, and/or writing: {ACTIONS;DENIES/REPORTS:21021675::"Denies"}  No Rheumatology ROS completed.   PMFS History:  Patient Active Problem List   Diagnosis Date Noted  . 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 Anemia, chronic disease 07/22/2019  . Protein-calorie malnutrition, severe 11/11/2018  . Non-intractable vomiting   . Acute renal failure (ARF) ([place]) 11/08/2018  . Essential hypertension 11/08/2018  . Tubulovillous adenoma of small intestine   . AKI (acute kidney injury) ([place])   . Post-ERCP acute pancreatitis 11/01/2018  . Biliary obstruction 10/31/2018  . Obstructive jaundice   . Biliary tract imaging abnormality   . Dehydration   . Dizziness 07/14/2018  . Hemorrhoids 07/14/2018  . Noncompliance with medication regimen 03/07/2017  . Rheumatoid factor positive 03/07/2017  . Primary insomnia 03/07/2017  . Primary osteoarthritis of both hands 03/07/2017  . Primary osteoarthritis of both knees 03/07/2017  . History of recurrent UTIs 03/07/2017  . Vitamin D deficiency 03/07/2017  . High risk medication use 08/29/2016  . Preventive measure 07/07/2015  . Autoimmune disease ([place]) 06/20/2014  . MGUS  (monoclonal gammopathy of unknown significance) 06/20/2014    Past Medical History:  Diagnosis Date  . Allergic rhinitis   . Anemia   . Arthritis   . Bladder disorder    "lazy" , caths twice a day  . Depression   . Fatty liver   . GERD (gastroesophageal reflux disease)   . Hypertension   . Seasonal allergies   . Systemic lupus erythematosus ([place])   . UTI (urinary tract infection)   . Vitamin D deficiency     Family History  Problem Relation Age of Onset  . Cancer Brother        unknown  . Kidney disease Son   . Diabetes Brother   . Heart disease Brother   . Colon cancer Neg Hx    Past Surgical History:  Procedure Laterality Date  . ABDOMINAL HYSTERECTOMY    . BIOPSY  10/31/2018   Procedure: BIOPSY;  Surgeon: Gatha Mayer, MD;  Location: [place];  Service: Endoscopy;;  . ENDOSCOPIC RETROGRADE CHOLANGIOPANCREATOGRAPHY (ERCP) WITH PROPOFOL N/A 10/31/2018   Procedure: ENDOSCOPIC RETROGRADE CHOLANGIOPANCREATOGRAPHY (ERCP) WITH PROPOFOL;  Surgeon: Gatha Mayer, MD;  [facility] ENDOSCOPY;  Service: Endoscopy;  Laterality: N/A;  . IR INT EXT BILIARY DRAIN WITH CHOLANGIOGRAM  11/03/2018  . IR RADIOLOGIST EVAL & MGMT  12/22/2018  . ROTATOR CUFF REPAIR Right 2012  . TONSILLECTOMY  1965   Social History   Social History Narrative  . Not on file   Immunization History  Administered Date(s) Administered  . Fluad Quad(high Dose 65+) 07/22/2019  . Influenza,inj,Quad PF,6+ Mos 07/07/2015, 07/05/2016, 07/14/2017  . Zoster Recombinat (Shingrix) 01/10/2018, 04/14/2018     Objective: Vital Signs: There were no vitals taken for this  visit.   Physical Exam   Musculoskeletal Exam: ***  CDAI Exam: CDAI Score: -- Patient Global: --; Provider Global: -- Swollen: --; Tender: -- Joint Exam 07/24/2020   No joint exam has been documented for this visit   There is currently no information documented on the homunculus. Go to the Rheumatology activity and complete the  homunculus joint exam.  Investigation: No additional findings.  Imaging: No results found.  Recent Labs: Lab Results  Component Value Date   WBC 5.5 07/14/2019   HGB 10.7 (L) 07/14/2019   PLT 198 07/14/2019   NA 139 07/14/2019   K 4.3 07/14/2019   CL 106 07/14/2019   CO2 26 07/14/2019   GLUCOSE 96 07/14/2019   BUN 16 07/14/2019   CREATININE 0.86 07/14/2019   BILITOT 0.5 07/14/2019   ALKPHOS 119 07/14/2019   AST 19 07/14/2019   ALT 20 07/14/2019   PROT 6.7 07/14/2019   ALBUMIN 3.5 07/14/2019   CALCIUM 9.1 07/14/2019   GFRAA >60 07/14/2019    Speciality Comments: PLQ eye exam: 10/07/2019 WNL Dr. Clent Jacks. Follow up in 1 year.  Procedures:  No procedures performed Allergies: Patient has no known allergies.   Assessment / Plan:     Visit Diagnoses: No diagnosis found.  Orders: No orders of the defined types were placed in this encounter.  No orders of the defined types were placed in this encounter.   Face-to-face time spent with patient was *** minutes. Greater than 50% of time was spent in counseling and coordination of care.  Follow-Up Instructions: No follow-ups on file.   Earnestine Mealing, CMA  Note - This record has been created using Editor, commissioning.  Chart creation errors have been sought, but may not always  have been located. Such creation errors do not reflect on  the standard of medical care.

## 2020-07-21 NOTE — Progress Notes (Signed)
Kimberly Vazquez OFFICE PROGRESS NOTE  Patient Care Team: Willey Blade, MD as PCP - General (Internal Medicine) Bo Merino, MD as Consulting Physician (Rheumatology) Heath Lark, MD as Consulting Physician (Hematology and Oncology)  ASSESSMENT & PLAN:  MGUS (monoclonal gammopathy of unknown significance) She did not have her myeloma panel done last week However, given the scope of her other medical illness, it is not likely that her MGUS will progress over the next few years I do not recommend we check this myeloma panel We will discontinue follow-up here  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Vater [facility]) I have reviewed her outside records Apparently, the patient have evidence of disease in the liver and is receiving palliative chemotherapy over the last few months Based on last review, her imaging study was stable She is due for upcoming repeat imaging study I will defer to her oncologist at [facility] for further follow-up She is getting excellent care and does not need to transfer her care to locally  Preventive measure We discussed the importance of preventive care and reviewed the vaccination programs. She does not have any prior allergic reactions to influenza vaccination. She agrees to proceed with influenza vaccination today and we will administer it today at the clinic.    No orders of the defined types were placed in this encounter.   All questions were answered. The patient knows to call the clinic with any problems, questions or concerns. The total time spent in the appointment was 20 minutes encounter with patients including review of chart and various tests results, discussions about plan of care and coordination of care plan   Heath Lark, MD 07/21/2020 12:01 PM  INTERVAL HISTORY: Please see below for problem oriented charting. She returns for further follow-up for history of MGUS She did not have her myeloma panel drawn last week She is undergoing  chemotherapy She has some side effects but overall tolerable She is very tearful about the prospect of her stage IV disease Several family members contracted COVID-19 but she tested negative several days ago  SUMMARY OF ONCOLOGIC HISTORY:  Kimberly Vazquez is here because of recently discovered IgG kappa MGUS. This patient has diffuse joint pain and was recently diagnosed with systemic lupus. She was on hydroxychloroquine a week ago. The patient is a high-dose vitamin D supplement for vitamin D deficiency. She has joint pain throughout her body, worse in the lumbar region, shoulders, hips and her right leg. The patient also had bladder prolapse with neurologic bladder and she self catheterize twice a day for many years The patient was diagnosed with ampullary cancer earlier in 2020 and underwent Whipple's procedure Postoperatively, she developed severe complications requiring feeding tube placement and TPN In 2020-2021, she received chemotherapy through [facility] I have reviewed her electronic records  REVIEW OF SYSTEMS:   Constitutional: Denies fevers, chills or abnormal weight loss Eyes: Denies blurriness of vision Ears, nose, mouth, throat, and face: Denies mucositis or sore throat Respiratory: Denies cough, dyspnea or wheezes Cardiovascular: Denies palpitation, chest discomfort or lower extremity swelling Gastrointestinal:  Denies nausea, heartburn or change in bowel habits Skin: Denies abnormal skin rashes Lymphatics: Denies new lymphadenopathy or easy bruising Neurological:Denies numbness, tingling or new weaknesses Behavioral/Psych: Mood is stable, no new changes  All other systems were reviewed with the patient and are negative.  I have reviewed the past medical history, past surgical history, social history and family history with the patient and they are unchanged from previous note.  ALLERGIES:  has No Known Allergies.  MEDICATIONS:  Current Outpatient Medications  Medication Sig  Dispense Refill  . cholecalciferol (VITAMIN D) 1000 UNITS tablet Take 1,000 Units by mouth daily.     . hydroxychloroquine (PLAQUENIL) 200 MG tablet Take 1 tablet by mouth twice daily M-F 40 tablet 0  . Multiple Vitamin (MULTIVITAMIN WITH MINERALS) TABS tablet Take 1 tablet by mouth daily.    . nitrofurantoin (MACRODANTIN) 50 MG capsule Take 50 mg by mouth daily.     . nortriptyline (PAMELOR) 25 MG capsule TAKE 1 CAPSULE BY MOUTH EVERY DAY 90 capsule 4  . Omega-3 Fatty Acids (FISH OIL) 1000 MG CPDR Take 1,000 mg by mouth daily.     Marland Kitchen OVER THE COUNTER MEDICATION Take 1 capsule by mouth daily. FlameRX - proprietary blend of turmeric, resveratrol, B vitamins    . valsartan (DIOVAN) 160 MG tablet Take 160 mg by mouth daily.     No current facility-administered medications for this visit.    PHYSICAL EXAMINATION: ECOG PERFORMANCE STATUS: 1 - Symptomatic but completely ambulatory  Vitals:   07/21/20 1109  BP: 130/75  Pulse: 91  Resp: 18  Temp: (!) 97.5 F (36.4 C)  SpO2: 100%   Filed Weights   07/21/20 1109  Weight: 135 lb 12.8 oz (61.6 kg)    GENERAL:alert, no distress and comfortable.  She is somewhat tearful.  She has infusion chemotherapy pump administering through her port NEURO: alert & oriented x 3 with fluent speech, no focal motor/sensory deficits  LABORATORY DATA:  I have reviewed the data as listed    Component Value Date/Time   NA 139 07/14/2019 1136   NA 139 07/04/2017 1120   K 4.3 07/14/2019 1136   K 3.7 07/04/2017 1120   CL 106 07/14/2019 1136   CO2 26 07/14/2019 1136   CO2 26 07/04/2017 1120   GLUCOSE 96 07/14/2019 1136   GLUCOSE 82 07/04/2017 1120   BUN 16 07/14/2019 1136   BUN 15.8 07/04/2017 1120   CREATININE 0.86 07/14/2019 1136   CREATININE 0.97 (H) 09/01/2018 1424   CREATININE 1.0 07/04/2017 1120   CALCIUM 9.1 07/14/2019 1136   CALCIUM 9.5 07/04/2017 1120   PROT 6.7 07/14/2019 1136   PROT 7.6 07/04/2017 1120   PROT 6.7 07/04/2017 1120   ALBUMIN  3.5 07/14/2019 1136   ALBUMIN 3.4 (L) 07/04/2017 1120   AST 19 07/14/2019 1136   AST 17 07/04/2017 1120   ALT 20 07/14/2019 1136   ALT 12 07/04/2017 1120   ALKPHOS 119 07/14/2019 1136   ALKPHOS 90 07/04/2017 1120   BILITOT 0.5 07/14/2019 1136   BILITOT 0.56 07/04/2017 1120   GFRNONAA >60 07/14/2019 1136   GFRNONAA 58 (L) 09/01/2018 1424   GFRAA >60 07/14/2019 1136   GFRAA 68 09/01/2018 1424    No results found for: SPEP, UPEP  Lab Results  Component Value Date   WBC 5.5 07/14/2019   NEUTROABS 3.6 07/14/2019   HGB 10.7 (L) 07/14/2019   HCT 34.5 (L) 07/14/2019   MCV 82.9 07/14/2019   PLT 198 07/14/2019      Chemistry      Component Value Date/Time   NA 139 07/14/2019 1136   NA 139 07/04/2017 1120   K 4.3 07/14/2019 1136   K 3.7 07/04/2017 1120   CL 106 07/14/2019 1136   CO2 26 07/14/2019 1136   CO2 26 07/04/2017 1120   BUN 16 07/14/2019 1136   BUN 15.8 07/04/2017 1120   CREATININE 0.86 07/14/2019 1136   CREATININE 0.97 (H) 09/01/2018  1424   CREATININE 1.0 07/04/2017 1120      Component Value Date/Time   CALCIUM 9.1 07/14/2019 1136   CALCIUM 9.5 07/04/2017 1120   ALKPHOS 119 07/14/2019 1136   ALKPHOS 90 07/04/2017 1120   AST 19 07/14/2019 1136   AST 17 07/04/2017 1120   ALT 20 07/14/2019 1136   ALT 12 07/04/2017 1120   BILITOT 0.5 07/14/2019 1136   BILITOT 0.56 07/04/2017 1120

## 2020-07-21 NOTE — Telephone Encounter (Signed)
Patient advised her prescription was sent to the pharmacy for a 30 day supply.

## 2020-07-21 NOTE — Assessment & Plan Note (Signed)
She did not have her myeloma panel done last week However, given the scope of her other medical illness, it is not likely that her MGUS will progress over the next few years I do not recommend we check this myeloma panel We will discontinue follow-up here

## 2020-07-24 ENCOUNTER — Ambulatory Visit: Payer: Medicare PPO | Admitting: Physician Assistant

## 2020-07-24 NOTE — Progress Notes (Signed)
Office Visit Note  Patient: Kimberly Vazquez             Date of Birth: [birth year]           MRN: [id number]             PCP: Willey Blade, MD Referring: Willey Blade, MD Visit Date: 07/28/2020 Occupation: @GUAROCC @  Subjective:  Fatigue   History of Present Illness: Kimberly Vazquez is a 74 y.o. female with history of autoimmune disease, osteoarthritis, and DDD.  Patient is taking Plaquenil 200 mg 1 tablet by mouth twice daily Monday through Friday.  She continues to tolerate Plaquenil without any side effects.  She denies any signs or symptoms of an autoimmune disease flare recently.  She denies any joint pain or joint swelling at this time.  She has occasional myalgias and arthralgias but no joint inflammation.  She has not had any recent rashes, photosensitivity, oral or nasal ulcerations, sicca symptoms, symptoms of Raynaud's, chest pain, or shortness of breath. She is followed closely by [facility] oncology for management of cancer of ampulla of vater. She has significant fatigue, muscle weakness, and loss of appetite.  She plans on trying to increase her exercise regimen and try supplementing with protein powder.  She will schedule an updated PLQ eye exam with Dr. Katy Fitch.   Activities of Daily Living:  Patient reports morning stiffness for 5-10 minutes.   Patient Reports nocturnal pain.  Difficulty dressing/grooming: Denies Difficulty climbing stairs: Denies Difficulty getting out of chair: Denies Difficulty using hands for taps, buttons, cutlery, and/or writing: Reports  Review of Systems  Constitutional: Positive for appetite change and fatigue.  HENT: Negative for mouth sores, mouth dryness and nose dryness.   Eyes: Positive for dryness. Negative for pain, itching and visual disturbance.  Respiratory: Negative for cough, hemoptysis, shortness of breath and difficulty breathing.   Cardiovascular: Negative for chest pain, palpitations, hypertension and swelling in legs/feet.   Gastrointestinal: Negative for abdominal pain, blood in stool, constipation and diarrhea.  Endocrine: Negative for increased urination.  Genitourinary: Negative for painful urination.  Musculoskeletal: Positive for arthralgias, joint pain, joint swelling, muscle weakness and morning stiffness. Negative for myalgias, muscle tenderness and myalgias.  Skin: Positive for color change. Negative for pallor, rash, hair loss, nodules/bumps, redness, skin tightness, ulcers and sensitivity to sunlight.       Hand/foot discoloration-SE of chemotherapy  Allergic/Immunologic: Positive for susceptible to infections.  Neurological: Negative for dizziness, numbness, headaches, memory loss and weakness.  Hematological: Negative for swollen glands.  Psychiatric/Behavioral: Positive for depressed mood. Negative for confusion and sleep disturbance. The patient is not nervous/anxious.     PMFS History:  Patient Active Problem List   Diagnosis Date Noted  . 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Vater ([place]) 07/22/2019  . Anemia, chronic disease 07/22/2019  . Protein-calorie malnutrition, severe 11/11/2018  . Non-intractable vomiting   . Acute renal failure (ARF) ([place]) 11/08/2018  . Essential hypertension 11/08/2018  . Tubulovillous adenoma of small intestine   . AKI (acute kidney injury) ([place])   . Post-ERCP acute pancreatitis 11/01/2018  . Biliary obstruction 10/31/2018  . Obstructive jaundice   . Biliary tract imaging abnormality   . Dehydration   . Dizziness 07/14/2018  . Hemorrhoids 07/14/2018  . Noncompliance with medication regimen 03/07/2017  . Rheumatoid factor positive 03/07/2017  . Primary insomnia 03/07/2017  . Primary osteoarthritis of both hands 03/07/2017  . Primary osteoarthritis of both knees 03/07/2017  . History of recurrent UTIs 03/07/2017  . Vitamin D deficiency  03/07/2017  . High risk medication use 08/29/2016  . Preventive measure 07/07/2015  . Autoimmune disease ([place]) 06/20/2014  . MGUS  (monoclonal gammopathy of unknown significance) 06/20/2014    Past Medical History:  Diagnosis Date  . Allergic rhinitis   . Anemia   . Arthritis   . Bladder disorder    "lazy" , caths twice a day  . Depression   . Fatty liver   . GERD (gastroesophageal reflux disease)   . Hypertension   . Seasonal allergies   . Systemic lupus erythematosus ([place])   . UTI (urinary tract infection)   . Vitamin D deficiency     Family History  Problem Relation Age of Onset  . Cancer Brother        unknown  . Kidney disease Son   . Diabetes Brother   . Heart disease Brother   . Colon cancer Neg Hx    Past Surgical History:  Procedure Laterality Date  . ABDOMINAL HYSTERECTOMY    . BIOPSY  10/31/2018   Procedure: BIOPSY;  Surgeon: Gatha Mayer, MD;  Location: [facility];  Service: Endoscopy;;  . ENDOSCOPIC RETROGRADE CHOLANGIOPANCREATOGRAPHY (ERCP) WITH PROPOFOL N/A 10/31/2018   Procedure: ENDOSCOPIC RETROGRADE CHOLANGIOPANCREATOGRAPHY (ERCP) WITH PROPOFOL;  Surgeon: Gatha Mayer, MD;  Location: [facility] ENDOSCOPY;  Service: Endoscopy;  Laterality: N/A;  . IR INT EXT BILIARY DRAIN WITH CHOLANGIOGRAM  11/03/2018  . IR RADIOLOGIST EVAL & MGMT  12/22/2018  . ROTATOR CUFF REPAIR Right 2012  . TONSILLECTOMY  1965   Social History   Social History Narrative  . Not on file   Immunization History  Administered Date(s) Administered  . Fluad Quad(high Dose 65+) 07/22/2019, 07/21/2020  . Influenza,inj,Quad PF,6+ Mos 07/07/2015, 07/05/2016, 07/14/2017  . PFIZER SARS-COV-2 Vaccination 11/22/2019, 12/17/2019  . Zoster Recombinat (Shingrix) 01/10/2018, 04/14/2018     Objective: Vital Signs: BP 128/84 (BP Location: Left Arm, Patient Position: Sitting, Cuff Size: Small)   Pulse 74   Ht 5\' 2"  (1.575 m)   Wt 134 lb (60.8 kg)   BMI 24.51 kg/m    Physical Exam Vitals and nursing note reviewed.  Constitutional:      Appearance: She is well-developed.  HENT:     Head: Normocephalic and atraumatic.    Eyes:     Conjunctiva/sclera: Conjunctivae normal.  Pulmonary:     Effort: Pulmonary effort is normal.  Abdominal:     Palpations: Abdomen is soft.  Musculoskeletal:     Cervical back: Normal range of motion.  Skin:    General: Skin is warm and dry.     Capillary Refill: Capillary refill takes less than 2 seconds.  Neurological:     Mental Status: She is alert and oriented to person, place, and time.  Psychiatric:        Behavior: Behavior normal.      Musculoskeletal Exam:  C-spine, thoracic spine, and lumbar spine good ROM.  No midline spinal tenderness.  No SI joint tenderness. Shoulder joints, elbow joints, wrist joints, MCPs, PIPs, and DIPs good ROM with no synovitis.  Complete fist formation bilaterally. Knee joints good ROM with no warmth or effusion.  Ankle joints good ROM with no tenderness or inflammation.  Pedal edema noted bilaterally.   CDAI Exam: CDAI Score: -- Patient Global: --; Provider Global: -- Swollen: --; Tender: -- Joint Exam 07/28/2020   No joint exam has been documented for this visit   There is currently no information documented on the homunculus. Go to the Rheumatology activity  and complete the homunculus joint exam.  Investigation: No additional findings.  Imaging: No results found.  Recent Labs: Lab Results  Component Value Date   WBC 5.5 07/14/2019   HGB 10.7 (L) 07/14/2019   PLT 198 07/14/2019   NA 139 07/14/2019   K 4.3 07/14/2019   CL 106 07/14/2019   CO2 26 07/14/2019   GLUCOSE 96 07/14/2019   BUN 16 07/14/2019   CREATININE 0.86 07/14/2019   BILITOT 0.5 07/14/2019   ALKPHOS 119 07/14/2019   AST 19 07/14/2019   ALT 20 07/14/2019   PROT 6.7 07/14/2019   ALBUMIN 3.5 07/14/2019   CALCIUM 9.1 07/14/2019   GFRAA >60 07/14/2019    Speciality Comments: PLQ eye exam: 10/07/2019 WNL Dr. Clent Jacks. Follow up in 1 year.  Procedures:  No procedures performed Allergies: Sulfa antibiotics   Assessment / Plan:     Visit  Diagnoses: Autoimmune disease ([place]) - +ANA: She has not had any signs or symptoms of a systemic autoimmune flare.  She is clinically doing well on Plaquenil 200 mg 1 tablet by mouth twice daily Monday through Friday.  She continues to tolerate Plaquenil and has not missed any doses recently.  She has not had any recent rashes, photosensitivity, or symptoms of Raynaud's.  She experiences occasional arthralgias and myalgias due to underlying muscular deconditioning.  No synovitis was noted on examination today.  She has not had any recent oral or nasal ulcerations.  She has intermittent eye dryness but has not had any mouth dryness recently.  She continues to have significant fatigue after undergoing chemotherapy.  She plans on starting to increase her exercise regimen as well as her caloric intake.  Complements were within normal limits and double-stranded DNA was negative on 10/31/2018.  Autoimmune lab work will be updated today.  She will continue taking Plaquenil 200 mg 1 tablet by mouth twice daily Monday through Friday.  A refill of Plaquenil was sent to the pharmacy today.  She was advised to notify us if she develops any new or worsening symptoms.  She will follow-up in the office in 6 months.- Plan: hydroxychloroquine (PLAQUENIL) 200 MG tablet, Urinalysis, Routine w reflex microscopic, Anti-DNA antibody, double-stranded, C3 and C4, Sedimentation rate  High risk medication use - Plaquenil 200 mg 1 tablet by mouth twice daily Monday through Friday. PLQ eye exam: 10/07/2019 WNL Dr. Clent Jacks. Follow up in 1 year.  She was advised to schedule an updated PLQ eye exam with Dr. Katy Fitch. CBC and CMP were updated on 07/20/20.   She has not had any recent infections.  She has received both covid-19 vaccinations.   Rheumatoid factor positive: She has no clinical features of rheumatoid arthritis.  No joint tenderness or synovitis noted on exam.   Primary osteoarthritis of both hands: She has PIP and DIP thickening  consistent with osteoarthritis of both hands.  She has no tenderness or inflammation on examination today.  She is able to make a complete fist bilaterally.  Wrist joints have good range of motion with no discomfort.  Primary osteoarthritis of both knees: Knee joints have good range of motion with no warmth or effusion.  She plans on starting to increase her exercise regimen to help with generalized muscular deconditioning.  DDD (degenerative disc disease), cervical: She has good range of motion of the C-spine on exam with no discomfort.  She is not experiencing any symptoms of radiculopathy at this time.  DDD (degenerative disc disease), lumbar: She experiences intermittent lower back  pain.  She has no symptoms of radiculopathy.  History of vitamin D deficiency  Primary insomnia: She has been sleeping well at night recently.  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Vater [facility]): She is followed closely by [facility] oncology.  She has ongoing fatigue, appetite loss, and generalized muscle weakness.  She plans on trying to increase her caloric intake by supplementing with protein powder.  She also would like to start increasing her exercise regimen for muscle strengthening.  Other medical conditions are listed as follows:   History of recurrent UTIs  MGUS (monoclonal gammopathy of unknown significance)  Essential hypertension  Tubulovillous adenoma of small intestine     Orders: Orders Placed This Encounter  Procedures  . Urinalysis, Routine w reflex microscopic  . Anti-DNA antibody, double-stranded  . C3 and C4  . Sedimentation rate   Meds ordered this encounter  Medications  . hydroxychloroquine (PLAQUENIL) 200 MG tablet    Sig: Take 1 tablet by mouth twice daily M-F    Dispense:  120 tablet    Refill:  0      Follow-Up Instructions: Return in 6 months (on 01/26/2021) for Autoimmune Disease.   Ofilia Neas, PA-C  Note - This record has been created using Dragon software.  Chart creation  errors have been sought, but may not always  have been located. Such creation errors do not reflect on  the standard of medical care.

## 2020-07-28 ENCOUNTER — Other Ambulatory Visit: Payer: Self-pay

## 2020-07-28 ENCOUNTER — Encounter: Payer: Self-pay | Admitting: Physician Assistant

## 2020-07-28 ENCOUNTER — Ambulatory Visit: Payer: Medicare PPO | Admitting: Physician Assistant

## 2020-07-28 VITALS — BP 128/84 | HR 74 | Ht 62.0 in | Wt 134.0 lb

## 2020-07-28 DIAGNOSIS — Z8639 Personal history of other endocrine, nutritional and metabolic disease: Secondary | ICD-10-CM

## 2020-07-28 DIAGNOSIS — F5101 Primary insomnia: Secondary | ICD-10-CM

## 2020-07-28 DIAGNOSIS — M359 Systemic involvement of connective tissue, unspecified: Secondary | ICD-10-CM | POA: Diagnosis not present

## 2020-07-28 DIAGNOSIS — R768 Other specified abnormal immunological findings in serum: Secondary | ICD-10-CM | POA: Diagnosis not present

## 2020-07-28 DIAGNOSIS — M19041 Primary osteoarthritis, right hand: Secondary | ICD-10-CM | POA: Diagnosis not present

## 2020-07-28 DIAGNOSIS — Z79899 Other long term (current) drug therapy: Secondary | ICD-10-CM | POA: Diagnosis not present

## 2020-07-28 DIAGNOSIS — M5136 Other intervertebral disc degeneration, lumbar region: Secondary | ICD-10-CM

## 2020-07-28 DIAGNOSIS — D1330 Benign neoplasm of unspecified part of small intestine: Secondary | ICD-10-CM

## 2020-07-28 DIAGNOSIS — Z87440 Personal history of urinary (tract) infections: Secondary | ICD-10-CM

## 2020-07-28 DIAGNOSIS — I10 Essential (primary) hypertension: Secondary | ICD-10-CM

## 2020-07-28 DIAGNOSIS — M5030 Other cervical disc degeneration, unspecified cervical region: Secondary | ICD-10-CM

## 2020-07-28 DIAGNOSIS — C241 Malignant neoplasm of ampulla of Vater: Secondary | ICD-10-CM

## 2020-07-28 DIAGNOSIS — M170 Bilateral primary osteoarthritis of knee: Secondary | ICD-10-CM

## 2020-07-28 DIAGNOSIS — D472 Monoclonal gammopathy: Secondary | ICD-10-CM

## 2020-07-28 DIAGNOSIS — M19042 Primary osteoarthritis, left hand: Secondary | ICD-10-CM

## 2020-07-28 MED ORDER — HYDROXYCHLOROQUINE SULFATE 200 MG PO TABS: ORAL_TABLET | ORAL | 0 refills | Status: DC

## 2020-07-31 ENCOUNTER — Other Ambulatory Visit: Payer: Self-pay

## 2020-07-31 DIAGNOSIS — Z79899 Other long term (current) drug therapy: Secondary | ICD-10-CM

## 2020-07-31 DIAGNOSIS — M359 Systemic involvement of connective tissue, unspecified: Secondary | ICD-10-CM

## 2020-08-01 LAB — CBC WITH DIFFERENTIAL/PLATELET
Absolute Monocytes: 367 cells/uL (ref 200–950)
Basophils Absolute: 38 cells/uL (ref 0–200)
Basophils Relative: 0.7 %
Eosinophils Absolute: 108 cells/uL (ref 15–500)
Eosinophils Relative: 2 %
HCT: 28.3 % — ABNORMAL LOW (ref 35.0–45.0)
Hemoglobin: 8.9 g/dL — ABNORMAL LOW (ref 11.7–15.5)
Lymphs Abs: 1436 cells/uL (ref 850–3900)
MCH: 27.4 pg (ref 27.0–33.0)
MCHC: 31.4 g/dL — ABNORMAL LOW (ref 32.0–36.0)
MCV: 87.1 fL (ref 80.0–100.0)
MPV: 11.1 fL (ref 7.5–12.5)
Monocytes Relative: 6.8 %
Neutro Abs: 3451 cells/uL (ref 1500–7800)
Neutrophils Relative %: 63.9 %
Platelets: 367 10*3/uL (ref 140–400)
RBC: 3.25 10*6/uL — ABNORMAL LOW (ref 3.80–5.10)
RDW: 15.6 % — ABNORMAL HIGH (ref 11.0–15.0)
Total Lymphocyte: 26.6 %
WBC: 5.4 10*3/uL (ref 3.8–10.8)

## 2020-08-01 LAB — URINALYSIS, ROUTINE W REFLEX MICROSCOPIC
Bilirubin Urine: NEGATIVE
Glucose, UA: NEGATIVE
Hgb urine dipstick: NEGATIVE
Hyaline Cast: NONE SEEN /LPF
Ketones, ur: NEGATIVE
Nitrite: NEGATIVE
Protein, ur: NEGATIVE
RBC / HPF: NONE SEEN /HPF (ref 0–2)
Specific Gravity, Urine: 1.007 (ref 1.001–1.03)
Squamous Epithelial / HPF: NONE SEEN /HPF (ref <=5)
pH: 5.5 (ref 5.0–8.0)

## 2020-08-01 LAB — COMPLETE METABOLIC PANEL WITH GFR
AG Ratio: 1.1 (calc) (ref 1.0–2.5)
ALT: 30 U/L — ABNORMAL HIGH (ref 6–29)
AST: 41 U/L — ABNORMAL HIGH (ref 10–35)
Albumin: 3.1 g/dL — ABNORMAL LOW (ref 3.6–5.1)
Alkaline phosphatase (APISO): 153 U/L (ref 37–153)
BUN: 10 mg/dL (ref 7–25)
CO2: 23 mmol/L (ref 20–32)
Calcium: 8.5 mg/dL — ABNORMAL LOW (ref 8.6–10.4)
Chloride: 106 mmol/L (ref 98–110)
Creat: 0.87 mg/dL (ref 0.60–0.93)
GFR, Est African American: 76 mL/min/{1.73_m2} (ref >=60)
GFR, Est Non African American: 66 mL/min/{1.73_m2} (ref >=60)
Globulin: 2.9 g/dL (calc) (ref 1.9–3.7)
Glucose, Bld: 99 mg/dL (ref 65–99)
Potassium: 3.4 mmol/L — ABNORMAL LOW (ref 3.5–5.3)
Sodium: 139 mmol/L (ref 135–146)
Total Bilirubin: 0.5 mg/dL (ref 0.2–1.2)
Total Protein: 6 g/dL — ABNORMAL LOW (ref 6.1–8.1)

## 2020-08-01 LAB — ANTI-DNA ANTIBODY, DOUBLE-STRANDED: ds DNA Ab: <1 IU/mL

## 2020-08-01 LAB — SEDIMENTATION RATE: Sed Rate: 38 mm/h — ABNORMAL HIGH (ref 0–30)

## 2020-08-01 LAB — C3 AND C4
C3 Complement: 111 mg/dL (ref 83–193)
C4 Complement: 27 mg/dL (ref 15–57)

## 2020-08-01 NOTE — Progress Notes (Signed)
UA revealed 2+ leukocytes and moderate bacteria. Negative nitrites.  If she develops symptoms of a UTI she should follow up with PCP urgently for further evaluation and management.  No proteinuria noted.  Complements WNL.  DsDNA is negative. ESR remains elevated but stable.  Likely due to history of chronic anemia and history of cancer. She is followed closely by her oncologist.   Hemoglobin has dropped to 8.9.  Please notify the patient and forward lab work to her oncologist.   LFTs are mildly elevated but stable. Total protein borderline low likely secondary to chronic disease. Potassium is borderline low-3.4.    She will continue to have frequent lab monitoring by her oncologist.     She can continue taking the current dose of Plaquenil as prescribed.

## 2020-10-08 ENCOUNTER — Other Ambulatory Visit: Payer: Self-pay | Admitting: Physician Assistant

## 2020-10-08 DIAGNOSIS — M359 Systemic involvement of connective tissue, unspecified: Secondary | ICD-10-CM

## 2020-10-09 NOTE — Telephone Encounter (Signed)
Please advise the patient to schedule updated PLQ eye exam.

## 2020-10-09 NOTE — Telephone Encounter (Signed)
Left message to advise patient she is due to update her PLQ eye exam.

## 2020-10-09 NOTE — Telephone Encounter (Signed)
Last Visit: 07/28/2020 Next Visit: 01/26/2021 Labs: 07/31/2020 Hemoglobin has dropped to 8.9.  Eye exam: 10/07/2019 WNL LFTs are mildly elevated but stable. Total protein borderline Potassium is borderline low-3.4.   Current Dose per office note 07/28/2020: Plaquenil 200 mg 1 tablet by mouth twice daily Monday through Friday.  UW:[id number] disease   Okay to refill Plaquenil?

## 2020-12-11 ENCOUNTER — Other Ambulatory Visit: Payer: Self-pay | Admitting: Internal Medicine

## 2020-12-11 DIAGNOSIS — Z1231 Encounter for screening mammogram for malignant neoplasm of breast: Secondary | ICD-10-CM

## 2021-01-08 ENCOUNTER — Other Ambulatory Visit: Payer: Self-pay | Admitting: *Deleted

## 2021-01-08 DIAGNOSIS — M359 Systemic involvement of connective tissue, unspecified: Secondary | ICD-10-CM

## 2021-01-08 DIAGNOSIS — R768 Other specified abnormal immunological findings in serum: Secondary | ICD-10-CM

## 2021-01-08 DIAGNOSIS — Z79899 Other long term (current) drug therapy: Secondary | ICD-10-CM

## 2021-01-09 LAB — COMPLETE METABOLIC PANEL WITH GFR
AG Ratio: 1.1 (calc) (ref 1.0–2.5)
ALT: 221 U/L — ABNORMAL HIGH (ref 6–29)
AST: 344 U/L — ABNORMAL HIGH (ref 10–35)
Albumin: 3.4 g/dL — ABNORMAL LOW (ref 3.6–5.1)
Alkaline phosphatase (APISO): 963 U/L — ABNORMAL HIGH (ref 37–153)
BUN: 11 mg/dL (ref 7–25)
CO2: 26 mmol/L (ref 20–32)
Calcium: 8.6 mg/dL (ref 8.6–10.4)
Chloride: 107 mmol/L (ref 98–110)
Creat: 0.61 mg/dL (ref 0.60–0.93)
GFR, Est African American: 104 mL/min/{1.73_m2} (ref >=60)
GFR, Est Non African American: 89 mL/min/{1.73_m2} (ref >=60)
Globulin: 3 g/dL (calc) (ref 1.9–3.7)
Glucose, Bld: 95 mg/dL (ref 65–139)
Potassium: 3.6 mmol/L (ref 3.5–5.3)
Sodium: 142 mmol/L (ref 135–146)
Total Bilirubin: 0.9 mg/dL (ref 0.2–1.2)
Total Protein: 6.4 g/dL (ref 6.1–8.1)

## 2021-01-09 LAB — CBC WITH DIFFERENTIAL/PLATELET
Absolute Monocytes: 308 cells/uL (ref 200–950)
Basophils Absolute: 20 cells/uL (ref 0–200)
Basophils Relative: 0.5 %
Eosinophils Absolute: 260 cells/uL (ref 15–500)
Eosinophils Relative: 6.5 %
HCT: 33.2 % — ABNORMAL LOW (ref 35.0–45.0)
Hemoglobin: 10.2 g/dL — ABNORMAL LOW (ref 11.7–15.5)
Lymphs Abs: 1004 cells/uL (ref 850–3900)
MCH: 26.2 pg — ABNORMAL LOW (ref 27.0–33.0)
MCHC: 30.7 g/dL — ABNORMAL LOW (ref 32.0–36.0)
MCV: 85.3 fL (ref 80.0–100.0)
MPV: 11.9 fL (ref 7.5–12.5)
Monocytes Relative: 7.7 %
Neutro Abs: 2408 cells/uL (ref 1500–7800)
Neutrophils Relative %: 60.2 %
Platelets: 225 10*3/uL (ref 140–400)
RBC: 3.89 10*6/uL (ref 3.80–5.10)
RDW: 14.1 % (ref 11.0–15.0)
Total Lymphocyte: 25.1 %
WBC: 4 10*3/uL (ref 3.8–10.8)

## 2021-01-09 NOTE — Progress Notes (Signed)
Dr. Alvy Bimler, he had routine labs on patient.  Her alk phos and AST and ALT unremarkably elevated.  I understand you are following her for the cancer of ampulla of vater. I will notify patient about her labs.  Please contact the patient if needed.

## 2021-01-10 ENCOUNTER — Telehealth: Payer: Self-pay

## 2021-01-10 ENCOUNTER — Other Ambulatory Visit: Payer: Self-pay | Admitting: Physician Assistant

## 2021-01-10 DIAGNOSIS — M359 Systemic involvement of connective tissue, unspecified: Secondary | ICD-10-CM

## 2021-01-10 NOTE — Telephone Encounter (Signed)
I called patient 

## 2021-01-10 NOTE — Telephone Encounter (Signed)
Last Visit: 07/28/2020 Next Visit: 02/08/2021 Labs: 01/08/2021,  Eye exam: 10/10/2020  Current Dose per office note 07/28/2020, Plaquenil 200 mg 1 tablet by mouth twice daily Monday through Friday DX: Autoimmune disease   Last Fill: 10/09/2020  Okay to refill Plaquenil?

## 2021-01-10 NOTE — Telephone Encounter (Signed)
Patient called stating she had additional questions regarding her labwork and requested a return call.

## 2021-01-10 NOTE — Telephone Encounter (Signed)
LMOM, returning patient's call

## 2021-01-15 ENCOUNTER — Other Ambulatory Visit: Payer: Self-pay | Admitting: Physician Assistant

## 2021-01-15 DIAGNOSIS — M359 Systemic involvement of connective tissue, unspecified: Secondary | ICD-10-CM

## 2021-01-26 ENCOUNTER — Ambulatory Visit: Payer: Medicare PPO | Admitting: Rheumatology

## 2021-01-26 NOTE — Progress Notes (Signed)
Office Visit Note  Patient: Kimberly Vazquez             Date of Birth: September 26, 1946           MRN: [id number]             PCP: Willey Blade, MD Referring: Willey Blade, MD Visit Date: 02/08/2021 Occupation: @GUAROCC @  Subjective:  Generalized weakness   History of Present Illness: Kimberly Vazquez is a 75 y.o. female with history of autoimmune disease, osteoarthritis, and DDD.  She is taking plaquenil 200 mg 1 tablet by mouth twice daily Monday through Friday.  She continues to tolerate plaquenil without any side effects.  She denies any signs or symptoms of an autoimmune disease flare.  She states she continues to follow up at [facility] oncology for management of cancer of the ampulla of vater.  She discontinued chemotherapy about 2 months ago.  She continues to notice gradual weight loss and generalized weakness.  She has noticed appetite loss and has tried drinking boost and ensure but notices increased gas and bloating with these supplemental shakes.  She states she will be starting home therapy soon to try to build up some of her strength.  She continues to have significant fatigue but has been sleeping well at night. She denies any increased joint pain or joint swelling at this time.  She denies any recent rashes or symptoms of raynaud's.  She has not had any SOB, pleuritic chest pain, or palpations.  She denies any oral or nasal ulcers but she continues to have chronic sicca symptoms.      Activities of Daily Living:  Patient reports morning stiffness for none.   Patient Reports nocturnal pain.  Difficulty dressing/grooming: Denies Difficulty climbing stairs: Denies Difficulty getting out of chair: Denies Difficulty using hands for taps, buttons, cutlery, and/or writing: Denies  Review of Systems  Constitutional: Positive for fatigue.  HENT: Positive for mouth dryness. Negative for mouth sores and nose dryness.   Eyes: Positive for dryness. Negative for pain and visual  disturbance.  Respiratory: Negative for cough, hemoptysis, shortness of breath and difficulty breathing.   Cardiovascular: Positive for swelling in legs/feet. Negative for chest pain, palpitations and hypertension.  Gastrointestinal: Positive for diarrhea. Negative for blood in stool and constipation.  Endocrine: Positive for cold intolerance. Negative for increased urination.  Genitourinary: Negative for difficulty urinating and painful urination.  Musculoskeletal: Positive for arthralgias, joint pain, joint swelling, muscle weakness and morning stiffness. Negative for myalgias, muscle tenderness and myalgias.  Skin: Negative for color change, pallor, rash, hair loss, nodules/bumps, skin tightness, ulcers and sensitivity to sunlight.  Allergic/Immunologic: Positive for susceptible to infections.  Neurological: Negative for dizziness and headaches.  Hematological: Negative for bruising/bleeding tendency and swollen glands.  Psychiatric/Behavioral: Positive for sleep disturbance. Negative for depressed mood. The patient is not nervous/anxious.     PMFS History:  Patient Active Problem List   Diagnosis Date Noted  . 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 Anemia, chronic disease 07/22/2019  . Protein-calorie malnutrition, severe 11/11/2018  . Non-intractable vomiting   . Acute renal failure (ARF) ([place]) 11/08/2018  . Essential hypertension 11/08/2018  . Tubulovillous adenoma of small intestine   . AKI (acute kidney injury) ([place])   . Post-ERCP acute pancreatitis 11/01/2018  . Biliary obstruction 10/31/2018  . Obstructive jaundice   . Biliary tract imaging abnormality   . Dehydration   . Dizziness 07/14/2018  . Hemorrhoids 07/14/2018  . Noncompliance with medication regimen 03/07/2017  .  Rheumatoid factor positive 03/07/2017  . Primary insomnia 03/07/2017  . Primary osteoarthritis of both hands 03/07/2017  . Primary osteoarthritis of both knees 03/07/2017  . History of  recurrent UTIs 03/07/2017  . Vitamin D deficiency 03/07/2017  . High risk medication use 08/29/2016  . Preventive measure 07/07/2015  . Autoimmune disease ([place]) 06/20/2014  . MGUS (monoclonal gammopathy of unknown significance) 06/20/2014    Past Medical History:  Diagnosis Date  . Allergic rhinitis   . Anemia   . Arthritis   . Bladder disorder    "lazy" , caths twice a day  . Cancer ([place])   . Depression   . Fatty liver   . GERD (gastroesophageal reflux disease)   . Hypertension   . Seasonal allergies   . Systemic lupus erythematosus ([place])   . UTI (urinary tract infection)   . Vitamin D deficiency     Family History  Problem Relation Age of Onset  . Cancer Brother        unknown  . Kidney disease Son   . Diabetes Brother   . Heart disease Brother   . Colon cancer Neg Hx    Past Surgical History:  Procedure Laterality Date  . ABDOMINAL HYSTERECTOMY    . BIOPSY  10/31/2018   Procedure: BIOPSY;  Surgeon: Gatha Mayer, MD;  Location: [place];  Service: Endoscopy;;  . ENDOSCOPIC RETROGRADE CHOLANGIOPANCREATOGRAPHY (ERCP) WITH PROPOFOL N/A 10/31/2018   Procedure: ENDOSCOPIC RETROGRADE CHOLANGIOPANCREATOGRAPHY (ERCP) WITH PROPOFOL;  Surgeon: Gatha Mayer, MD;  Location[facility] ENDOSCOPY;  Service: Endoscopy;  Laterality: N/A;  . IR INT EXT BILIARY DRAIN WITH CHOLANGIOGRAM  11/03/2018  . IR RADIOLOGIST EVAL & MGMT  12/22/2018  . ROTATOR CUFF REPAIR Right 2012  . TONSILLECTOMY  1965  . WHIPPLE PROCEDURE     Social History   Social History Narrative  . Not on file   Immunization History  Administered Date(s) Administered  . Fluad Quad(high Dose 65+) 07/22/2019, 07/21/2020  . Influenza,inj,Quad PF,6+ Mos 07/07/2015, 07/05/2016, 07/14/2017  . PFIZER(Purple Top)SARS-COV-2 Vaccination 11/22/2019, 12/17/2019  . Zoster Recombinat (Shingrix) 01/10/2018, 04/14/2018     Objective: Vital Signs: BP 138/89 (BP Location: Left Arm, Patient Position: Sitting, Cuff Size: Normal)    Pulse 91   Resp 16   Ht 5' 1.5" (1.562 m)   Wt 117 lb (53.1 kg)   BMI 21.75 kg/m    Physical Exam Vitals and nursing note reviewed.  Constitutional:      Appearance: She is well-developed.  HENT:     Head: Normocephalic and atraumatic.  Eyes:     Conjunctiva/sclera: Conjunctivae normal.  Pulmonary:     Effort: Pulmonary effort is normal.  Abdominal:     Palpations: Abdomen is soft.  Musculoskeletal:     Cervical back: Normal range of motion.  Skin:    General: Skin is warm and dry.     Capillary Refill: Capillary refill takes less than 2 seconds.  Neurological:     Mental Status: She is alert and oriented to person, place, and time.  Psychiatric:        Behavior: Behavior normal.      Musculoskeletal Exam: C-spine limited ROM with lateral rotation.  Postural thoracic kyphosis. No midline spinal tenderness or SI joint tenderness.  Shoulder joints, elbow joints, wrist joints, MCPs, PIPs, and DIPs good ROM with no discomfort.  No tenderness or synovitis over MCP joints. PIP and DIP thickening consistent with ostearthritis of both hands.  Hip joints good ROM with no  discomfort. Tenderness over bilateral trochanteric bursa.  Knee joints good ROM with no warmth or effusion.  Ankle joints good ROM with no tenderness or swelling.  No tenderness over MTP joints.   CDAI Exam: CDAI Score: -- Patient Global: --; Provider Global: -- Swollen: --; Tender: -- Joint Exam 02/08/2021   No joint exam has been documented for this visit   There is currently no information documented on the homunculus. Go to the Rheumatology activity and complete the homunculus joint exam.  Investigation: No additional findings.  Imaging: MM 3D SCREEN BREAST BILATERAL  Result Date: 02/05/2021 CLINICAL DATA:  Screening. EXAM: DIGITAL SCREENING BILATERAL MAMMOGRAM WITH TOMOSYNTHESIS AND CAD TECHNIQUE: Bilateral screening digital craniocaudal and mediolateral oblique mammograms were obtained. Bilateral  screening digital breast tomosynthesis was performed. The images were evaluated with computer-aided detection. COMPARISON:  Previous exam(s). ACR Breast Density Category c: The breast tissue is heterogeneously dense, which may obscure small masses. FINDINGS: There are no findings suspicious for malignancy. The images were evaluated with computer-aided detection. IMPRESSION: No mammographic evidence of malignancy. A result letter of this screening mammogram will be mailed directly to the patient. RECOMMENDATION: Screening mammogram in one year. (Code:SM-B-01Y) BI-RADS CATEGORY  1: Negative. Electronically Signed   By: Ammie Ferrier M.D.   On: 02/05/2021 14:00    Recent Labs: Lab Results  Component Value Date   WBC 4.0 01/08/2021   HGB 10.2 (L) 01/08/2021   PLT 225 01/08/2021   NA 142 01/08/2021   K 3.6 01/08/2021   CL 107 01/08/2021   CO2 26 01/08/2021   GLUCOSE 95 01/08/2021   BUN 11 01/08/2021   CREATININE 0.61 01/08/2021   BILITOT 0.9 01/08/2021   ALKPHOS 119 07/14/2019   AST 344 (H) 01/08/2021   ALT 221 (H) 01/08/2021   PROT 6.4 01/08/2021   ALBUMIN 3.5 07/14/2019   CALCIUM 8.6 01/08/2021   GFRAA 104 01/08/2021    Speciality Comments: PLQ eye exam: 10/10/2020 WNL Dr. Clent Jacks. Follow up in 1 year.  Procedures:  No procedures performed Allergies: Sulfa antibiotics       Assessment / Plan:     Visit Diagnoses: Autoimmune disease ([place]) - +ANA:  She has not had any signs or symptoms of an autoimmune disease flare.  She is clinically doing well taking plaquenil 200 mg 1 tablet by mouth twice daily Monday through Friday.  She continues to follow-up with [facility] oncology for management of cancer of the ampulla of Vater.  She completed chemotherapy 2 months ago but continues to have significant fatigue, generalized weakness, loss of appetite, and gradual weight loss.  Most of the symptoms she has been experiencing are secondary to her underlying cancer not due to her autoimmune  disease.  C3 and C4 WNL and dsDNA was negative on 01/11/21 which were reviewed with the patient today in the office.  She has not had any recent rashes, symptoms of Raynaud's, oral or nasal ulcerations, pleuritic chest pain, palpitations, shortness of breath, or joint swelling.  She had no synovitis on examination today.  She continues to have chronic sicca symptoms which are likely medication side effect.  She will remain on plaquenil as prescribed.  She does not need a refill at this time.  We discussed several things to try to improve her appetite and promote weight gain.  She will be starting home therapy soon to help to try to improve her generalized muscular deconditioning.  Advised patient to notify us if she develops signs or symptoms of a flare.  She will follow up in 6 months.   High risk medication use - Plaquenil 200 mg 1 tablet by mouth twice daily Monday through Friday. PLQ eye exam: 10/10/2020.  CBC and CMP updated on 01/21/21.  She will continue to require updated lab work every 5 months.   Rheumatoid factor positive: She has no clinical features of rheumatoid arthritis.  No synovitis was noted on exam.    Primary osteoarthritis of both hands: She has PIP and DIP thickening consistent with osteoarthritis of both hands.  No tenderness or inflammation was noted.  She was able to make a complete fist bilaterally.  We discussed the importance of joint protection and muscle strengthening.  Primary osteoarthritis of both knees: She has good ROM of both knee joints with no warmth or effusion.  She experiences intermittent discomfort in her knee joints.   DDD (degenerative disc disease), cervical: She has limited ROM with lateral rotation bilaterally. She was previously seeing a Restaurant manager, fast food.  She tries to perform neck exercises on a regular basis.   DDD (degenerative disc disease), lumbar: She has intermittent discomfort in her lower back.  She has difficulty standing or walking prolonged distances.    Trochanteric bursitis of both hips: She has tenderness palpation over bilateral trochanteric bursa on examination today.  She was given a handout of exercises to perform.    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Vater [facility]) - She is followed closely by [facility] oncology.  She completed chemotherapy 2 months ago.  She continues to experience significant fatigue, loss of appetite, weight loss, and generalized weakness.  We discussed several nutritional supplements as well as peanut butter protein powder to increase her appetite and promote weight gain.  She will be starting home therapy soon to try to improve her strength and energy level.   Other medical conditions are listed as follows:   History of vitamin D deficiency  Primary insomnia: She is taking nortriptyline 50 mg at bedtime.  She has been sleeping well at night.   MGUS (monoclonal gammopathy of unknown significance)  History of recurrent UTIs  Tubulovillous adenoma of small intestine  Essential hypertension: BP was 138/89.    Orders: No orders of the defined types were placed in this encounter.  No orders of the defined types were placed in this encounter.    Follow-Up Instructions: Return in about 6 months (around 08/10/2021) for Autoimmune Disease, Osteoarthritis, DDD.   Ofilia Neas, PA-C  Note - This record has been created using Dragon software.  Chart creation errors have been sought, but may not always  have been located. Such creation errors do not reflect on  the standard of medical care.

## 2021-01-28 ENCOUNTER — Other Ambulatory Visit: Payer: Self-pay | Admitting: Physician Assistant

## 2021-01-28 DIAGNOSIS — M359 Systemic involvement of connective tissue, unspecified: Secondary | ICD-10-CM

## 2021-02-01 ENCOUNTER — Ambulatory Visit: Payer: Medicare PPO

## 2021-02-03 ENCOUNTER — Other Ambulatory Visit: Payer: Self-pay

## 2021-02-03 ENCOUNTER — Ambulatory Visit
Admission: RE | Admit: 2021-02-03 | Discharge: 2021-02-03 | Disposition: A | Discharge status: Home / Self Care | Payer: Medicare PPO | Source: Ambulatory Visit | Attending: Internal Medicine | Admitting: Internal Medicine

## 2021-02-03 DIAGNOSIS — Z1231 Encounter for screening mammogram for malignant neoplasm of breast: Secondary | ICD-10-CM

## 2021-02-08 ENCOUNTER — Ambulatory Visit (INDEPENDENT_AMBULATORY_CARE_PROVIDER_SITE_OTHER): Payer: Medicare PPO | Admitting: Physician Assistant

## 2021-02-08 ENCOUNTER — Other Ambulatory Visit: Payer: Self-pay

## 2021-02-08 ENCOUNTER — Encounter: Payer: Self-pay | Admitting: Physician Assistant

## 2021-02-08 VITALS — BP 138/89 | HR 91 | Resp 16 | Ht 61.5 in | Wt 117.0 lb

## 2021-02-08 DIAGNOSIS — R768 Other specified abnormal immunological findings in serum: Secondary | ICD-10-CM | POA: Diagnosis not present

## 2021-02-08 DIAGNOSIS — Z87440 Personal history of urinary (tract) infections: Secondary | ICD-10-CM

## 2021-02-08 DIAGNOSIS — I10 Essential (primary) hypertension: Secondary | ICD-10-CM

## 2021-02-08 DIAGNOSIS — C241 Malignant neoplasm of ampulla of Vater: Secondary | ICD-10-CM

## 2021-02-08 DIAGNOSIS — D472 Monoclonal gammopathy: Secondary | ICD-10-CM

## 2021-02-08 DIAGNOSIS — Z79899 Other long term (current) drug therapy: Secondary | ICD-10-CM | POA: Diagnosis not present

## 2021-02-08 DIAGNOSIS — F5101 Primary insomnia: Secondary | ICD-10-CM

## 2021-02-08 DIAGNOSIS — Z8639 Personal history of other endocrine, nutritional and metabolic disease: Secondary | ICD-10-CM

## 2021-02-08 DIAGNOSIS — M5136 Other intervertebral disc degeneration, lumbar region: Secondary | ICD-10-CM

## 2021-02-08 DIAGNOSIS — M19042 Primary osteoarthritis, left hand: Secondary | ICD-10-CM

## 2021-02-08 DIAGNOSIS — M7061 Trochanteric bursitis, right hip: Secondary | ICD-10-CM

## 2021-02-08 DIAGNOSIS — M7062 Trochanteric bursitis, left hip: Secondary | ICD-10-CM

## 2021-02-08 DIAGNOSIS — M19041 Primary osteoarthritis, right hand: Secondary | ICD-10-CM | POA: Diagnosis not present

## 2021-02-08 DIAGNOSIS — D1330 Benign neoplasm of unspecified part of small intestine: Secondary | ICD-10-CM

## 2021-02-08 DIAGNOSIS — M170 Bilateral primary osteoarthritis of knee: Secondary | ICD-10-CM

## 2021-02-08 DIAGNOSIS — M359 Systemic involvement of connective tissue, unspecified: Secondary | ICD-10-CM

## 2021-02-08 DIAGNOSIS — M5030 Other cervical disc degeneration, unspecified cervical region: Secondary | ICD-10-CM

## 2021-02-08 NOTE — Patient Instructions (Signed)
Hip Bursitis Rehab Ask your health care provider which exercises are safe for you. Do exercises exactly as told by your health care provider and adjust them as directed. It is normal to feel mild stretching, pulling, tightness, or discomfort as you do these exercises. Stop right away if you feel sudden pain or your pain gets worse. Do not begin these exercises until told by your health care provider. Stretching exercise This exercise warms up your muscles and joints and improves the movement and flexibility of your hip. This exercise also helps to relieve pain and stiffness. Iliotibial band stretch An iliotibial band is a strong band of muscle tissue that runs from the outer side of your hip to the outer side of your thigh and knee. 1. Lie on your side with your left / right leg in the top position. 2. Bend your left / right knee and grab your ankle. Stretch out your bottom arm to help you balance. 3. Slowly bring your knee back so your thigh is behind your body. 4. Slowly lower your knee toward the floor until you feel a gentle stretch on the outside of your left / right thigh. If you do not feel a stretch and your knee will not fall farther, place the heel of your other foot on top of your knee and pull your knee down toward the floor with your foot. 5. Hold this position for __________ seconds. 6. Slowly return to the starting position. Repeat __________ times. Complete this exercise __________ times a day.   Strengthening exercises These exercises build strength and endurance in your hip and pelvis. Endurance is the ability to use your muscles for a long time, even after they get tired. Bridge This exercise strengthens the muscles that move your thigh backward (hip extensors). 1. Lie on your back on a firm surface with your knees bent and your feet flat on the floor. 2. Tighten your buttocks muscles and lift your buttocks off the floor until your trunk is level with your thighs. ? Do not arch  your back. ? You should feel the muscles working in your buttocks and the back of your thighs. If you do not feel these muscles, slide your feet 1-2 inches (2.5-5 cm) farther away from your buttocks. ? If this exercise is too easy, try doing it with your arms crossed over your chest. 3. Hold this position for __________ seconds. 4. Slowly lower your hips to the starting position. 5. Let your muscles relax completely after each repetition. Repeat __________ times. Complete this exercise __________ times a day.   Squats This exercise strengthens the muscles in front of your thigh and knee (quadriceps). 1. Stand in front of a table, with your feet and knees pointing straight ahead. You may rest your hands on the table for balance but not for support. 2. Slowly bend your knees and lower your hips like you are going to sit in a chair. ? Keep your weight over your heels, not over your toes. ? Keep your lower legs upright so they are parallel with the table legs. ? Do not let your hips go lower than your knees. ? Do not bend lower than told by your health care provider. ? If your hip pain increases, do not bend as low. 3. Hold the squat position for __________ seconds. 4. Slowly push with your legs to return to standing. Do not use your hands to pull yourself to standing. Repeat __________ times. Complete this exercise __________ times a day.  Hip hike 1. Stand sideways on a bottom step. Stand on your left / right leg with your other foot unsupported next to the step. You can hold on to the railing or wall for balance if needed. 2. Keep your knees straight and your torso square. Then lift your left / right hip up toward the ceiling. 3. Hold this position for __________ seconds. 4. Slowly let your left / right hip lower toward the floor, past the starting position. Your foot should get closer to the floor. Do not lean or bend your knees. Repeat __________ times. Complete this exercise __________ times  a day. Single leg stand 1. Without shoes, stand near a railing or in a doorway. You may hold on to the railing or door frame as needed for balance. 2. Squeeze your left / right buttock muscles, then lift up your other foot. ? Do not let your left / right hip push out to the side. ? It is helpful to stand in front of a mirror for this exercise so you can watch your hip. 3. Hold this position for __________ seconds. Repeat __________ times. Complete this exercise __________ times a day. This information is not intended to replace advice given to you by your health care provider. Make sure you discuss any questions you have with your health care provider. Document Revised: 02/01/2019 Document Reviewed: 02/01/2019 Elsevier Patient Education  [place].

## 2021-04-20 DEATH — deceased

## 2021-08-02 NOTE — Progress Notes (Deleted)
Office Visit Note  Patient: Kimberly Vazquez             Date of Birth: 01-30-1946           MRN: [id number]             PCP: Willey Blade, MD Referring: Willey Blade, MD Visit Date: 08/16/2021 Occupation: @GUAROCC @  Subjective:  No chief complaint on file.   History of Present Illness: Kimberly Vazquez is a 75 y.o. female ***   Activities of Daily Living:  Patient reports morning stiffness for *** {minute/hour:19697}.   Patient {ACTIONS;DENIES/REPORTS:21021675::"Denies"} nocturnal pain.  Difficulty dressing/grooming: {ACTIONS;DENIES/REPORTS:21021675::"Denies"} Difficulty climbing stairs: {ACTIONS;DENIES/REPORTS:21021675::"Denies"} Difficulty getting out of chair: {ACTIONS;DENIES/REPORTS:21021675::"Denies"} Difficulty using hands for taps, buttons, cutlery, and/or writing: {ACTIONS;DENIES/REPORTS:21021675::"Denies"}  No Rheumatology ROS completed.   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[place]) 07/22/2019   Anemia, chronic disease 07/22/2019   Protein-calorie malnutrition, severe 11/11/2018   Non-intractable vomiting    Acute renal failure (ARF) ([place]) 11/08/2018   Essential hypertension 11/08/2018   Tubulovillous adenoma of small intestine    AKI (acute kidney injury) ([place])    Post-ERCP acute pancreatitis 11/01/2018   Biliary obstruction 10/31/2018   Obstructive jaundice    Biliary tract imaging abnormality    Dehydration    Dizziness 07/14/2018   Hemorrhoids 07/14/2018   Noncompliance with medication regimen 03/07/2017   Rheumatoid factor positive 03/07/2017   Primary insomnia 03/07/2017   Primary osteoarthritis of both hands 03/07/2017   Primary osteoarthritis of both knees 03/07/2017   History of recurrent UTIs 03/07/2017   Vitamin D deficiency 03/07/2017   High risk medication use 08/29/2016   Preventive measure 07/07/2015   Autoimmune disease ([place]) 06/20/2014   MGUS (monoclonal gammopathy of  unknown significance) 06/20/2014    Past Medical History:  Diagnosis Date   Allergic rhinitis    Anemia    Arthritis    Bladder disorder    "lazy" , caths twice a day   Cancer ([place])    Depression    Fatty liver    GERD (gastroesophageal reflux disease)    Hypertension    Seasonal allergies    Systemic lupus erythematosus ([place])    UTI (urinary tract infection)    Vitamin D deficiency     Family History  Problem Relation Age of Onset   Cancer Brother        unknown   Kidney disease Son    Diabetes Brother    Heart disease Brother    Colon cancer Neg Hx    Past Surgical History:  Procedure Laterality Date   ABDOMINAL HYSTERECTOMY     BIOPSY  10/31/2018   Procedure: BIOPSY;  Surgeon: Gatha Mayer, MD;  Location: [facility] ENDOSCOPY;  Service: Endoscopy;;   ENDOSCOPIC RETROGRADE CHOLANGIOPANCREATOGRAPHY (ERCP) WITH PROPOFOL N/A 10/31/2018   Procedure: ENDOSCOPIC RETROGRADE CHOLANGIOPANCREATOGRAPHY (ERCP) WITH PROPOFOL;  Surgeon: Gatha Mayer, MD;  Location: [place];  Service: Endoscopy;  Laterality: N/A;   IR INT EXT BILIARY DRAIN WITH CHOLANGIOGRAM  11/03/2018   IR RADIOLOGIST EVAL & MGMT  12/22/2018   ROTATOR CUFF REPAIR Right 2012   TONSILLECTOMY  1965   WHIPPLE PROCEDURE     Social History   Social History Narrative   Not on file   Immunization History  Administered Date(s) Administered   Fluad Quad(high Dose 65+) 07/22/2019, 07/21/2020   Influenza,inj,Quad PF,6+ Mos 07/07/2015, 07/05/2016, 07/14/2017   PFIZER(Purple Top)SARS-COV-2 Vaccination 11/22/2019, 12/17/2019  Zoster Recombinat (Shingrix) 01/10/2018, 04/14/2018     Objective: Vital Signs: There were no vitals taken for this visit.   Physical Exam   Musculoskeletal Exam: ***  CDAI Exam: CDAI Score: -- Patient Global: --; Provider Global: -- Swollen: --; Tender: -- Joint Exam 08/16/2021   No joint exam has been documented for this visit   There is currently no information documented on the  homunculus. Go to the Rheumatology activity and complete the homunculus joint exam.  Investigation: No additional findings.  Imaging: No results found.  Recent Labs: Lab Results  Component Value Date   WBC 4.0 01/08/2021   HGB 10.2 (L) 01/08/2021   PLT 225 01/08/2021   NA 142 01/08/2021   K 3.6 01/08/2021   CL 107 01/08/2021   CO2 26 01/08/2021   GLUCOSE 95 01/08/2021   BUN 11 01/08/2021   CREATININE 0.61 01/08/2021   BILITOT 0.9 01/08/2021   ALKPHOS 119 07/14/2019   AST 344 (H) 01/08/2021   ALT 221 (H) 01/08/2021   PROT 6.4 01/08/2021   ALBUMIN 3.5 07/14/2019   CALCIUM 8.6 01/08/2021   GFRAA 104 01/08/2021    Speciality Comments: PLQ eye exam: 10/10/2020 WNL Dr. Clent Jacks. Follow up in 1 year.  Procedures:  No procedures performed Allergies: Sulfa antibiotics   Assessment / Plan:     Visit Diagnoses: No diagnosis found.  Orders: No orders of the defined types were placed in this encounter.  No orders of the defined types were placed in this encounter.   Face-to-face time spent with patient was *** minutes. Greater than 50% of time was spent in counseling and coordination of care.  Follow-Up Instructions: No follow-ups on file.   Earnestine Mealing, CMA  Note - This record has been created using Editor, commissioning.  Chart creation errors have been sought, but may not always  have been located. Such creation errors do not reflect on  the standard of medical care.

## 2021-08-13 ENCOUNTER — Telehealth: Payer: Self-pay | Admitting: Rheumatology

## 2021-08-13 NOTE — Telephone Encounter (Signed)
Patient's spouse called to cancel patient's upcoming appointment. Patient passed away.

## 2021-08-16 ENCOUNTER — Ambulatory Visit: Payer: Medicare PPO | Admitting: Rheumatology

## 2021-08-16 DIAGNOSIS — Z87440 Personal history of urinary (tract) infections: Secondary | ICD-10-CM

## 2021-08-16 DIAGNOSIS — C241 Malignant neoplasm of ampulla of Vater: Secondary | ICD-10-CM

## 2021-08-16 DIAGNOSIS — M5030 Other cervical disc degeneration, unspecified cervical region: Secondary | ICD-10-CM

## 2021-08-16 DIAGNOSIS — M359 Systemic involvement of connective tissue, unspecified: Secondary | ICD-10-CM

## 2021-08-16 DIAGNOSIS — Z8639 Personal history of other endocrine, nutritional and metabolic disease: Secondary | ICD-10-CM

## 2021-08-16 DIAGNOSIS — M170 Bilateral primary osteoarthritis of knee: Secondary | ICD-10-CM

## 2021-08-16 DIAGNOSIS — F5101 Primary insomnia: Secondary | ICD-10-CM

## 2021-08-16 DIAGNOSIS — R768 Other specified abnormal immunological findings in serum: Secondary | ICD-10-CM

## 2021-08-16 DIAGNOSIS — M5136 Other intervertebral disc degeneration, lumbar region: Secondary | ICD-10-CM

## 2021-08-16 DIAGNOSIS — I10 Essential (primary) hypertension: Secondary | ICD-10-CM

## 2021-08-16 DIAGNOSIS — M7061 Trochanteric bursitis, right hip: Secondary | ICD-10-CM

## 2021-08-16 DIAGNOSIS — D1330 Benign neoplasm of unspecified part of small intestine: Secondary | ICD-10-CM

## 2021-08-16 DIAGNOSIS — D472 Monoclonal gammopathy: Secondary | ICD-10-CM

## 2021-08-16 DIAGNOSIS — M19042 Primary osteoarthritis, left hand: Secondary | ICD-10-CM

## 2021-08-16 DIAGNOSIS — Z79899 Other long term (current) drug therapy: Secondary | ICD-10-CM
